# Patient Record
Sex: Female | Born: 1985 | Race: Black or African American | Hispanic: No | Marital: Single | Smoking: Never smoker
Health system: Southern US, Community
[De-identification: ages and names within clinical notes are randomized; demographics above are authoritative.]

## PROBLEM LIST (undated history)

## (undated) DIAGNOSIS — J45909 Unspecified asthma, uncomplicated: Secondary | ICD-10-CM

## (undated) DIAGNOSIS — D649 Anemia, unspecified: Secondary | ICD-10-CM

## (undated) DIAGNOSIS — G43909 Migraine, unspecified, not intractable, without status migrainosus: Secondary | ICD-10-CM

## (undated) DIAGNOSIS — D573 Sickle-cell trait: Secondary | ICD-10-CM

## (undated) HISTORY — PX: ABDOMINAL HYSTERECTOMY: SHX81

## (undated) HISTORY — PX: FOOT TENDON SURGERY: SHX958

---
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
## 2007-03-18 ENCOUNTER — Emergency Department: Payer: Self-pay | Admitting: Internal Medicine

## 2008-03-30 ENCOUNTER — Observation Stay: Payer: Self-pay | Admitting: Obstetrics and Gynecology

## 2008-07-07 ENCOUNTER — Inpatient Hospital Stay: Payer: Self-pay | Admitting: Obstetrics and Gynecology

## 2009-05-05 ENCOUNTER — Emergency Department: Payer: Self-pay | Admitting: Emergency Medicine

## 2009-05-18 ENCOUNTER — Emergency Department: Payer: Self-pay | Admitting: Emergency Medicine

## 2010-07-28 HISTORY — PX: DIAGNOSTIC LAPAROSCOPY: SUR761

## 2010-10-16 ENCOUNTER — Ambulatory Visit: Payer: Self-pay

## 2010-10-22 ENCOUNTER — Ambulatory Visit: Payer: Self-pay

## 2011-12-17 ENCOUNTER — Emergency Department: Payer: Self-pay | Admitting: *Deleted

## 2013-03-08 ENCOUNTER — Emergency Department: Payer: Self-pay | Admitting: Emergency Medicine

## 2013-03-08 LAB — CBC
HGB: 13.8 g/dL (ref 12.0–16.0)
MCHC: 35.8 g/dL (ref 32.0–36.0)
MCV: 90 fL (ref 80–100)
RBC: 4.3 10*6/uL (ref 3.80–5.20)
RDW: 12.2 % (ref 11.5–14.5)

## 2013-03-08 LAB — URINALYSIS, COMPLETE
RBC,UR: 17533 /HPF (ref 0–5)
Squamous Epithelial: 3
WBC UR: 8 /HPF (ref 0–5)

## 2013-03-08 LAB — GC/CHLAMYDIA PROBE AMP

## 2013-05-11 ENCOUNTER — Emergency Department: Payer: Self-pay | Admitting: Emergency Medicine

## 2013-05-11 LAB — URINALYSIS, COMPLETE
Bilirubin,UR: NEGATIVE
Ketone: NEGATIVE
Leukocyte Esterase: NEGATIVE
Squamous Epithelial: <1

## 2014-02-28 ENCOUNTER — Emergency Department: Payer: Self-pay | Admitting: Emergency Medicine

## 2014-04-18 DIAGNOSIS — E669 Obesity, unspecified: Secondary | ICD-10-CM | POA: Insufficient documentation

## 2014-04-18 DIAGNOSIS — R51 Headache: Secondary | ICD-10-CM

## 2014-04-18 DIAGNOSIS — R519 Headache, unspecified: Secondary | ICD-10-CM | POA: Insufficient documentation

## 2014-04-18 HISTORY — DX: Obesity, unspecified: E66.9

## 2014-05-10 DIAGNOSIS — G43909 Migraine, unspecified, not intractable, without status migrainosus: Secondary | ICD-10-CM | POA: Insufficient documentation

## 2014-11-14 ENCOUNTER — Emergency Department
Admit: 2014-11-14 | Disposition: A | Discharge status: Home / Self Care | Payer: Self-pay | Admitting: Emergency Medicine

## 2014-11-14 LAB — CBC WITH DIFFERENTIAL/PLATELET
BASOS ABS: 0 10*3/uL (ref 0.0–0.1)
Basophil %: 0.6 %
Eosinophil #: 0.1 10*3/uL (ref 0.0–0.7)
Eosinophil %: 1.3 %
HCT: 36.3 % (ref 35.0–47.0)
HGB: 12.3 g/dL (ref 12.0–16.0)
LYMPHS PCT: 17.8 %
Lymphocyte #: 1.4 10*3/uL (ref 1.0–3.6)
MCH: 31 pg (ref 26.0–34.0)
MCHC: 33.8 g/dL (ref 32.0–36.0)
MCV: 92 fL (ref 80–100)
MONOS PCT: 8.6 %
Monocyte #: 0.7 x10 3/mm (ref 0.2–0.9)
Neutrophil #: 5.5 10*3/uL (ref 1.4–6.5)
Neutrophil %: 71.7 %
PLATELETS: 194 10*3/uL (ref 150–440)
RBC: 3.96 10*6/uL (ref 3.80–5.20)
RDW: 12.6 % (ref 11.5–14.5)
WBC: 7.6 10*3/uL (ref 3.6–11.0)

## 2014-11-14 LAB — ED INFLUENZA
Influenza A By PCR: NEGATIVE
Influenza B By PCR: NEGATIVE

## 2014-11-17 LAB — BETA STREP CULTURE(ARMC)

## 2015-06-07 ENCOUNTER — Emergency Department: Payer: Medicaid Other

## 2015-06-07 ENCOUNTER — Encounter: Payer: Self-pay | Admitting: Emergency Medicine

## 2015-06-07 ENCOUNTER — Emergency Department
Admission: EM | Admit: 2015-06-07 | Discharge: 2015-06-07 | Disposition: A | Discharge status: Home / Self Care | Payer: Medicaid Other | Attending: Emergency Medicine | Admitting: Emergency Medicine

## 2015-06-07 DIAGNOSIS — R109 Unspecified abdominal pain: Secondary | ICD-10-CM | POA: Insufficient documentation

## 2015-06-07 DIAGNOSIS — G43709 Chronic migraine without aura, not intractable, without status migrainosus: Secondary | ICD-10-CM | POA: Diagnosis not present

## 2015-06-07 DIAGNOSIS — R079 Chest pain, unspecified: Secondary | ICD-10-CM | POA: Diagnosis not present

## 2015-06-07 DIAGNOSIS — M79602 Pain in left arm: Secondary | ICD-10-CM | POA: Diagnosis not present

## 2015-06-07 DIAGNOSIS — G43909 Migraine, unspecified, not intractable, without status migrainosus: Secondary | ICD-10-CM | POA: Diagnosis present

## 2015-06-07 DIAGNOSIS — IMO0002 Reserved for concepts with insufficient information to code with codable children: Secondary | ICD-10-CM

## 2015-06-07 HISTORY — DX: Sickle-cell trait: D57.3

## 2015-06-07 HISTORY — DX: Migraine, unspecified, not intractable, without status migrainosus: G43.909

## 2015-06-07 HISTORY — DX: Unspecified asthma, uncomplicated: J45.909

## 2015-06-07 LAB — CBC
HCT: 37.5 % (ref 35.0–47.0)
Hemoglobin: 12.6 g/dL (ref 12.0–16.0)
MCH: 30.7 pg (ref 26.0–34.0)
MCHC: 33.6 g/dL (ref 32.0–36.0)
MCV: 91.4 fL (ref 80.0–100.0)
PLATELETS: 260 10*3/uL (ref 150–440)
RBC: 4.11 MIL/uL (ref 3.80–5.20)
RDW: 12.5 % (ref 11.5–14.5)
WBC: 4.5 10*3/uL (ref 3.6–11.0)

## 2015-06-07 LAB — COMPREHENSIVE METABOLIC PANEL
ALT: 10 U/L — ABNORMAL LOW (ref 14–54)
AST: 13 U/L — ABNORMAL LOW (ref 15–41)
Albumin: 4 g/dL (ref 3.5–5.0)
Alkaline Phosphatase: 56 U/L (ref 38–126)
Anion gap: 3 — ABNORMAL LOW (ref 5–15)
BILIRUBIN TOTAL: 0.6 mg/dL (ref 0.3–1.2)
BUN: 9 mg/dL (ref 6–20)
CHLORIDE: 109 mmol/L (ref 101–111)
CO2: 25 mmol/L (ref 22–32)
Calcium: 9.1 mg/dL (ref 8.9–10.3)
Creatinine, Ser: 0.89 mg/dL (ref 0.44–1.00)
Glucose, Bld: 81 mg/dL (ref 65–99)
POTASSIUM: 3.8 mmol/L (ref 3.5–5.1)
Sodium: 137 mmol/L (ref 135–145)
TOTAL PROTEIN: 7.1 g/dL (ref 6.5–8.1)

## 2015-06-07 LAB — TROPONIN I

## 2015-06-07 MED ORDER — KETOROLAC TROMETHAMINE 60 MG/2ML IM SOLN
60.0000 mg | Freq: Once | INTRAMUSCULAR | Status: AC
  Administered 2015-06-07: 60 mg via INTRAMUSCULAR
  Filled 2015-06-07: qty 2

## 2015-06-07 MED ORDER — KETOROLAC TROMETHAMINE 30 MG/ML IJ SOLN
30.0000 mg | Freq: Once | INTRAMUSCULAR | Status: DC
  Filled 2015-06-07: qty 1

## 2015-06-07 NOTE — ED Notes (Signed)
pt c/o migrane to left side, left sided chest pain, left sided abdominal pain and left arm pain X 2 days. Pt hx of migraines, sees a neurologist but "feels like a [place] with medications". Pt states that no medications she is taking are helping her migraines. Pt alert and oriented X4, active, cooperative, pt in NAD. RR even and unlabored, color WNL.  Pt blinking rapidly during conversation.

## 2015-06-07 NOTE — Discharge Instructions (Signed)
As we discussed your CT scan and lab work is all reassuring.  We recommend that you follow up with Dr. Malvin JohnsPotter at the next available opportunity and continue taking your regular medications.  Return to the emergency department with new or worsening symptoms that concern you.   Recurrent Migraine Headache A migraine headache is an intense, throbbing pain on one or both sides of your head. Recurrent migraines keep coming back. A migraine can last for 30 minutes to several hours. CAUSES  The exact cause of a migraine headache is not always known. However, a migraine may be caused when nerves in the brain become irritated and release chemicals that cause inflammation. This causes pain. Certain things may also trigger migraines, such as:   Alcohol.  Smoking.  Stress.  Menstruation.  Aged cheeses.  Foods or drinks that contain nitrates, glutamate, aspartame, or tyramine.  Lack of sleep.  Chocolate.  Caffeine.  Hunger.  Physical exertion.  Fatigue.  Medicines used to treat chest pain (nitroglycerine), birth control pills, estrogen, and some blood pressure medicines. SYMPTOMS   Pain on one or both sides of your head.  Pulsating or throbbing pain.  Severe pain that prevents daily activities.  Pain that is aggravated by any physical activity.  Nausea, vomiting, or both.  Dizziness.  Pain with exposure to bright lights, loud noises, or activity.  General sensitivity to bright lights, loud noises, or smells. Before you get a migraine, you may get warning signs that a migraine is coming (aura). An aura may include:  Seeing flashing lights.  Seeing bright spots, halos, or zigzag lines.  Having tunnel vision or blurred vision.  Having feelings of numbness or tingling.  Having trouble talking.  Having muscle weakness. DIAGNOSIS  A recurrent migraine headache is often diagnosed based on:  Symptoms.  Physical examination.  A CT scan or MRI of your head. These  imaging tests cannot diagnose migraines but can help rule out other causes of headaches.  TREATMENT  Medicines may be given for pain and nausea. Medicines can also be given to help prevent recurrent migraines. HOME CARE INSTRUCTIONS  Only take over-the-counter or prescription medicines for pain or discomfort as directed by your health care provider. The use of long-term narcotics is not recommended.  Lie down in a dark, quiet room when you have a migraine.  Keep a journal to find out what may trigger your migraine headaches. For example, write down:  What you eat and drink.  How much sleep you get.  Any change to your diet or medicines.  Limit alcohol consumption.  Quit smoking if you smoke.  Get 7-9 hours of sleep, or as recommended by your health care provider.  Limit stress.  Keep lights dim if bright lights bother you and make your migraines worse. SEEK MEDICAL CARE IF:   You do not get relief from the medicines given to you.  You have a recurrence of pain.  You have a fever. SEEK IMMEDIATE MEDICAL CARE IF:  Your migraine becomes severe.  You have a stiff neck.  You have loss of vision.  You have muscular weakness or loss of muscle control.  You start losing your balance or have trouble walking.  You feel faint or pass out.  You have severe symptoms that are different from your first symptoms. MAKE SURE YOU:   Understand these instructions.  Will watch your condition.  Will get help right away if you are not doing well or get worse.   This information  is not intended to replace advice given to you by your health care provider. Make sure you discuss any questions you have with your health care provider.   Document Released: 04/08/2001 Document Revised: 08/04/2014 Document Reviewed: 03/21/2013 Elsevier Interactive Patient Education [employer].

## 2015-06-07 NOTE — ED Notes (Signed)
Patient transported to CT 

## 2015-06-07 NOTE — ED Provider Notes (Signed)
[facility] Emergency Department Provider Note  ____________________________________________  Time seen: Approximately 7:02 PM  I have reviewed the triage vital signs and the nursing notes.   HISTORY  Chief Complaint Migraine; Chest Pain; and Abdominal Pain    HPI Danielle Pollard is a 29 y.o. female who presents with chronic migraines.  She states that she has been suffering from migraines since she was 29 years old.  She sees Dr. Malvin Johns who has been trying multiple different medications to control her chronic headaches but she says that nothing is working.  She states that she is here today not because anything is new or worse than usual, but because she is frustrated and just "wanted to know what is going on".  She says that she has a headache every day and occasional episodes of nausea and vomiting.  She has been taking the medications that Dr. Malvin Johns as prescribed.  Nothing makes them better and nothing makes them worse set that she says that being a single parent causes extra stress that seems to exacerbate the headaches.  She describes them as a global, mild to severe in intensity, and persistent for "a long time".  She also states almost incidentally that she is occasionally feeling some pain on the left side of her body, including her chest, arm, and abdomen.   Past Medical History  Diagnosis Date  . Asthma   . Migraines   . Sickle cell trait (HCC)     There are no active problems to display for this patient.   Past Surgical History  Procedure Laterality Date  . Cesarean section      No current outpatient prescriptions on file.  Allergies Review of patient's allergies indicates no known allergies.  No family history on file.  Social History Social History  Substance Use Topics  . Smoking status: Never Smoker   . Smokeless tobacco: None  . Alcohol Use: No    Review of Systems Constitutional: No fever/chills Eyes: No visual  changes. ENT: No sore throat. Cardiovascular: Denies chest pain. Respiratory: Denies shortness of breath. Gastrointestinal: No abdominal pain.  Occasional vomiting associated with the headaches.  No diarrhea.  No constipation. Genitourinary: Negative for dysuria. Musculoskeletal: Negative for back pain. Skin: Negative for rash. Neurological: Chronic/constant migraines, global, severe.  10-point ROS otherwise negative.  ____________________________________________   PHYSICAL EXAM:  VITAL SIGNS: ED Triage Vitals  Enc Vitals Group     BP 06/07/15 1635 132/85 mmHg     Pulse Rate 06/07/15 1635 79     Resp 06/07/15 1635 18     Temp 06/07/15 1635 98.4 F (36.9 C)     Temp Source 06/07/15 1635 Oral     SpO2 06/07/15 1635 100 %     Weight 06/07/15 1635 176 lb (79.833 kg)     Height 06/07/15 1635  (1.626 m)     Head Cir --      Peak Flow --      Pain Score 06/07/15 1636 10     Pain Loc --      Pain Edu? --      Excl. in GC? --     Constitutional: Alert and oriented. Well appearing and in no acute distress. Eyes: Conjunctivae are normal. PERRL. EOMI. Head: Atraumatic. Nose: No congestion/rhinnorhea. Mouth/Throat: Mucous membranes are moist.  Oropharynx non-erythematous. Neck: No stridor.   Cardiovascular: Normal rate, regular rhythm. Grossly normal heart sounds.  Good peripheral circulation. Respiratory: Normal respiratory effort.  No retractions. Lungs  CTAB. Gastrointestinal: Soft and nontender. No distention. No abdominal bruits. No CVA tenderness. Musculoskeletal: No lower extremity tenderness nor edema.  No joint effusions. Neurologic:  Normal speech and language. No gross focal neurologic deficits are appreciated.  Skin:  Skin is warm, dry and intact. No rash noted. Psychiatric: Mood and affect are normal. Speech and behavior are normal.  ____________________________________________   LABS (all labs ordered are listed, but only abnormal results are  displayed)  Labs Reviewed  COMPREHENSIVE METABOLIC PANEL - Abnormal; Notable for the following:    AST 13 (*)    ALT 10 (*)    Anion gap 3 (*)    All other components within normal limits  CBC  TROPONIN I   ____________________________________________  EKG  ED ECG REPORT I, Fraida Veldman, the attending physician, personally viewed and interpreted this ECG.  Date: 06/07/2015 EKG Time: 16:35 Rate: 75 Rhythm: normal sinus rhythm QRS Axis: normal Intervals: normal ST/T Wave abnormalities: Inverted T-wave in lead 3, otherwise unremarkable Conduction Disutrbances: none Narrative Interpretation: unremarkable  ____________________________________________  RADIOLOGY   Ct Head Wo Contrast  06/07/2015  CLINICAL DATA:  Migraine headache for 2 days. EXAM: CT HEAD WITHOUT CONTRAST TECHNIQUE: Contiguous axial images were obtained from the base of the skull through the vertex without intravenous contrast. COMPARISON:  12/17/2011 FINDINGS: Gray-white differentiation is maintained. No CT evidence of acute large territory infarct. No intraparenchymal or extra-axial mass or hemorrhage. Unchanged size and configuration of the ventricles and basilar cisterns. No midline shift. Limited visualization of the paranasal sinuses and mastoid air cells is normal. No air-fluid levels. Regional soft tissues appear normal. No displaced calvarial fracture. IMPRESSION: Negative noncontrast head CT. Electronically Signed   By: Simonne ComeJohn  Watts M.D.   On: 06/07/2015 19:33    ____________________________________________   PROCEDURES  Procedure(s) performed: None  Critical Care performed: No ____________________________________________   INITIAL IMPRESSION / ASSESSMENT AND PLAN / ED COURSE  Pertinent labs & imaging results that were available during my care of the patient were reviewed by me and considered in my medical decision making (see chart for details).  The patient states that she is worried that  something is going on inside her head "like a tumor" because she has not had any imaging throughout all the different treatments.  I think it is reasonable to obtain a CT scan of her head and we will do so here tonight.  I will treat her with Toradol for her headache but she drove herself here and has her son with her and I am not comfortable giving her the full migraine cocktail which frequently results and somnolence; I think it might be dangerous for her to drive her and her son home afterwards.  I explained this to the patient and she understands and agrees.  If her CT scan is unremarkable she can follow-up with Dr. Malvin JohnsPotter.  I explained the chronic nature of the symptoms and it is unlikely we would identify a cause in the emergency department today.  She has no focal neurological deficits, no visual disturbances, or other symptoms/signs that would lead me to believe she has an acute or emergent medical condition.  ____________________________________________  FINAL CLINICAL IMPRESSION(S) / ED DIAGNOSES  Final diagnoses:  Chronic migraine      NEW MEDICATIONS STARTED DURING THIS VISIT:  New Prescriptions   No medications on file     Loleta Roseory Aviana Shevlin, MD 06/07/15 2020

## 2015-06-07 NOTE — ED Notes (Signed)
Pt states no chance of being pregnant

## 2015-06-07 NOTE — ED Notes (Signed)
Pt to ED with c/o migraine headache x 2 day with n,v, hx of migraines, taking migraine medication with no relief, also states yesterday she started having left sided chest pain and lightheadiness

## 2015-09-09 ENCOUNTER — Emergency Department
Admission: EM | Admit: 2015-09-09 | Discharge: 2015-09-09 | Disposition: A | Discharge status: Home / Self Care | Payer: Medicaid Other | Attending: Emergency Medicine | Admitting: Emergency Medicine

## 2015-09-09 ENCOUNTER — Encounter: Payer: Self-pay | Admitting: *Deleted

## 2015-09-09 DIAGNOSIS — N390 Urinary tract infection, site not specified: Secondary | ICD-10-CM | POA: Diagnosis not present

## 2015-09-09 DIAGNOSIS — R1030 Lower abdominal pain, unspecified: Secondary | ICD-10-CM | POA: Diagnosis present

## 2015-09-09 DIAGNOSIS — B349 Viral infection, unspecified: Secondary | ICD-10-CM | POA: Diagnosis not present

## 2015-09-09 LAB — CBC
HCT: 37.8 % (ref 35.0–47.0)
HEMOGLOBIN: 13 g/dL (ref 12.0–16.0)
MCH: 31 pg (ref 26.0–34.0)
MCHC: 34.4 g/dL (ref 32.0–36.0)
MCV: 90.1 fL (ref 80.0–100.0)
PLATELETS: 217 10*3/uL (ref 150–440)
RBC: 4.2 MIL/uL (ref 3.80–5.20)
RDW: 12 % (ref 11.5–14.5)
WBC: 7.2 10*3/uL (ref 3.6–11.0)

## 2015-09-09 LAB — URINALYSIS COMPLETE WITH MICROSCOPIC (ARMC ONLY)
BILIRUBIN URINE: NEGATIVE
Glucose, UA: NEGATIVE mg/dL
Hgb urine dipstick: NEGATIVE
KETONES UR: NEGATIVE mg/dL
NITRITE: NEGATIVE
PH: 6 (ref 5.0–8.0)
Protein, ur: NEGATIVE mg/dL
Specific Gravity, Urine: 1.014 (ref 1.005–1.030)

## 2015-09-09 LAB — COMPREHENSIVE METABOLIC PANEL
ALBUMIN: 3.8 g/dL (ref 3.5–5.0)
ALK PHOS: 56 U/L (ref 38–126)
ALT: 8 U/L — AB (ref 14–54)
ANION GAP: 7 (ref 5–15)
AST: 13 U/L — ABNORMAL LOW (ref 15–41)
BILIRUBIN TOTAL: 0.5 mg/dL (ref 0.3–1.2)
BUN: 6 mg/dL (ref 6–20)
CALCIUM: 9 mg/dL (ref 8.9–10.3)
CO2: 23 mmol/L (ref 22–32)
CREATININE: 0.91 mg/dL (ref 0.44–1.00)
Chloride: 108 mmol/L (ref 101–111)
GFR calc Af Amer: >60 mL/min (ref >60)
GFR calc non Af Amer: >60 mL/min (ref >60)
GLUCOSE: 91 mg/dL (ref 65–99)
Potassium: 3.7 mmol/L (ref 3.5–5.1)
Sodium: 138 mmol/L (ref 135–145)
TOTAL PROTEIN: 7.5 g/dL (ref 6.5–8.1)

## 2015-09-09 LAB — LIPASE, BLOOD: Lipase: 17 U/L (ref 11–51)

## 2015-09-09 MED ORDER — ONDANSETRON 4 MG PO TBDP
4.0000 mg | ORAL_TABLET | Freq: Once | ORAL | Status: AC | PRN
  Administered 2015-09-09: 4 mg via ORAL

## 2015-09-09 MED ORDER — ONDANSETRON 4 MG PO TBDP
ORAL_TABLET | ORAL | Status: AC
  Filled 2015-09-09: qty 1

## 2015-09-09 MED ORDER — ONDANSETRON 4 MG PO TBDP: 4.0000 mg | ORAL_TABLET | Freq: Three times a day (TID) | ORAL | Status: DC | PRN

## 2015-09-09 MED ORDER — CEPHALEXIN 500 MG PO CAPS: 500.0000 mg | ORAL_CAPSULE | Freq: Three times a day (TID) | ORAL | Status: DC

## 2015-09-09 NOTE — ED Notes (Addendum)
States body aches, chills, headache, congestion, and fever and vomiting since Friday, states abd pain since yesterday

## 2015-09-09 NOTE — ED Provider Notes (Addendum)
[facility] Emergency Department Provider Note  Time seen: 4:33 PM  I have reviewed the triage vital signs and the nursing notes.   HISTORY  Chief Complaint Generalized Body Aches; Chills; Emesis; and Abdominal Pain    HPI Danielle Pollard is a 30 y.o. female with a past medical history of asthma, migraines, presents the emergency department with 3 days of subjective fevers, chills, nausea, nasal congestion, and some lower abdominal pain. According to the patient for the past 3 days she has been having subjective fevers/chills, nausea and vomiting with nasal congestion and a sore throat. Patient also states since yesterday she has been having some lower abdominal discomfort. Denies diarrhea. Denies vomiting. Denies dysuria. Describes her abdominal pain is mild, cramping sensation to the lower abdomen. Scratch her sore throat is mild, worse when she swallows.     Past Medical History  Diagnosis Date  . Asthma   . Migraines   . Sickle cell trait (HCC)     There are no active problems to display for this patient.   Past Surgical History  Procedure Laterality Date  . Cesarean section      No current outpatient prescriptions on file.  Allergies Review of patient's allergies indicates no known allergies.  History reviewed. No pertinent family history.  Social History Social History  Substance Use Topics  . Smoking status: Never Smoker   . Smokeless tobacco: None  . Alcohol Use: No    Review of Systems Constitutional: Negative for fever. ENT: Positive for nasal congestion. Cardiovascular: Negative for chest pain. Respiratory: Negative for shortness of breath. Negative for cough. Gastrointestinal: Mild lower abdominal pain. Positive for nausea. Negative for vomiting or diarrhea. Genitourinary: Negative for dysuria. Neurological: Mild headache. 10-point ROS otherwise negative.  ____________________________________________   PHYSICAL  EXAM:  VITAL SIGNS: ED Triage Vitals  Enc Vitals Group     BP 09/09/15 1450 129/73 mmHg     Pulse Rate 09/09/15 1450 81     Resp 09/09/15 1450 18     Temp 09/09/15 1450 98.5 F (36.9 C)     Temp Source 09/09/15 1450 Oral     SpO2 09/09/15 1450 98 %     Weight 09/09/15 1450 180 lb (81.647 kg)     Height 09/09/15 1450  (1.651 m)     Head Cir --      Peak Flow --      Pain Score 09/09/15 1451 10     Pain Loc --      Pain Edu? --      Excl. in GC? --     Constitutional: Alert and oriented. Well appearing and in no distress. Eyes: Normal exam ENT   Head: Normocephalic and atraumatic.   Nose: Mild congestion.   Mouth/Throat: Mucous membranes are moist. No pharyngeal erythema or hypertrophy. Cardiovascular: Normal rate, regular rhythm. No murmur Respiratory: Normal respiratory effort without tachypnea nor retractions. Breath sounds are clear Gastrointestinal: Soft, slight suprapubic tenderness palpation. No rebound or guarding. No distention. No CVA tenderness. Musculoskeletal: Nontender with normal range of motion in all extremities. Neurologic:  Normal speech and language. No gross focal neurologic deficits Skin:  Skin is warm, dry and intact.  Psychiatric: Mood and affect are normal. Speech and behavior are normal.   ____________________________________________    INITIAL IMPRESSION / ASSESSMENT AND PLAN / ED COURSE  Pertinent labs & imaging results that were available during my care of the patient were reviewed by me and considered in my medical  decision making (see chart for details).  Patient presents with 3 days of subjective fevers/chills, nausea, some lower abdominal discomfort and nasal congestion with sore throat. Patient's symptoms are most suggestive of a viral process likely upper respiratory infection. Patient's labs show many bacteria, possible contamination urinalysis however given her mild lower abdominal pain we will discharge with antibiotic  cover for urinary tract infection. I sent a urine culture to verify. We will treat the patient's nausea was Zofran as needed, suspect viral process discussed with the patient drinking plenty of fluids, Tylenol or Motrin as needed for discomfort and obtaining plain press. Patient is agreeable.  ____________________________________________   FINAL CLINICAL IMPRESSION(S) / ED DIAGNOSES  Viral syndrome UTI   Minna Antis, MD 09/09/15 1636  Minna Antis, MD 09/09/15 [phone]

## 2015-09-09 NOTE — Discharge Instructions (Signed)
Viral Infections A viral infection can be caused by different types of viruses.Most viral infections are not serious and resolve on their own. However, some infections may cause severe symptoms and may lead to further complications. SYMPTOMS Viruses can frequently cause:  Minor sore throat.  Aches and pains.  Headaches.  Runny nose.  Different types of rashes.  Watery eyes.  Tiredness.  Cough.  Loss of appetite.  Gastrointestinal infections, resulting in nausea, vomiting, and diarrhea. These symptoms do not respond to antibiotics because the infection is not caused by bacteria. However, you might catch a bacterial infection following the viral infection. This is sometimes called a "superinfection." Symptoms of such a bacterial infection may include:  Worsening sore throat with pus and difficulty swallowing.  Swollen neck glands.  Chills and a high or persistent fever.  Severe headache.  Tenderness over the sinuses.  Persistent overall ill feeling (malaise), muscle aches, and tiredness (fatigue).  Persistent cough.  Yellow, green, or brown mucus production with coughing. HOME CARE INSTRUCTIONS   Only take over-the-counter or prescription medicines for pain, discomfort, diarrhea, or fever as directed by your caregiver.  Drink enough water and fluids to keep your urine clear or pale yellow. Sports drinks can provide valuable electrolytes, sugars, and hydration.  Get plenty of rest and maintain proper nutrition. Soups and broths with crackers or rice are fine. SEEK IMMEDIATE MEDICAL CARE IF:   You have severe headaches, shortness of breath, chest pain, neck pain, or an unusual rash.  You have uncontrolled vomiting, diarrhea, or you are unable to keep down fluids.  You or your child has an oral temperature above 102 F (38.9 C), not controlled by medicine.  Your baby is older than 3 months with a rectal temperature of 102 F (38.9 C) or higher.  Your baby is 50  months old or younger with a rectal temperature of 100.4 F (38 C) or higher. MAKE SURE YOU:   Understand these instructions.  Will watch your condition.  Will get help right away if you are not doing well or get worse.   This information is not intended to replace advice given to you by your health care provider. Make sure you discuss any questions you have with your health care provider.   Document Released: 04/23/2005 Document Revised: 10/06/2011 Document Reviewed: 12/20/2014 Elsevier Interactive Patient Education 2016 [employer]  Urinary Tract Infection Urinary tract infections (UTIs) can develop anywhere along your urinary tract. Your urinary tract is your body's drainage system for removing wastes and extra water. Your urinary tract includes two kidneys, two ureters, a bladder, and a urethra. Your kidneys are a pair of bean-shaped organs. Each kidney is about the size of your fist. They are located below your ribs, one on each side of your spine. CAUSES Infections are caused by microbes, which are microscopic organisms, including fungi, viruses, and bacteria. These organisms are so small that they can only be seen through a microscope. Bacteria are the microbes that most commonly cause UTIs. SYMPTOMS  Symptoms of UTIs may vary by age and gender of the patient and by the location of the infection. Symptoms in young women typically include a frequent and intense urge to urinate and a painful, burning feeling in the bladder or urethra during urination. Older women and men are more likely to be tired, shaky, and weak and have muscle aches and abdominal pain. A fever may mean the infection is in your kidneys. Other symptoms of a kidney infection include pain in  your back or sides below the ribs, nausea, and vomiting. DIAGNOSIS To diagnose a UTI, your caregiver will ask you about your symptoms. Your caregiver will also ask you to provide a urine sample. The urine sample will be tested for  bacteria and white blood cells. White blood cells are made by your body to help fight infection. TREATMENT  Typically, UTIs can be treated with medication. Because most UTIs are caused by a bacterial infection, they usually can be treated with the use of antibiotics. The choice of antibiotic and length of treatment depend on your symptoms and the type of bacteria causing your infection. HOME CARE INSTRUCTIONS  If you were prescribed antibiotics, take them exactly as your caregiver instructs you. Finish the medication even if you feel better after you have only taken some of the medication.  Drink enough water and fluids to keep your urine clear or pale yellow.  Avoid caffeine, tea, and carbonated beverages. They tend to irritate your bladder.  Empty your bladder often. Avoid holding urine for long periods of time.  Empty your bladder before and after sexual intercourse.  After a bowel movement, women should cleanse from front to back. Use each tissue only once. SEEK MEDICAL CARE IF:   You have back pain.  You develop a fever.  Your symptoms do not begin to resolve within 3 days. SEEK IMMEDIATE MEDICAL CARE IF:   You have severe back pain or lower abdominal pain.  You develop chills.  You have nausea or vomiting.  You have continued burning or discomfort with urination. MAKE SURE YOU:   Understand these instructions.  Will watch your condition.  Will get help right away if you are not doing well or get worse.   This information is not intended to replace advice given to you by your health care provider. Make sure you discuss any questions you have with your health care provider.   Document Released: 04/23/2005 Document Revised: 04/04/2015 Document Reviewed: 08/22/2011 Elsevier Interactive Patient Education [employer]

## 2015-12-12 ENCOUNTER — Emergency Department
Admission: EM | Admit: 2015-12-12 | Discharge: 2015-12-12 | Disposition: A | Discharge status: Home / Self Care | Payer: Medicaid Other | Attending: Student | Admitting: Student

## 2015-12-12 ENCOUNTER — Encounter: Payer: Self-pay | Admitting: Emergency Medicine

## 2015-12-12 DIAGNOSIS — J45909 Unspecified asthma, uncomplicated: Secondary | ICD-10-CM | POA: Insufficient documentation

## 2015-12-12 DIAGNOSIS — L209 Atopic dermatitis, unspecified: Secondary | ICD-10-CM | POA: Diagnosis not present

## 2015-12-12 DIAGNOSIS — R21 Rash and other nonspecific skin eruption: Secondary | ICD-10-CM | POA: Diagnosis present

## 2015-12-12 MED ORDER — HYDROCORTISONE 0.5 % EX CREA: 1.0000 "application " | TOPICAL_CREAM | Freq: Two times a day (BID) | CUTANEOUS | Status: DC

## 2015-12-12 MED ORDER — HYDROXYZINE PAMOATE 25 MG PO CAPS: 25.0000 mg | ORAL_CAPSULE | Freq: Three times a day (TID) | ORAL | Status: DC | PRN

## 2015-12-12 MED ORDER — METHYLPREDNISOLONE SODIUM SUCC 125 MG IJ SOLR
125.0000 mg | Freq: Once | INTRAMUSCULAR | Status: AC
  Administered 2015-12-12: 125 mg via INTRAMUSCULAR
  Filled 2015-12-12: qty 2

## 2015-12-12 MED ORDER — PREDNISONE 10 MG PO TABS: 50.0000 mg | ORAL_TABLET | Freq: Every day | ORAL | Status: DC

## 2015-12-12 NOTE — ED Provider Notes (Signed)
[facility] Emergency Department Provider Note  ____________________________________________  Time seen: Approximately 6:47 PM  I have reviewed the triage vital signs and the nursing notes.   HISTORY  Chief Complaint Rash    HPI Danielle Pollard is a 30 y.o. female who presents for evaluation of a rash about a month with itching. Denies any new foods. Thinks her mom may change detergents on her. Has tried Benadryl and right ear ointment over-the-counter with no relief. States the itching is constant keeping her awake at nighttime.   Past Medical History  Diagnosis Date  . Asthma   . Migraines   . Sickle cell trait (HCC)     There are no active problems to display for this patient.   Past Surgical History  Procedure Laterality Date  . Cesarean section      Current Outpatient Rx  Name  Route  Sig  Dispense  Refill  . hydrocortisone cream 0.5 %   Topical   Apply 1 application topically 2 (two) times daily.   30 g   0   . hydrOXYzine (VISTARIL) 25 MG capsule   Oral   Take 1 capsule (25 mg total) by mouth 3 (three) times daily as needed.   30 capsule   0   . predniSONE (DELTASONE) 10 MG tablet   Oral   Take 5 tablets (50 mg total) by mouth daily with breakfast.   25 tablet   0     Allergies Review of patient's allergies indicates no known allergies.  No family history on file.  Social History Social History  Substance Use Topics  . Smoking status: Never Smoker   . Smokeless tobacco: None  . Alcohol Use: No    Review of Systems Constitutional: No fever/chills Cardiovascular: Denies chest pain. Respiratory: Denies shortness of breath. Skin: Positive for rash. Neurological: Negative for headaches, focal weakness or numbness.  10-point ROS otherwise negative.  ____________________________________________   PHYSICAL EXAM:  VITAL SIGNS: ED Triage Vitals  Enc Vitals Group     BP 12/12/15 1839 148/93 mmHg     Pulse  Rate 12/12/15 1839 77     Resp 12/12/15 1839 18     Temp 12/12/15 1839 97.9 F (36.6 C)     Temp Source 12/12/15 1839 Oral     SpO2 12/12/15 1839 99 %     Weight 12/12/15 1839 180 lb (81.647 kg)     Height 12/12/15 1839  (1.651 m)     Head Cir --      Peak Flow --      Pain Score --      Pain Loc --      Pain Edu? --      Excl. in GC? --     Constitutional: Alert and oriented. Well appearing and in no acute distress. Cardiovascular: Normal rate, regular rhythm. Grossly normal heart sounds.  Good peripheral circulation. Respiratory: Normal respiratory effort.  No retractions. Lungs CTAB. Gastrointestinal: Soft and nontender. No distention. No abdominal bruits. No CVA tenderness. Musculoskeletal: No lower extremity tenderness nor edema.  No joint effusions. Neurologic:  Normal speech and language. No gross focal neurologic deficits are appreciated. No gait instability. Skin:  Skin is warm, dry and intact.Very fine pruritic papular nonvesicular rash noted on the arms legs and trunk. Psychiatric: Mood and affect are normal. Speech and behavior are normal.  ____________________________________________   LABS (all labs ordered are listed, but only abnormal results are displayed)  Labs Reviewed - No  data to display ____________________________________________     PROCEDURES  Procedure(s) performed: None  Critical Care performed: No  ____________________________________________   INITIAL IMPRESSION / ASSESSMENT AND PLAN / ED COURSE  Pertinent labs & imaging results that were available during my care of the patient were reviewed by me and considered in my medical decision making (see chart for details).  Atopic dermatitis. Rx given for prednisone daily 5 days. Vistaril and hydrocortisone cream as needed for itching. Patient follow-up with PCP or return to the ER with any worsening symptomology. ____________________________________________   FINAL CLINICAL  IMPRESSION(S) / ED DIAGNOSES  Final diagnoses:  Atopic dermatitis     This chart was dictated using voice recognition software/Dragon. Despite best efforts to proofread, errors can occur which can change the meaning. Any change was purely unintentional.   Evangeline Dakinharles M Star Cheese, PA-C 12/12/15 1912  Gayla DossEryka A Gayle, MD 12/12/15 [phone]

## 2015-12-12 NOTE — ED Notes (Signed)
Fine rash for about 1 month positive itching   No resp distress

## 2015-12-12 NOTE — ED Notes (Signed)
AAOx3.  Skin warm and dry.  NAD 

## 2015-12-12 NOTE — ED Notes (Signed)
Called pt from lobby to go to a flex room, and walked around no answer from pt

## 2015-12-12 NOTE — ED Notes (Signed)
Pt presents with fine red rash to arms, legs, and back. Pt reports rash is itching. Pt denies use of any new products or medications. Pt in no apparent distress in triage.

## 2015-12-12 NOTE — Discharge Instructions (Signed)

## 2016-02-08 ENCOUNTER — Emergency Department
Admission: EM | Admit: 2016-02-08 | Discharge: 2016-02-08 | Disposition: A | Discharge status: Home / Self Care | Payer: Medicaid Other | Attending: Emergency Medicine | Admitting: Emergency Medicine

## 2016-02-08 ENCOUNTER — Encounter: Payer: Self-pay | Admitting: Emergency Medicine

## 2016-02-08 DIAGNOSIS — R05 Cough: Secondary | ICD-10-CM | POA: Diagnosis present

## 2016-02-08 DIAGNOSIS — J069 Acute upper respiratory infection, unspecified: Secondary | ICD-10-CM | POA: Diagnosis not present

## 2016-02-08 DIAGNOSIS — J45909 Unspecified asthma, uncomplicated: Secondary | ICD-10-CM | POA: Insufficient documentation

## 2016-02-08 MED ORDER — BENZONATATE 100 MG PO CAPS: ORAL_CAPSULE | ORAL | Status: DC

## 2016-02-08 NOTE — ED Notes (Signed)
Pt c/o shortness of breath, sore throat, pain with inspiration, cough and chills since Monday.  Pt states last weekend she was throwing up and thought she was pregnant but pt states she started her period and took a pregnancy test that was negative.

## 2016-02-08 NOTE — Discharge Instructions (Signed)
Upper Respiratory Infection, Adult Most upper respiratory infections (URIs) are caused by a virus. A URI affects the nose, throat, and upper air passages. The most common type of URI is often called "the common cold." HOME CARE   Take medicines only as told by your doctor.  Gargle warm saltwater or take cough drops to comfort your throat as told by your doctor.  Use a warm mist humidifier or inhale steam from a shower to increase air moisture. This may make it easier to breathe.  Drink enough fluid to keep your pee (urine) clear or pale yellow.  Eat soups and other clear broths.  Have a healthy diet.  Rest as needed.  Go back to work when your fever is gone or your doctor says it is okay.  You may need to stay home longer to avoid giving your URI to others.  You can also wear a face mask and wash your hands often to prevent spread of the virus.  Use your inhaler more if you have asthma.  Do not use any tobacco products, including cigarettes, chewing tobacco, or electronic cigarettes. If you need help quitting, ask your doctor. GET HELP IF:  You are getting worse, not better.  Your symptoms are not helped by medicine.  You have chills.  You are getting more short of breath.  You have brown or red mucus.  You have yellow or brown discharge from your nose.  You have pain in your face, especially when you bend forward.  You have a fever.  You have puffy (swollen) neck glands.  You have pain while swallowing.  You have white areas in the back of your throat. GET HELP RIGHT AWAY IF:   You have very bad or constant:  Headache.  Ear pain.  Pain in your forehead, behind your eyes, and over your cheekbones (sinus pain).  Chest pain.  You have long-lasting (chronic) lung disease and any of the following:  Wheezing.  Long-lasting cough.  Coughing up blood.  A change in your usual mucus.  You have a stiff neck.  You have changes in  your:  Vision.  Hearing.  Thinking.  Mood. MAKE SURE YOU:   Understand these instructions.  Will watch your condition.  Will get help right away if you are not doing well or get worse.   This information is not intended to replace advice given to you by your health care provider. Make sure you discuss any questions you have with your health care provider.   Document Released: 12/31/2007 Document Revised: 11/28/2014 Document Reviewed: 10/19/2013 Elsevier Interactive Patient Education 2016 [employer]    Follow-up with your doctor at [facility] if any continued problems. Increase fluids. Tylenol or ibuprofen as needed for discomfort. Obtain antihistamine over-the-counter such as Zyrtec or Claritin and take daily. Begin taking Tessalon 1 or 2 every 8 hours as needed for cough.

## 2016-02-08 NOTE — ED Provider Notes (Signed)
[facility] Emergency Department Provider Note  ____________________________________________  Time seen: Approximately 8:53 AM  I have reviewed the triage vital signs and the nursing notes.   HISTORY  Chief Complaint URI   HPI Danielle Pollard is a 30 y.o. female is here complaining of sore throat and cough and congestion. Patient states that she began having symptoms for days ago with nonproductive cough and chills. She states that she is now coughed so much that her voice is hoarse. She also began throwing up over the weekend and thought she may be pregnant however she is now started her menses and her pregnancy test at home was negative. She has not taken any over-the-counter medication for her cough or nasal congestion. The aware of any actual fever. She denies any history of asthma and denies wheezing. Patient has never been a smoker. Currently she rates her pain is 7/10.   Past Medical History  Diagnosis Date  . Asthma   . Migraines   . Sickle cell trait (HCC)     There are no active problems to display for this patient.   Past Surgical History  Procedure Laterality Date  . Cesarean section      Current Outpatient Rx  Name  Route  Sig  Dispense  Refill  . benzonatate (TESSALON) 100 MG capsule      Take one or 2 tablets every 8 hours as needed for cough.   30 capsule   0   . hydrocortisone cream 0.5 %   Topical   Apply 1 application topically 2 (two) times daily.   30 g   0   . hydrOXYzine (VISTARIL) 25 MG capsule   Oral   Take 1 capsule (25 mg total) by mouth 3 (three) times daily as needed.   30 capsule   0   . predniSONE (DELTASONE) 10 MG tablet   Oral   Take 5 tablets (50 mg total) by mouth daily with breakfast.   25 tablet   0     Allergies Review of patient's allergies indicates no known allergies.  No family history on file.  Social History Social History  Substance Use Topics  . Smoking status: Never  Smoker   . Smokeless tobacco: None  . Alcohol Use: No    Review of Systems Constitutional: Subjective fever/chills ENT: Positive sore throat. Cardiovascular: Denies chest pain. Respiratory: Positive shortness of breath with coughing. Nonproductive cough. Gastrointestinal: No abdominal pain.  No nausea, no vomiting.  Musculoskeletal: Negative for back pain. Skin: Negative for rash. Neurological: Negative for headaches, focal weakness or numbness.  10-point ROS otherwise negative.  ____________________________________________   PHYSICAL EXAM:  VITAL SIGNS: ED Triage Vitals  Enc Vitals Group     BP 02/08/16 0839 134/84 mmHg     Pulse Rate 02/08/16 0839 78     Resp 02/08/16 0839 20     Temp 02/08/16 0839 98.6 F (37 C)     Temp Source 02/08/16 0839 Oral     SpO2 02/08/16 0839 99 %     Weight 02/08/16 0839 180 lb (81.647 kg)     Height 02/08/16 0839  (1.651 m)     Head Cir --      Peak Flow --      Pain Score 02/08/16 0840 7     Pain Loc --      Pain Edu? --      Excl. in GC? --     Constitutional: Alert and oriented.  Well appearing and in no acute distress. Eyes: Conjunctivae are normal. PERRL. EOMI. Head: Atraumatic. Nose: Mild congestion/rhinnorhea.  EACs are clear bilaterally. TMs are dull bilaterally but without injection or erythema. Mouth/Throat: Mucous membranes are moist.  Oropharynx non-erythematous. Neck: No stridor.   Hematological/Lymphatic/Immunilogical: No cervical lymphadenopathy. Cardiovascular: Normal rate, regular rhythm. Grossly normal heart sounds.  Good peripheral circulation. Respiratory: Normal respiratory effort.  No retractions. Lungs CTAB. Gastrointestinal: Soft and nontender. No distention. Musculoskeletal: His upper and lower extremities without any difficulty. Normal gait was noted. Neurologic:  Normal speech and language. No gross focal neurologic deficits are appreciated. No gait instability. Skin:  Skin is warm, dry and intact. No  rash noted. Psychiatric: Mood and affect are normal. Speech and behavior are normal.  ____________________________________________   LABS (all labs ordered are listed, but only abnormal results are displayed)  Labs Reviewed - No data to display  PROCEDURES  Procedure(s) performed: None  Procedures  Critical Care performed: No  ____________________________________________   INITIAL IMPRESSION / ASSESSMENT AND PLAN / ED COURSE  Pertinent labs & imaging results that were available during my care of the patient were reviewed by me and considered in my medical decision making (see chart for details).  Patient is obtained over-the-counter decongestant such as Sudafed PE. She is also given a prescription for Tessalon Perles one or 2 every 8 hours as needed for cough. Patient is follow-up with her primary care doctor or [facility] if any continued problems. ____________________________________________   FINAL CLINICAL IMPRESSION(S) / ED DIAGNOSES  Final diagnoses:  Acute upper respiratory infection      NEW MEDICATIONS STARTED DURING THIS VISIT:  Discharge Medication List as of 02/08/2016  9:03 AM    START taking these medications   Details  benzonatate (TESSALON) 100 MG capsule Take one or 2 tablets every 8 hours as needed for cough., Print         Note:  This document was prepared using Dragon voice recognition software and may include unintentional dictation errors.    Tommi RumpsRhonda L Fabricio Endsley, PA-C 02/08/16 1324  Minna AntisKevin Paduchowski, MD 02/08/16 1535

## 2016-02-08 NOTE — ED Notes (Signed)
Pt presents with cough and fever , sore throat.

## 2016-05-22 ENCOUNTER — Emergency Department
Admission: EM | Admit: 2016-05-22 | Discharge: 2016-05-22 | Disposition: A | Discharge status: Home / Self Care | Payer: Medicaid Other | Attending: Emergency Medicine | Admitting: Emergency Medicine

## 2016-05-22 ENCOUNTER — Encounter: Payer: Self-pay | Admitting: Emergency Medicine

## 2016-05-22 DIAGNOSIS — Z23 Encounter for immunization: Secondary | ICD-10-CM | POA: Insufficient documentation

## 2016-05-22 DIAGNOSIS — Y999 Unspecified external cause status: Secondary | ICD-10-CM | POA: Insufficient documentation

## 2016-05-22 DIAGNOSIS — Z79899 Other long term (current) drug therapy: Secondary | ICD-10-CM | POA: Insufficient documentation

## 2016-05-22 DIAGNOSIS — T22211A Burn of second degree of right forearm, initial encounter: Secondary | ICD-10-CM

## 2016-05-22 DIAGNOSIS — Y9389 Activity, other specified: Secondary | ICD-10-CM | POA: Insufficient documentation

## 2016-05-22 DIAGNOSIS — X16XXXA Contact with hot heating appliances, radiators and pipes, initial encounter: Secondary | ICD-10-CM | POA: Insufficient documentation

## 2016-05-22 DIAGNOSIS — Y929 Unspecified place or not applicable: Secondary | ICD-10-CM | POA: Insufficient documentation

## 2016-05-22 DIAGNOSIS — J45909 Unspecified asthma, uncomplicated: Secondary | ICD-10-CM | POA: Insufficient documentation

## 2016-05-22 MED ORDER — TETANUS-DIPHTH-ACELL PERTUSSIS 5-2.5-18.5 LF-MCG/0.5 IM SUSP
INTRAMUSCULAR | Status: AC
  Administered 2016-05-22: 0.5 mL via INTRAMUSCULAR
  Filled 2016-05-22: qty 0.5

## 2016-05-22 MED ORDER — TETANUS-DIPHTH-ACELL PERTUSSIS 5-2.5-18.5 LF-MCG/0.5 IM SUSP
0.5000 mL | Freq: Once | INTRAMUSCULAR | Status: AC
  Administered 2016-05-22: 0.5 mL via INTRAMUSCULAR

## 2016-05-22 NOTE — Discharge Instructions (Signed)
You are doing a good job training or burn.  Continue using antibiotic ointment twice daily and keep the wound clean and covered with dry gauze or Band-Aid.  We gave you the name and number for [facility] if you need to follow-up, but it should heal well on its own.   Use over-the-counter ibuprofen and Tylenol as needed for pain control.  We also updated your tetanus vaccination today.  Return to the emergency department if you develop new or worsening symptoms that concern you.

## 2016-05-22 NOTE — ED Provider Notes (Signed)
[facility] Emergency Department Provider Note  ____________________________________________   First MD Initiated Contact with Patient 05/22/16 1625     (approximate)  I have reviewed the triage vital signs and the nursing notes.   HISTORY  Chief Complaint Burn    HPI Danielle SavinBrandy M Maloney is a 30 y.o. female who presents for evaluation of a burn on her right forearm.  She sustained the burn 5 days ago when she brushed up against the hot part of a lawn mower.  Skin has peeled away.  Pain is constant, waxing and waning, mild to moderate with surrounding itching.  Denies fever/chills, N/V/D, chest pain, SOB, abd pain.  No redness or swelling around or spreading away from the wound.  She has been treating it with Neosporin and "burn cream" that her grandmother gave her.  Has been keeping it covered with Band-Aids.   Past Medical History:  Diagnosis Date  . Asthma   . Migraines   . Sickle cell trait (HCC)     There are no active problems to display for this patient.   Past Surgical History:  Procedure Laterality Date  . CESAREAN SECTION      Prior to Admission medications   Medication Sig Start Date End Date Taking? Authorizing Provider  benzonatate (TESSALON) 100 MG capsule Take one or 2 tablets every 8 hours as needed for cough. 02/08/16   Tommi Rumpshonda L Summers, PA-C  hydrocortisone cream 0.5 % Apply 1 application topically 2 (two) times daily. 12/12/15   Evangeline Dakinharles M Beers, PA-C  hydrOXYzine (VISTARIL) 25 MG capsule Take 1 capsule (25 mg total) by mouth 3 (three) times daily as needed. 12/12/15   Evangeline Dakinharles M Beers, PA-C  predniSONE (DELTASONE) 10 MG tablet Take 5 tablets (50 mg total) by mouth daily with breakfast. 12/12/15   Evangeline Dakinharles M Beers, PA-C    Allergies Review of patient's allergies indicates no known allergies.  No family history on file.  Social History Social History  Substance Use Topics  . Smoking status: Never Smoker  . Smokeless tobacco:  Never Used  . Alcohol use No    Review of Systems Constitutional: No fever/chills Cardiovascular: Denies chest pain. Respiratory: Denies shortness of breath. Gastrointestinal: No abdominal pain.  No nausea, no vomiting.  No diarrhea.  No constipation. Skin: Circular burn on palmar side of right forearm Neurological: Negative for headaches, focal weakness or numbness.  ____________________________________________   PHYSICAL EXAM:  VITAL SIGNS: ED Triage Vitals  Enc Vitals Group     BP 05/22/16 1453 125/68     Pulse Rate 05/22/16 1453 85     Resp 05/22/16 1453 16     Temp 05/22/16 1453 98.6 F (37 C)     Temp Source 05/22/16 1453 Oral     SpO2 05/22/16 1453 99 %     Weight 05/22/16 1454 175 lb (79.4 kg)     Height 05/22/16 1454 5\' 5"  (1.651 m)     Head Circumference --      Peak Flow --      Pain Score 05/22/16 1454 8     Pain Loc --      Pain Edu? --      Excl. in GC? --     Constitutional: Alert and oriented. Well appearing and in no acute distress. Neck: No stridor.  No meningeal signs.   Cardiovascular: Normal rate, regular rhythm. Good peripheral circulation. Grossly normal heart sounds. Respiratory: Normal respiratory effort.  No retractions. Lungs CTAB. Musculoskeletal: No lower  extremity tenderness nor edema. No gross deformities of extremities. Neurologic:  Normal speech and language. No gross focal neurologic deficits are appreciated.  Skin:  Roughly circular 2nd degree partial thickness burn about 3 cm x 2 cm on the palmar side of mid right forearm.  Appears to be starting to heal, no surrounding Erythema or induration, no evidence of infection. Psychiatric: Mood and affect are normal. Speech and behavior are normal.  ____________________________________________   LABS (all labs ordered are listed, but only abnormal results are displayed)  Labs Reviewed - No data to  display ____________________________________________  EKG   ____________________________________________  RADIOLOGY   No results found.  ____________________________________________   PROCEDURES  Procedure(s) performed:   Procedures   Critical Care performed: No ____________________________________________   INITIAL IMPRESSION / ASSESSMENT AND PLAN / ED COURSE  Pertinent labs & imaging results that were available during my care of the patient were reviewed by me and considered in my medical decision making (see chart for details).  Patient is appropriately treating her wound with antibiotic ointment.  I encouraged her to continue using the antibiotic ointment twice daily and keep it covered with dry gauze.  I will give her the name and number of the [facility] burn clinic but I do not believe that she needs to follow up with them at this point given that it it seems to be healing, there is no sign of infection, and does not cross any joints.  I gave my usual and customary return precautions.  Tetanus is out of date, so giving Tdap.     ____________________________________________  FINAL CLINICAL IMPRESSION(S) / ED DIAGNOSES  Final diagnoses:  Burn of right forearm, second degree, initial encounter     MEDICATIONS GIVEN DURING THIS VISIT:  Medications  Tdap (BOOSTRIX) injection 0.5 mL (not administered)  Tdap (BOOSTRIX) 5-2.5-18.5 LF-MCG/0.5 injection (not administered)     NEW OUTPATIENT MEDICATIONS STARTED DURING THIS VISIT:  New Prescriptions   No medications on file    Modified Medications   No medications on file    Discontinued Medications   No medications on file     Note:  This document was prepared using Dragon voice recognition software and may include unintentional dictation errors.    Loleta Rose, MD 05/22/16 [phone]

## 2016-05-22 NOTE — ED Notes (Signed)
Pt. States burned arm on lawnmower Saturday. Been using neosporin and burn cream topically but has been draining fluid and concerned of infection. Area not draining at this time, not hot to touch. Pt. C/o pain 8/10 to area.

## 2016-05-22 NOTE — ED Triage Notes (Signed)
Pt to ED c/o burn to right forearm on Saturday after touching hot part of her lawn mower.  Pt with swelling around area, no drainage.  States using burn cream and antiseptic cream at home.  Surrounding skin WNL.

## 2016-05-22 NOTE — ED Notes (Signed)
AAOx3.  Skin warm and dry.  NAD 

## 2016-05-28 ENCOUNTER — Emergency Department
Admission: EM | Admit: 2016-05-28 | Discharge: 2016-05-28 | Disposition: A | Discharge status: Home / Self Care | Payer: No Typology Code available for payment source | Attending: Emergency Medicine | Admitting: Emergency Medicine

## 2016-05-28 ENCOUNTER — Emergency Department: Payer: No Typology Code available for payment source

## 2016-05-28 DIAGNOSIS — Y999 Unspecified external cause status: Secondary | ICD-10-CM | POA: Diagnosis not present

## 2016-05-28 DIAGNOSIS — J45909 Unspecified asthma, uncomplicated: Secondary | ICD-10-CM | POA: Insufficient documentation

## 2016-05-28 DIAGNOSIS — M545 Low back pain, unspecified: Secondary | ICD-10-CM

## 2016-05-28 DIAGNOSIS — Y9389 Activity, other specified: Secondary | ICD-10-CM | POA: Diagnosis not present

## 2016-05-28 DIAGNOSIS — S3992XA Unspecified injury of lower back, initial encounter: Secondary | ICD-10-CM | POA: Diagnosis present

## 2016-05-28 DIAGNOSIS — Z79899 Other long term (current) drug therapy: Secondary | ICD-10-CM | POA: Diagnosis not present

## 2016-05-28 DIAGNOSIS — Y92410 Unspecified street and highway as the place of occurrence of the external cause: Secondary | ICD-10-CM | POA: Insufficient documentation

## 2016-05-28 LAB — POCT PREGNANCY, URINE: PREG TEST UR: NEGATIVE

## 2016-05-28 MED ORDER — IBUPROFEN 600 MG PO TABS
600.0000 mg | ORAL_TABLET | Freq: Three times a day (TID) | ORAL | 0 refills | Status: DC | PRN
Start: 2016-05-28 — End: 2017-12-22
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
## 2016-05-28 NOTE — Discharge Instructions (Signed)
Follow[facility] if any continued problems. Begin taking ibuprofen 600 mg 3 times a day with food. Moist heat or ice to her back as needed for comfort.

## 2017-10-06 ENCOUNTER — Ambulatory Visit: Payer: Medicaid Other | Admitting: Physical Therapy

## 2017-10-15 ENCOUNTER — Encounter: Payer: Self-pay | Admitting: Physical Therapy

## 2017-10-15 ENCOUNTER — Ambulatory Visit: Payer: Medicaid Other | Admitting: Physical Therapy

## 2017-10-20 ENCOUNTER — Encounter: Payer: Self-pay | Admitting: Physical Therapy

## 2017-10-27 ENCOUNTER — Ambulatory Visit: Payer: Medicaid Other | Attending: Nurse Practitioner | Admitting: Physical Therapy

## 2017-10-27 ENCOUNTER — Encounter: Payer: Self-pay | Admitting: Physical Therapy

## 2017-11-04 ENCOUNTER — Ambulatory Visit: Payer: Medicaid Other | Admitting: Physical Therapy

## 2017-11-10 ENCOUNTER — Encounter: Payer: Self-pay | Admitting: Physical Therapy

## 2017-11-17 ENCOUNTER — Encounter: Payer: Self-pay | Admitting: Physical Therapy

## 2017-11-24 ENCOUNTER — Encounter: Payer: Self-pay | Admitting: Physical Therapy

## 2017-11-30 ENCOUNTER — Encounter: Payer: Self-pay | Admitting: Obstetrics and Gynecology

## 2017-11-30 ENCOUNTER — Ambulatory Visit (INDEPENDENT_AMBULATORY_CARE_PROVIDER_SITE_OTHER): Payer: Medicaid Other | Admitting: Obstetrics and Gynecology

## 2017-11-30 VITALS — BP 120/78 | HR 84 | Ht 65.0 in | Wt 202.0 lb

## 2017-11-30 DIAGNOSIS — R102 Pelvic and perineal pain: Secondary | ICD-10-CM

## 2017-11-30 NOTE — Progress Notes (Signed)
Center, [facility]   Chief Complaint  Patient presents with  . Pelvic Pain    sense start of period, getting worse x3 months, sharp pain, pelvic area and stomach    HPI:      [name] is a 32 y.o. No obstetric history on file. who LMP was No LMP recorded. Patient has had an injection., presents today for NP eval of chronic pelvic pain. Pt with hx of pain with menses since menarche, about age 54. Was put on depo for dysmen as a teenager with sx relief. S/p dx lap 2012 with Dr. Luella Cook with LOA, no endometriosis. Placed on depo again with sx relief for a few yrs. Stopped it in 2014. Hx of migraines so was put on depo again 6/18 with sx improvement of headaches. Pt has had sharp, stabbing or punching pain, intermittently every day for the past 3 months. Sx last about 3 hrs. Takes tylenol wihtout relief. Pain affects sleep. Last depo 11/19/17. Pt saw PCP 3/19 and had STD testing but was still treated with abx for PID without any sx change. No recent u/s or imaging. No vag bleeidng with depo. No GI sx except diarrhea last wk that has resolved. No urin sx, no vag sx. She has a long hx of dyspareunia, only 1 time with postcoital bleeding.    Past Medical History:  Diagnosis Date  . Asthma   . Migraines   . Sickle cell trait [facility])     Past Surgical History:  Procedure Laterality Date  . CESAREAN SECTION    . DIAGNOSTIC LAPAROSCOPY  2012   Dr. Luella Cook; lysis of adhesions    History reviewed. No pertinent family history.  Social History   Socioeconomic History  . Marital status: Single    Spouse name: Not on file  . Number of children: Not on file  . Years of education: Not on file  . Highest education level: Not on file  Occupational History  . Not on file  Social Needs  . Financial resource strain: Not on file  . Food insecurity:    Worry: Not on file    Inability: Not on file  . Transportation needs:    Medical: Not on file    Non-medical: Not on  file  Tobacco Use  . Smoking status: Never Smoker  . Smokeless tobacco: Never Used  Substance and Sexual Activity  . Alcohol use: No  . Drug use: No  . Sexual activity: Yes    Birth control/protection: Injection  Lifestyle  . Physical activity:    Days per week: Not on file    Minutes per session: Not on file  . Stress: Not on file  Relationships  . Social connections:    Talks on phone: Not on file    Gets together: Not on file    Attends religious service: Not on file    Active member of club or organization: Not on file    Attends meetings of clubs or organizations: Not on file    Relationship status: Not on file  . Intimate partner violence:    Fear of current or ex partner: Not on file    Emotionally abused: Not on file    Physically abused: Not on file    Forced sexual activity: Not on file  Other Topics Concern  . Not on file  Social History Narrative  . Not on file    Outpatient Medications Prior to Visit  Medication Sig  Dispense Refill  . ibuprofen (ADVIL,MOTRIN) 600 MG tablet Take 1 tablet (600 mg total) by mouth every 8 (eight) hours as needed. 30 tablet 0  . medroxyPROGESTERone (DEPO-PROVERA) 150 MG/ML injection Inject 150 mg into the muscle every 3 (three) months.    . benzonatate (TESSALON) 100 MG capsule Take one or 2 tablets every 8 hours as needed for cough. (Patient not taking: Reported on 11/30/2017) 30 capsule 0  . hydrocortisone cream 0.5 % Apply 1 application topically 2 (two) times daily. (Patient not taking: Reported on 11/30/2017) 30 g 0  . hydrOXYzine (VISTARIL) 25 MG capsule Take 1 capsule (25 mg total) by mouth 3 (three) times daily as needed. (Patient not taking: Reported on 11/30/2017) 30 capsule 0  . predniSONE (DELTASONE) 10 MG tablet Take 5 tablets (50 mg total) by mouth daily with breakfast. (Patient not taking: Reported on 11/30/2017) 25 tablet 0   No facility-administered medications prior to visit.      ROS:  Review of Systems    Constitutional: Positive for fatigue. Negative for fever and unexpected weight change.  Respiratory: Negative for cough, shortness of breath and wheezing.   Cardiovascular: Negative for chest pain, palpitations and leg swelling.  Gastrointestinal: Positive for diarrhea. Negative for blood in stool, constipation, nausea and vomiting.  Endocrine: Negative for cold intolerance, heat intolerance and polyuria.  Genitourinary: Positive for dyspareunia and pelvic pain. Negative for dysuria, flank pain, frequency, genital sores, hematuria, menstrual problem, urgency, vaginal bleeding, vaginal discharge and vaginal pain.  Musculoskeletal: Negative for back pain, joint swelling and myalgias.  Skin: Negative for rash.  Neurological: Positive for headaches. Negative for dizziness, syncope, light-headedness and numbness.  Hematological: Negative for adenopathy.  Psychiatric/Behavioral: Negative for agitation, confusion, sleep disturbance and suicidal ideas. The patient is not nervous/anxious.    BREAST: No symptoms   OBJECTIVE:   Vitals:  BP 120/78   Pulse 84   Ht  (1.651 m)   Wt 202 lb (91.6 kg)   BMI 33.61 kg/m   Physical Exam  Constitutional: She is oriented to person, place, and time. Vital signs are normal. She appears well-developed.  Pulmonary/Chest: Effort normal.  Abdominal: Soft. There is no hepatosplenomegaly. There is no tenderness. There is no rigidity and no guarding. No hernia.  Genitourinary: Vagina normal. There is no rash, tenderness or lesion on the right labia. There is no rash, tenderness or lesion on the left labia. Uterus is tender. Uterus is not enlarged. Cervix exhibits motion tenderness. Right adnexum displays no mass and no tenderness. Left adnexum displays no mass and no tenderness. No erythema or tenderness in the vagina. No vaginal discharge found.  Musculoskeletal: Normal range of motion.  Neurological: She is alert and oriented to person, place, and time.   Psychiatric: She has a normal mood and affect. Her behavior is normal. Thought content normal.  Vitals reviewed.   Assessment/Plan: Pelvic pain - For 3 months. Hx of CPP since menarche, usually improved with depo. Treated for PID without relief. Check GYN u/s. Will call with results. If abn, refer to MD - Plan: US PELVIS TRANSVANGINAL NON-OB (TV ONLY)    Return in about 1 day (around 12/01/2017) for GYN u/s for pelvic pain--[facility] to call pt.  Rashel Okeefe B. Zoe Creasman, PA-C 11/30/2017 2:59 PM

## 2017-11-30 NOTE — Patient Instructions (Signed)
I value your feedback and entrusting us with your care. If you get a [place] patient survey, I would appreciate you taking the time to let us know about your experience today. Thank you! 

## 2017-12-01 ENCOUNTER — Ambulatory Visit (INDEPENDENT_AMBULATORY_CARE_PROVIDER_SITE_OTHER): Payer: Medicaid Other

## 2017-12-01 DIAGNOSIS — R102 Pelvic and perineal pain: Secondary | ICD-10-CM

## 2017-12-02 ENCOUNTER — Telehealth: Payer: Self-pay | Admitting: Obstetrics and Gynecology

## 2017-12-02 ENCOUNTER — Encounter: Payer: Self-pay | Admitting: Physical Therapy

## 2017-12-02 NOTE — Telephone Encounter (Signed)
Results:   ULTRASOUND REPORT  Patient Name: Danielle Pollard DOB: 05-23-1986 MRN: [id number]  Location: [facility]  Date of Service: 12/01/2017    Indications:Pelvic Pain Findings:  The uterus is anteverted and measures 8.99 x 6.47 x 5.58cm. Echo texture is homogenous without evidence of focal masses.  The Endometrium measures 6.37 mm.  Right Ovary measures 3.67 x 2.91 x 2.46 cm. It is not normal in appearance. Left Ovary measures 3.1 x 2.73 x 2.36 x  cm. It is not normal in appearance. Bilateral ovaries have multiple, tiny, peripheral follicles suspicious for PCOS.  Survey of the adnexa demonstrates no adnexal masses. There is no free fluid in the cul de sac.  Impression: 1. Suspicious for PCOS  Recommendations: 1.Clinical correlation with the patient's History and Physical Exam.  Willette Alma, RDMS, RVT

## 2017-12-04 NOTE — Telephone Encounter (Signed)
Patient is returning call from [place] about her results. Please advise

## 2017-12-07 NOTE — Telephone Encounter (Signed)
Discussed neg u/s results with pt. S/P LOA in 2012. Question if new adhesions developed. F/u with [facility] for surg consult/further eval.

## 2017-12-07 NOTE — Telephone Encounter (Signed)
Patient is calling for results. Please advise  °

## 2017-12-11 ENCOUNTER — Ambulatory Visit (INDEPENDENT_AMBULATORY_CARE_PROVIDER_SITE_OTHER): Payer: Medicaid Other | Admitting: Obstetrics & Gynecology

## 2017-12-11 ENCOUNTER — Telehealth: Payer: Self-pay

## 2017-12-11 ENCOUNTER — Telehealth: Payer: Self-pay | Admitting: Obstetrics & Gynecology

## 2017-12-11 ENCOUNTER — Encounter: Payer: Self-pay | Admitting: Obstetrics & Gynecology

## 2017-12-11 VITALS — BP 120/80 | Ht 65.0 in | Wt 200.0 lb

## 2017-12-11 DIAGNOSIS — G8929 Other chronic pain: Secondary | ICD-10-CM | POA: Insufficient documentation

## 2017-12-11 DIAGNOSIS — R102 Pelvic and perineal pain: Secondary | ICD-10-CM | POA: Diagnosis not present

## 2017-12-11 MED ORDER — MELOXICAM 15 MG PO TABS: 15.0000 mg | ORAL_TABLET | Freq: Two times a day (BID) | ORAL | 3 refills | Status: DC | PRN

## 2017-12-11 NOTE — Telephone Encounter (Signed)
-----   Message from Nadara Mustard, MD sent at 12/11/2017  8:26 AM EDT ----- Regarding: surgery Surgery Booking Request Patient Full Name:  Danielle Pollard  MRN: [id number]  DOB: [birth year]  Surgeon: Letitia Libra, MD  Requested Surgery Date and Time: June 11 Primary Diagnosis AND Code: Chronic pelvic pain Secondary Diagnosis and Code:  Surgical Procedure: Laparoscopy, possible TLH BS L&D Notification: No Admission Status: same day surgery Length of Surgery: 1.5 hr Special Case Needs: no H&P: yes (date) Phone Interview???: yes Interpreter: Language:  Medical Clearance: no Special Scheduling Instructions: no

## 2017-12-11 NOTE — Progress Notes (Signed)
Gynecology Pelvic Pain Evaluation   Chief Complaint  Patient presents with  . Pelvic Pain   History of Present Illness:   Patient is a 32 y.o. G1P1001 who LMP was No LMP recorded. Patient has had an injection., presents today for a problem visit.  She complains of pain.   Her pain is localized to the periumbilical and deep pelvis area, described as constant, began several months ago and its severity is described as severe. The pain radiates to the  bilateral lower legs. She has these associated symptoms which include headache. Patient has these modifiers which include NSAIDS but min relief that make it better and unable to associate with any factor that make it worse.  Context includes: spontaneous.  Depo suppresses periods band helps with migraine headaches but not w pelvic pain, has been on (again) for the last 12 mos.  Previous evaluation: CT, [place], empiric ABX use for PID. No dx or help.  Prior Diagnosis: prior pelvic surgery and polycystic ovary syndrome. Previous Treatment: NSAID and Antibiotics  completed.  PMHx: She  has a past medical history of Asthma, Migraines, and Sickle cell trait (HCC). Also,  has a past surgical history that includes Cesarean section and Diagnostic laparoscopy (2012)., family history includes Hypertension in her maternal grandfather and maternal grandmother.,  reports that she has never smoked. She has never used smokeless tobacco. She reports that she does not drink alcohol or use drugs.  She has a current medication list which includes the following prescription(s): medroxyprogesterone, benzonatate, hydrocortisone cream, hydroxyzine, ibuprofen, and prednisone. Also, has No Known Allergies.  Review of Systems  Constitutional: Negative for chills, fever and malaise/fatigue.  HENT: Negative for congestion, sinus pain and sore throat.   Eyes: Negative for blurred vision and pain.  Respiratory: Negative for cough and wheezing.   Cardiovascular: Negative for  chest pain and leg swelling.  Gastrointestinal: Negative for abdominal pain, constipation, diarrhea, heartburn, nausea and vomiting.  Genitourinary: Negative for dysuria, frequency, hematuria and urgency.  Musculoskeletal: Negative for back pain, joint pain, myalgias and neck pain.  Skin: Negative for itching and rash.  Neurological: Negative for dizziness, tremors and weakness.  Endo/Heme/Allergies: Does not bruise/bleed easily.  Psychiatric/Behavioral: Negative for depression. The patient is not nervous/anxious and does not have insomnia.    Objective: BP 120/80   Ht  (1.651 m)   Wt 200 lb (90.7 kg)   BMI 33.28 kg/m  Physical Exam  Constitutional: She is oriented to person, place, and time. She appears well-developed and well-nourished. No distress.  Musculoskeletal: Normal range of motion.  Neurological: She is alert and oriented to person, place, and time.  Skin: Skin is warm and dry.  Psychiatric: She has a normal mood and affect.  Vitals reviewed.  Assessment: 31 y.o. G1P1001 with CPP. 1. Chronic pelvic pain in female NSAIDs and Meloxicam discussed for pain    Desire to avoid narcotics Surgery to assess pain etiology, as scans and response to meds not adequate or defining    Dx Lap with necessary procedures if problem found, vs hysterectomy to remove uterus and tubes and potential centralized nidus for pain    Pt understands hysterectomy would remove potential for pregnancy, and is OK with that; has 31 year old and wants no more pregnancies    Pros and cons of surgery and hysterectomy counseled, written info gv to pt    To sch surgery soon  A total of 15 minutes were spent face-to-face with the patient during this encounter  and over half of that time dealt with counseling and coordination of care.  Annamarie Major, MD, [facility], [facility] 12/11/2017  8:29 AM

## 2017-12-11 NOTE — Telephone Encounter (Signed)
Patient is aware of H&P at [facility] on 12/29/17 @ 8:20am w/ Dr. Tiburcio Pea, Pre-admit Testing phone interview to be scheduled, and OR on 01/05/18. Patient is aware she may receive calls from the [facility] and [facility]  MyChart code sent by text and patient confirmed received. Ext given.

## 2017-12-11 NOTE — Telephone Encounter (Signed)
RX fixed with pharmacy

## 2017-12-11 NOTE — Telephone Encounter (Signed)
Pharm states that Meloxicam RX is usually  once a day or 7.5 mg BID with total of 15 mg a day. They just wanted to make sure that he meant  BID (total of  a day) please advise and I will call pharm back to clarify.

## 2017-12-11 NOTE — Telephone Encounter (Signed)
15 mg once daily

## 2017-12-11 NOTE — Telephone Encounter (Signed)
Can you call them and ask what do they recommend ? And just send this to him. He will change if need be

## 2017-12-11 NOTE — Patient Instructions (Signed)
Total Laparoscopic Hysterectomy °A total laparoscopic hysterectomy is a minimally invasive surgery to remove your uterus and cervix. This surgery is performed by making several small cuts (incisions) in your abdomen. It can also be done with a thin, lighted tube (laparoscope) inserted into two small incisions in your lower abdomen. Your fallopian tubes and ovaries can be removed (bilateral salpingo-oophorectomy) during this surgery as well. Benefits of minimally invasive surgery include: °· Less pain. °· Less risk of blood loss. °· Less risk of infection. °· Quicker return to normal activities. ° °Tell a health care provider about: °· Any allergies you have. °· All medicines you are taking, including vitamins, herbs, eye drops, creams, and over-the-counter medicines. °· Any problems you or family members have had with anesthetic medicines. °· Any blood disorders you have. °· Any surgeries you have had. °· Any medical conditions you have. °What are the risks? °Generally, this is a safe procedure. However, as with any procedure, complications can occur. Possible complications include: °· Bleeding. °· Blood clots in the legs or lung. °· Infection. °· Injury to surrounding organs. °· Problems with anesthesia. °· Early menopause symptoms (hot flashes, night sweats, insomnia). °· Risk of conversion to an open abdominal incision. ° °What happens before the procedure? °· Ask your health care provider about changing or stopping your regular medicines. °· Do not take aspirin or blood thinners (anticoagulants) for 1 week before the surgery or as told by your health care provider. °· Do not eat or drink anything for 8 hours before the surgery or as told by your health care provider. °· Quit smoking if you smoke. °· Arrange for a ride home after surgery and for someone to help you at home during recovery. °What happens during the procedure? °· You will be given antibiotic medicine. °· An IV tube will be placed in your arm. You  will be given medicine to make you sleep (general anesthetic). °· A gas (carbon dioxide) will be used to inflate your abdomen. This will allow your surgeon to look inside your abdomen, perform your surgery, and treat any other problems found if necessary. °· Three or four small incisions (often less than 1/2 inch) will be made in your abdomen. One of these incisions will be made in the area of your belly button (navel). The laparoscope will be inserted into the incision. Your surgeon will look through the laparoscope while doing your procedure. °· Other surgical instruments will be inserted through the other incisions. °· Your uterus may be removed through your vagina or cut into small pieces and removed through the small incisions. °· Your incisions will be closed. °What happens after the procedure? °· The gas will be released from inside your abdomen. °· You will be taken to the recovery area where a nurse will watch and check your progress. Once you are awake, stable, and taking fluids well, without other problems, you will return to your room or be allowed to go home. °· There is usually minimal discomfort following the surgery because the incisions are so small. °· You will be given pain medicine while you are in the hospital and for when you go home. °This information is not intended to replace advice given to you by your health care provider. Make sure you discuss any questions you have with your health care provider. °Document Released: 05/11/2007 Document Revised: 12/20/2015 Document Reviewed: 02/01/2013 °Elsevier Interactive Patient Education © 2017 [employer] ° °

## 2017-12-11 NOTE — Telephone Encounter (Signed)
Pt calling triage, states that pharmacy told her that the dosage that [facility] sent in for the medication today was not the correct dose? Please have him advise.

## 2017-12-14 ENCOUNTER — Other Ambulatory Visit: Payer: Medicaid Other

## 2017-12-15 ENCOUNTER — Telehealth: Payer: Self-pay

## 2017-12-15 NOTE — Telephone Encounter (Signed)
Pt calling triage states that the medication RPH RXed on Friday is not helping and not sure if there is something else he can send in?

## 2017-12-16 MED ORDER — TRAMADOL HCL 50 MG PO TABS: 50.0000 mg | ORAL_TABLET | Freq: Four times a day (QID) | ORAL | 0 refills | Status: DC | PRN

## 2017-12-16 NOTE — Telephone Encounter (Signed)
Pt states this will be hard as the pain is bad, I told her that maybe the Tramadol will work better, just try and let us know

## 2017-12-16 NOTE — Telephone Encounter (Signed)
Looks like you sent in Mobic. Please advise

## 2017-12-16 NOTE — Telephone Encounter (Signed)
Have changed to Tramadol for short term use, use only as needed between now and surgery date (only allowed one Rx for this medicine pre op).  Thx.

## 2017-12-29 ENCOUNTER — Other Ambulatory Visit: Payer: Medicaid Other

## 2017-12-29 ENCOUNTER — Ambulatory Visit (INDEPENDENT_AMBULATORY_CARE_PROVIDER_SITE_OTHER): Payer: Medicaid Other | Admitting: Obstetrics & Gynecology

## 2017-12-29 ENCOUNTER — Encounter: Payer: Self-pay | Admitting: Obstetrics & Gynecology

## 2017-12-29 ENCOUNTER — Encounter
Admission: RE | Admit: 2017-12-29 | Discharge: 2017-12-29 | Disposition: A | Discharge status: Home / Self Care | Payer: Medicaid Other | Source: Ambulatory Visit | Attending: Obstetrics & Gynecology | Admitting: Obstetrics & Gynecology

## 2017-12-29 ENCOUNTER — Other Ambulatory Visit: Payer: Self-pay

## 2017-12-29 VITALS — BP 120/80 | Ht 65.0 in | Wt 200.0 lb

## 2017-12-29 DIAGNOSIS — R102 Pelvic and perineal pain: Secondary | ICD-10-CM | POA: Diagnosis not present

## 2017-12-29 DIAGNOSIS — G8929 Other chronic pain: Secondary | ICD-10-CM

## 2017-12-29 HISTORY — DX: Anemia, unspecified: D64.9

## 2017-12-29 NOTE — Patient Instructions (Signed)
Your procedure is scheduled on: 01-05-18 TUESDAY Report to Same Day Surgery 2nd floor medical mall [facility] Entrance-take elevator on left to 2nd floor.  Check in with surgery information desk.) To find out your arrival time please call [phone] between 1PM - 3PM on 01-04-18 MONDAY  Remember: Instructions that are not followed completely may result in serious medical risk, up to and including death, or upon the discretion of your surgeon and anesthesiologist your surgery may need to be rescheduled.    _x___ 1. Do not eat food after midnight the night before your procedure. NO GUM OR CANDY AFTER MIDNIGHT.  You may drink clear liquids up to 2 hours before you are scheduled to arrive at the hospital for your procedure.  Do not drink clear liquids within 2 hours of your scheduled arrival to the hospital.  Clear liquids include  --Water or Apple juice without pulp  --Clear carbohydrate beverage such as ClearFast or Gatorade  --Black Coffee or Clear Tea (No milk, no creamers, do not add anything to the coffee or Tea    __x__ 2. No Alcohol for 24 hours before or after surgery.   __x__3. No Smoking or e-cigarettes for 24 prior to surgery.  Do not use any chewable tobacco products for at least 6 hour prior to surgery   ____  4. Bring all medications with you on the day of surgery if instructed.    __x__ 5. Notify your doctor if there is any change in your medical condition     (cold, fever, infections).    x___6. On the morning of surgery brush your teeth with toothpaste and water.  You may rinse your mouth with mouth wash if you wish.  Do not swallow any toothpaste or mouthwash.   Do not wear jewelry, make-up, hairpins, clips or nail polish.  Do not wear lotions, powders, or perfumes. You may wear deodorant.  Do not shave 48 hours prior to surgery. Men may shave face and neck.  Do not bring valuables to the hospital.    [facility] is not responsible for any belongings or  valuables.               Contacts, dentures or bridgework may not be worn into surgery.  Leave your suitcase in the car. After surgery it may be brought to your room.  For patients admitted to the hospital, discharge time is determined by your treatment team.  _  Patients discharged the day of surgery will not be allowed to drive home.  You will need someone to drive you home and stay with you the night of your procedure.    Please read over the following fact sheets that you were given:   [facility] Preparing for Surgery and or MRSA Information   ____ Take anti-hypertensive listed below, cardiac, seizure, asthma,anti-reflux and psychiatric medicines. These include:  1. NONE  2.  3.  4.  5.  6.  ____Fleets enema or Magnesium Citrate as directed.   _x___ Use CHG Soap or sage wipes as directed on instruction sheet   _X___ Use inhalers on the day of surgery and bring to hospital day of surgery-USE YOUR ALBUTEROL INHALER AM OF SURGERY AND BRING INHALER TO HOSPITAL  ____ Stop Metformin and Janumet 2 days prior to surgery.    ____ Take 1/2 of usual insulin dose the night before surgery and none on the morning surgery.   ____ Follow recommendations from Cardiologist, Pulmonologist or PCP regarding stopping Aspirin, Coumadin,  Plavix ,Eliquis, Effient, or Pradaxa, and Pletal.  X____Stop Anti-inflammatories such as Advil, Aleve, Ibuprofen, Motrin, Naproxen, MELOXICAM, Naprosyn, Goodies powders or aspirin products NOW-OK to take Tylenol OR TRAMADOL IF NEEDED   ____ Stop supplements until after surgery.   ____ Bring C-Pap to the hospital.

## 2017-12-29 NOTE — Patient Instructions (Signed)

## 2017-12-29 NOTE — Progress Notes (Signed)
PRE-OPERATIVE HISTORY AND PHYSICAL EXAM  HPI:  Danielle Pollard is a 32 y.o. G1P1001 No LMP recorded. Patient has had an injection.; she is being admitted for surgery related to pelvic pain. Her pain is localized to the periumbilical and deep pelvis area, described as constant, began several months ago (although also had pelvic pain starting in 2012; including diagnostic laparoscopy and LOA then) and its severity is described as severe. The pain radiates to the  bilateral lower legs. She has these associated symptoms which include headache. Patient has these modifiers which include NSAIDS but min relief that make it better and unable to associate with any factor that make it worse.  Context includes: spontaneous.  Depo suppresses periods and helps with migraine headaches but not w pelvic pain, has been on (again) for the last 12 mos.  No desire for fertility.  Previous evaluation: CT, [place], empiric ABX use for PID. No dx or help.  Prior Diagnosis: prior pelvic surgery and polycystic ovary syndrome.  PMHx: Past Medical History:  Diagnosis Date  . Asthma   . Migraines   . Sickle cell trait [facility])    Past Surgical History:  Procedure Laterality Date  . CESAREAN SECTION    . DIAGNOSTIC LAPAROSCOPY  2012   Dr. Luella Cook; lysis of adhesions   Family History  Problem Relation Age of Onset  . Hypertension Maternal Grandmother   . Hypertension Maternal Grandfather    Social History   Tobacco Use  . Smoking status: Never Smoker  . Smokeless tobacco: Never Used  Substance Use Topics  . Alcohol use: No  . Drug use: No    Current Outpatient Medications:  .  acetaminophen (TYLENOL) 500 MG tablet, Take 1,000 mg by mouth every 6 (six) hours as needed (for pain.)., Disp: , Rfl:  .  medroxyPROGESTERone (DEPO-PROVERA) 150 MG/ML injection, Inject 150 mg into the muscle every 3 (three) months., Disp: , Rfl:  .  meloxicam (MOBIC) 15 MG tablet, Take 1 tablet (15 mg total) by mouth 2 (two) times  daily as needed for pain., Disp: 60 tablet, Rfl: 3 .  traMADol (ULTRAM) 50 MG tablet, Take 1 tablet (50 mg total) by mouth every 6 (six) hours as needed. (Patient taking differently: Take 50 mg by mouth every 6 (six) hours as needed (for pain.). ), Disp: 20 tablet, Rfl: 0 Allergies: Patient has no known allergies.  Review of Systems  Constitutional: Negative for chills, fever and malaise/fatigue.  HENT: Negative for congestion, sinus pain and sore throat.   Eyes: Negative for blurred vision and pain.  Respiratory: Negative for cough and wheezing.   Cardiovascular: Negative for chest pain and leg swelling.  Gastrointestinal: Negative for abdominal pain, constipation, diarrhea, heartburn, nausea and vomiting.  Genitourinary: Negative for dysuria, frequency, hematuria and urgency.  Musculoskeletal: Negative for back pain, joint pain, myalgias and neck pain.  Skin: Negative for itching and rash.  Neurological: Negative for dizziness, tremors and weakness.  Endo/Heme/Allergies: Does not bruise/bleed easily.  Psychiatric/Behavioral: Negative for depression. The patient is not nervous/anxious and does not have insomnia.     Objective: BP 120/80   Ht 5\' 5"  (1.651 m)   Wt 200 lb (90.7 kg)   BMI 33.28 kg/m   Filed Weights   12/29/17 0818  Weight: 200 lb (90.7 kg)   Physical Exam  Constitutional: She is oriented to person, place, and time. She appears well-developed and well-nourished. No distress.  HENT:  Head: Normocephalic and atraumatic. Head is without laceration.  Right Ear: Hearing normal.  Left Ear: Hearing normal.  Nose: No epistaxis.  No foreign bodies.  Mouth/Throat: Uvula is midline, oropharynx is clear and moist and mucous membranes are normal.  Eyes: Pupils are equal, round, and reactive to light.  Neck: Normal range of motion. Neck supple. No thyromegaly present.  Cardiovascular: Normal rate and regular rhythm. Exam reveals no gallop and no friction rub.  No murmur  heard. Pulmonary/Chest: Effort normal and breath sounds normal. No respiratory distress. She has no wheezes. Right breast exhibits no mass, no skin change and no tenderness. Left breast exhibits no mass, no skin change and no tenderness.  Abdominal: Soft. Bowel sounds are normal. She exhibits no distension. There is no tenderness. There is no rebound.  Musculoskeletal: Normal range of motion.  Neurological: She is alert and oriented to person, place, and time. No cranial nerve deficit.  Skin: Skin is warm and dry.  Psychiatric: She has a normal mood and affect. Judgment normal.  Vitals reviewed.  Assessment: 1. Chronic pelvic pain in female   Options for diagnostic laparoscopy and cystoscopy, vs hysterectomy, discussed, and pt prefers hysterectomy as well as any necessary procedure to treat and prevent future pelvic pain.  Pt does not want any future pregnancies and has been dealing with pain for years off and on and more so recently.  I have had a careful discussion with this patient about all the options available and the risk/benefits of each. I have fully informed this patient that surgery may subject her to a variety of discomforts and risks: She understands that most patients have surgery with little difficulty, but problems can happen ranging from minor to fatal. These include nausea, vomiting, pain, bleeding, infection, poor healing, hernia, or formation of adhesions. Unexpected reactions may occur from any drug or anesthetic given. Unintended injury may occur to other pelvic or abdominal structures such as Fallopian tubes, ovaries, bladder, ureter (tube from kidney to bladder), or bowel. Nerves going from the pelvis to the legs may be injured. Any such injury may require immediate or later additional surgery to correct the problem. Excessive blood loss requiring transfusion is very unlikely but possible. Dangerous blood clots may form in the legs or lungs. Physical and sexual activity will be  restricted in varying degrees for an indeterminate period of time but most often 2-6 weeks.  Finally, she understands that it is impossible to list every possible undesirable effect and that the condition for which surgery is done is not always cured or significantly improved, and in rare cases may be even worse.Ample time was given to answer all questions.  Annamarie MajorPaul Sujata Maines, MD, Merlinda FrederickFACOG [facility], [facility] 12/29/2017  8:31 AM

## 2017-12-29 NOTE — H&P (View-Only) (Signed)
PRE-OPERATIVE HISTORY AND PHYSICAL EXAM  HPI:  Danielle Pollard is a 32 y.o. G1P1001 No LMP recorded. Patient has had an injection.; she is being admitted for surgery related to pelvic pain. Her pain is localized to the periumbilical and deep pelvis area, described as constant, began several months ago (although also had pelvic pain starting in 2012; including diagnostic laparoscopy and LOA then) and its severity is described as severe. The pain radiates to the  bilateral lower legs. She has these associated symptoms which include headache. Patient has these modifiers which include NSAIDS but min relief that make it better and unable to associate with any factor that make it worse.  Context includes: spontaneous.  Depo suppresses periods and helps with migraine headaches but not w pelvic pain, has been on (again) for the last 12 mos.  No desire for fertility.  Previous evaluation: CT, [place], empiric ABX use for PID. No dx or help.  Prior Diagnosis: prior pelvic surgery and polycystic ovary syndrome.  PMHx: Past Medical History:  Diagnosis Date  . Asthma   . Migraines   . Sickle cell trait [facility])    Past Surgical History:  Procedure Laterality Date  . CESAREAN SECTION    . DIAGNOSTIC LAPAROSCOPY  2012   Dr. Luella Cook; lysis of adhesions   Family History  Problem Relation Age of Onset  . Hypertension Maternal Grandmother   . Hypertension Maternal Grandfather    Social History   Tobacco Use  . Smoking status: Never Smoker  . Smokeless tobacco: Never Used  Substance Use Topics  . Alcohol use: No  . Drug use: No    Current Outpatient Medications:  .  acetaminophen (TYLENOL) 500 MG tablet, Take 1,000 mg by mouth every 6 (six) hours as needed (for pain.)., Disp: , Rfl:  .  medroxyPROGESTERone (DEPO-PROVERA) 150 MG/ML injection, Inject 150 mg into the muscle every 3 (three) months., Disp: , Rfl:  .  meloxicam (MOBIC) 15 MG tablet, Take 1 tablet (15 mg total) by mouth 2 (two) times  daily as needed for pain., Disp: 60 tablet, Rfl: 3 .  traMADol (ULTRAM) 50 MG tablet, Take 1 tablet (50 mg total) by mouth every 6 (six) hours as needed. (Patient taking differently: Take 50 mg by mouth every 6 (six) hours as needed (for pain.). ), Disp: 20 tablet, Rfl: 0 Allergies: Patient has no known allergies.  Review of Systems  Constitutional: Negative for chills, fever and malaise/fatigue.  HENT: Negative for congestion, sinus pain and sore throat.   Eyes: Negative for blurred vision and pain.  Respiratory: Negative for cough and wheezing.   Cardiovascular: Negative for chest pain and leg swelling.  Gastrointestinal: Negative for abdominal pain, constipation, diarrhea, heartburn, nausea and vomiting.  Genitourinary: Negative for dysuria, frequency, hematuria and urgency.  Musculoskeletal: Negative for back pain, joint pain, myalgias and neck pain.  Skin: Negative for itching and rash.  Neurological: Negative for dizziness, tremors and weakness.  Endo/Heme/Allergies: Does not bruise/bleed easily.  Psychiatric/Behavioral: Negative for depression. The patient is not nervous/anxious and does not have insomnia.     Objective: BP 120/80   Ht 5\' 5"  (1.651 m)   Wt 200 lb (90.7 kg)   BMI 33.28 kg/m   Filed Weights   12/29/17 0818  Weight: 200 lb (90.7 kg)   Physical Exam  Constitutional: She is oriented to person, place, and time. She appears well-developed and well-nourished. No distress.  HENT:  Head: Normocephalic and atraumatic. Head is without laceration.  Right Ear: Hearing normal.  Left Ear: Hearing normal.  Nose: No epistaxis.  No foreign bodies.  Mouth/Throat: Uvula is midline, oropharynx is clear and moist and mucous membranes are normal.  Eyes: Pupils are equal, round, and reactive to light.  Neck: Normal range of motion. Neck supple. No thyromegaly present.  Cardiovascular: Normal rate and regular rhythm. Exam reveals no gallop and no friction rub.  No murmur  heard. Pulmonary/Chest: Effort normal and breath sounds normal. No respiratory distress. She has no wheezes. Right breast exhibits no mass, no skin change and no tenderness. Left breast exhibits no mass, no skin change and no tenderness.  Abdominal: Soft. Bowel sounds are normal. She exhibits no distension. There is no tenderness. There is no rebound.  Musculoskeletal: Normal range of motion.  Neurological: She is alert and oriented to person, place, and time. No cranial nerve deficit.  Skin: Skin is warm and dry.  Psychiatric: She has a normal mood and affect. Judgment normal.  Vitals reviewed.  Assessment: 1. Chronic pelvic pain in female   Options for diagnostic laparoscopy and cystoscopy, vs hysterectomy, discussed, and pt prefers hysterectomy as well as any necessary procedure to treat and prevent future pelvic pain.  Pt does not want any future pregnancies and has been dealing with pain for years off and on and more so recently.  I have had a careful discussion with this patient about all the options available and the risk/benefits of each. I have fully informed this patient that surgery may subject her to a variety of discomforts and risks: She understands that most patients have surgery with little difficulty, but problems can happen ranging from minor to fatal. These include nausea, vomiting, pain, bleeding, infection, poor healing, hernia, or formation of adhesions. Unexpected reactions may occur from any drug or anesthetic given. Unintended injury may occur to other pelvic or abdominal structures such as Fallopian tubes, ovaries, bladder, ureter (tube from kidney to bladder), or bowel. Nerves going from the pelvis to the legs may be injured. Any such injury may require immediate or later additional surgery to correct the problem. Excessive blood loss requiring transfusion is very unlikely but possible. Dangerous blood clots may form in the legs or lungs. Physical and sexual activity will be  restricted in varying degrees for an indeterminate period of time but most often 2-6 weeks.  Finally, she understands that it is impossible to list every possible undesirable effect and that the condition for which surgery is done is not always cured or significantly improved, and in rare cases may be even worse.Ample time was given to answer all questions.  Annamarie MajorPaul Momina Hunton, MD, Merlinda FrederickFACOG [facility], [facility] 12/29/2017  8:31 AM

## 2017-12-30 ENCOUNTER — Encounter
Admission: RE | Admit: 2017-12-30 | Discharge: 2017-12-30 | Disposition: A | Discharge status: Home / Self Care | Payer: Medicaid Other | Source: Ambulatory Visit | Attending: Obstetrics & Gynecology | Admitting: Obstetrics & Gynecology

## 2017-12-30 DIAGNOSIS — R102 Pelvic and perineal pain: Secondary | ICD-10-CM | POA: Diagnosis not present

## 2017-12-30 DIAGNOSIS — Z0183 Encounter for blood typing: Secondary | ICD-10-CM | POA: Insufficient documentation

## 2017-12-30 DIAGNOSIS — Z01812 Encounter for preprocedural laboratory examination: Secondary | ICD-10-CM | POA: Diagnosis present

## 2017-12-30 DIAGNOSIS — G8929 Other chronic pain: Secondary | ICD-10-CM | POA: Insufficient documentation

## 2017-12-30 DIAGNOSIS — D573 Sickle-cell trait: Secondary | ICD-10-CM | POA: Insufficient documentation

## 2017-12-30 LAB — CBC
HEMATOCRIT: 37.8 % (ref 35.0–47.0)
HEMOGLOBIN: 13.2 g/dL (ref 12.0–16.0)
MCH: 32.2 pg (ref 26.0–34.0)
MCHC: 35 g/dL (ref 32.0–36.0)
MCV: 92.1 fL (ref 80.0–100.0)
PLATELETS: 247 10*3/uL (ref 150–440)
RBC: 4.11 MIL/uL (ref 3.80–5.20)
RDW: 12.6 % (ref 11.5–14.5)
WBC: 4.3 10*3/uL (ref 3.6–11.0)

## 2017-12-30 LAB — TYPE AND SCREEN
ABO/RH(D): O POS
Antibody Screen: NEGATIVE

## 2018-01-04 MED ORDER — CEFOXITIN SODIUM-DEXTROSE 2-2.2 GM-%(50ML) IV SOLR
2.0000 g | INTRAVENOUS | Status: AC
  Administered 2018-01-05: 2 g via INTRAVENOUS

## 2018-01-05 ENCOUNTER — Ambulatory Visit: Payer: Medicaid Other | Admitting: Certified Registered"

## 2018-01-05 ENCOUNTER — Ambulatory Visit
Admission: RE | Admit: 2018-01-05 | Discharge: 2018-01-05 | Disposition: A | Discharge status: Home / Self Care | Payer: Medicaid Other | Source: Ambulatory Visit | Attending: Obstetrics & Gynecology | Admitting: Obstetrics & Gynecology

## 2018-01-05 ENCOUNTER — Encounter: Payer: Self-pay | Admitting: Certified Registered"

## 2018-01-05 ENCOUNTER — Encounter
Admission: RE | Disposition: A | Discharge status: Home / Self Care | Payer: Self-pay | Source: Ambulatory Visit | Attending: Obstetrics & Gynecology

## 2018-01-05 ENCOUNTER — Other Ambulatory Visit: Payer: Self-pay

## 2018-01-05 DIAGNOSIS — R102 Pelvic and perineal pain: Secondary | ICD-10-CM | POA: Insufficient documentation

## 2018-01-05 DIAGNOSIS — J45909 Unspecified asthma, uncomplicated: Secondary | ICD-10-CM | POA: Diagnosis not present

## 2018-01-05 DIAGNOSIS — G8929 Other chronic pain: Secondary | ICD-10-CM

## 2018-01-05 DIAGNOSIS — D573 Sickle-cell trait: Secondary | ICD-10-CM | POA: Insufficient documentation

## 2018-01-05 DIAGNOSIS — Z79899 Other long term (current) drug therapy: Secondary | ICD-10-CM | POA: Diagnosis not present

## 2018-01-05 HISTORY — PX: LAPAROSCOPIC HYSTERECTOMY: SHX1926

## 2018-01-05 HISTORY — PX: LAPAROSCOPY: SHX197

## 2018-01-05 LAB — ABO/RH: ABO/RH(D): O POS

## 2018-01-05 LAB — POCT PREGNANCY, URINE: Preg Test, Ur: NEGATIVE

## 2018-01-05 SURGERY — LAPAROSCOPY, DIAGNOSTIC
Anesthesia: General | Wound class: Clean Contaminated

## 2018-01-05 MED ORDER — DEXAMETHASONE SODIUM PHOSPHATE 10 MG/ML IJ SOLN
INTRAMUSCULAR | Status: DC | PRN
  Administered 2018-01-05: 10 mg via INTRAVENOUS

## 2018-01-05 MED ORDER — DEXAMETHASONE SODIUM PHOSPHATE 10 MG/ML IJ SOLN
INTRAMUSCULAR | Status: AC
  Filled 2018-01-05: qty 1

## 2018-01-05 MED ORDER — OXYCODONE-ACETAMINOPHEN 5-325 MG PO TABS: 1.0000 | ORAL_TABLET | ORAL | 0 refills | Status: DC | PRN

## 2018-01-05 MED ORDER — ACETAMINOPHEN 325 MG PO TABS
650.0000 mg | ORAL_TABLET | ORAL | Status: DC | PRN
  Administered 2018-01-05: 650 mg via ORAL

## 2018-01-05 MED ORDER — MORPHINE SULFATE (PF) 4 MG/ML IV SOLN: 1.0000 mg | INTRAVENOUS | Status: DC | PRN

## 2018-01-05 MED ORDER — FENTANYL CITRATE (PF) 100 MCG/2ML IJ SOLN
INTRAMUSCULAR | Status: AC
  Administered 2018-01-05: 25 ug via INTRAVENOUS
  Filled 2018-01-05: qty 2

## 2018-01-05 MED ORDER — PHENYLEPHRINE HCL 10 MG/ML IJ SOLN
INTRAMUSCULAR | Status: AC
  Filled 2018-01-05: qty 1

## 2018-01-05 MED ORDER — PROPOFOL 10 MG/ML IV BOLUS
INTRAVENOUS | Status: AC
  Filled 2018-01-05: qty 20

## 2018-01-05 MED ORDER — ONDANSETRON HCL 4 MG/2ML IJ SOLN: 4.0000 mg | Freq: Once | INTRAMUSCULAR | Status: DC | PRN

## 2018-01-05 MED ORDER — ROCURONIUM BROMIDE 100 MG/10ML IV SOLN
INTRAVENOUS | Status: DC | PRN
  Administered 2018-01-05: 40 mg via INTRAVENOUS

## 2018-01-05 MED ORDER — PHENYLEPHRINE HCL 10 MG/ML IJ SOLN
INTRAMUSCULAR | Status: DC | PRN
  Administered 2018-01-05: 200 ug via INTRAVENOUS

## 2018-01-05 MED ORDER — OXYCODONE-ACETAMINOPHEN 5-325 MG PO TABS: 1.0000 | ORAL_TABLET | ORAL | Status: DC | PRN

## 2018-01-05 MED ORDER — FENTANYL CITRATE (PF) 100 MCG/2ML IJ SOLN
INTRAMUSCULAR | Status: AC
  Filled 2018-01-05: qty 2

## 2018-01-05 MED ORDER — MIDAZOLAM HCL 2 MG/2ML IJ SOLN
INTRAMUSCULAR | Status: AC
  Filled 2018-01-05: qty 2

## 2018-01-05 MED ORDER — FAMOTIDINE 20 MG PO TABS
20.0000 mg | ORAL_TABLET | Freq: Once | ORAL | Status: AC
  Administered 2018-01-05: 20 mg via ORAL

## 2018-01-05 MED ORDER — ROCURONIUM BROMIDE 50 MG/5ML IV SOLN
INTRAVENOUS | Status: AC
  Filled 2018-01-05: qty 1

## 2018-01-05 MED ORDER — KETOROLAC TROMETHAMINE 30 MG/ML IJ SOLN
30.0000 mg | Freq: Four times a day (QID) | INTRAMUSCULAR | Status: DC
  Administered 2018-01-05: 30 mg via INTRAVENOUS

## 2018-01-05 MED ORDER — LACTATED RINGERS IV SOLN
INTRAVENOUS | Status: DC
  Administered 2018-01-05: 08:00:00 via INTRAVENOUS

## 2018-01-05 MED ORDER — SUCCINYLCHOLINE CHLORIDE 20 MG/ML IJ SOLN
INTRAMUSCULAR | Status: AC
  Filled 2018-01-05: qty 1

## 2018-01-05 MED ORDER — OXYCODONE-ACETAMINOPHEN 5-325 MG PO TABS
1.0000 | ORAL_TABLET | Freq: Once | ORAL | Status: AC
  Administered 2018-01-05: 1 via ORAL

## 2018-01-05 MED ORDER — LACTATED RINGERS IV SOLN
INTRAVENOUS | Status: DC | PRN
  Administered 2018-01-05: 09:00:00 via INTRAVENOUS

## 2018-01-05 MED ORDER — BUPIVACAINE HCL 0.5 % IJ SOLN
INTRAMUSCULAR | Status: DC | PRN
Start: 2018-01-05 — End: 2018-01-05
  Administered 2018-01-05: 13 mL

## 2018-01-05 MED ORDER — FENTANYL CITRATE (PF) 100 MCG/2ML IJ SOLN
INTRAMUSCULAR | Status: DC | PRN
  Administered 2018-01-05: 100 ug via INTRAVENOUS
  Administered 2018-01-05 (×2): 50 ug via INTRAVENOUS

## 2018-01-05 MED ORDER — LACTATED RINGERS IV SOLN: INTRAVENOUS | Status: DC

## 2018-01-05 MED ORDER — ACETAMINOPHEN 650 MG RE SUPP
650.0000 mg | RECTAL | Status: DC | PRN
  Filled 2018-01-05: qty 1

## 2018-01-05 MED ORDER — KETOROLAC TROMETHAMINE 30 MG/ML IJ SOLN
INTRAMUSCULAR | Status: AC
  Filled 2018-01-05: qty 1

## 2018-01-05 MED ORDER — DEXMEDETOMIDINE HCL IN NACL 80 MCG/20ML IV SOLN
INTRAVENOUS | Status: AC
  Filled 2018-01-05: qty 20

## 2018-01-05 MED ORDER — DEXMEDETOMIDINE HCL 200 MCG/2ML IV SOLN
INTRAVENOUS | Status: DC | PRN
  Administered 2018-01-05: 8 ug via INTRAVENOUS

## 2018-01-05 MED ORDER — ACETAMINOPHEN 325 MG PO TABS
ORAL_TABLET | ORAL | Status: AC
  Filled 2018-01-05: qty 2

## 2018-01-05 MED ORDER — PROPOFOL 10 MG/ML IV BOLUS
INTRAVENOUS | Status: DC | PRN
  Administered 2018-01-05: 150 mg via INTRAVENOUS

## 2018-01-05 MED ORDER — OXYCODONE-ACETAMINOPHEN 5-325 MG PO TABS
ORAL_TABLET | ORAL | Status: AC
  Filled 2018-01-05: qty 1

## 2018-01-05 MED ORDER — FENTANYL CITRATE (PF) 100 MCG/2ML IJ SOLN
25.0000 ug | INTRAMUSCULAR | Status: AC | PRN
  Administered 2018-01-05 (×6): 25 ug via INTRAVENOUS

## 2018-01-05 MED ORDER — MIDAZOLAM HCL 2 MG/2ML IJ SOLN
INTRAMUSCULAR | Status: DC | PRN
  Administered 2018-01-05: 2 mg via INTRAVENOUS

## 2018-01-05 MED ORDER — CEFOXITIN SODIUM-DEXTROSE 2-2.2 GM-%(50ML) IV SOLR
INTRAVENOUS | Status: AC
  Filled 2018-01-05: qty 50

## 2018-01-05 MED ORDER — FAMOTIDINE 20 MG PO TABS
ORAL_TABLET | ORAL | Status: AC
  Filled 2018-01-05: qty 1

## 2018-01-05 SURGICAL SUPPLY — 53 items
BAG COUNTER SPONGE EZ (MISCELLANEOUS) ×3 IMPLANT
BAG URINE DRAINAGE (UROLOGICAL SUPPLIES) ×4 IMPLANT
BLADE SURG SZ11 CARB STEEL (BLADE) ×4 IMPLANT
CANISTER SUCT 1200ML W/VALVE (MISCELLANEOUS) ×4 IMPLANT
CATH FOLEY 2WAY  5CC 16FR (CATHETERS) ×2
CATH ROBINSON RED A/P 16FR (CATHETERS) ×4 IMPLANT
CATH URTH 16FR FL 2W BLN LF (CATHETERS) ×2 IMPLANT
CHLORAPREP W/TINT 26ML (MISCELLANEOUS) ×4 IMPLANT
COUNTER SPONGE BAG EZ (MISCELLANEOUS) ×1
DEFOGGER SCOPE WARMER CLEARIFY (MISCELLANEOUS) ×4 IMPLANT
DERMABOND ADVANCED (GAUZE/BANDAGES/DRESSINGS) ×2
DERMABOND ADVANCED .7 DNX12 (GAUZE/BANDAGES/DRESSINGS) ×2 IMPLANT
DEVICE SUTURE ENDOST 10MM (ENDOMECHANICALS) ×4 IMPLANT
DRAPE CAMERA CLOSED 9X96 (DRAPES) ×4 IMPLANT
DRSG TEGADERM 2-3/8X2-3/4 SM (GAUZE/BANDAGES/DRESSINGS) ×12 IMPLANT
GLOVE BIO SURGEON STRL SZ8 (GLOVE) ×20 IMPLANT
GLOVE INDICATOR 8.0 STRL GRN (GLOVE) ×4 IMPLANT
GOWN STRL REUS W/ TWL LRG LVL3 (GOWN DISPOSABLE) ×2 IMPLANT
GOWN STRL REUS W/ TWL XL LVL3 (GOWN DISPOSABLE) ×4 IMPLANT
GOWN STRL REUS W/TWL LRG LVL3 (GOWN DISPOSABLE) ×2
GOWN STRL REUS W/TWL XL LVL3 (GOWN DISPOSABLE) ×4
GRASPER SUT TROCAR 14GX15 (MISCELLANEOUS) ×4 IMPLANT
IRRIGATION STRYKERFLOW (MISCELLANEOUS) ×2 IMPLANT
IRRIGATOR STRYKERFLOW (MISCELLANEOUS) ×4
IV LACTATED RINGERS 1000ML (IV SOLUTION) ×8 IMPLANT
KIT PINK PAD W/HEAD ARE REST (MISCELLANEOUS) ×4
KIT PINK PAD W/HEAD ARM REST (MISCELLANEOUS) ×2 IMPLANT
KIT TURNOVER CYSTO (KITS) ×4 IMPLANT
LABEL OR SOLS (LABEL) ×4 IMPLANT
MANIPULATOR VCARE SML CRV RETR (MISCELLANEOUS) ×4 IMPLANT
NEEDLE VERESS 14GA 120MM (NEEDLE) ×4 IMPLANT
NS IRRIG 500ML POUR BTL (IV SOLUTION) ×4 IMPLANT
OCCLUDER COLPOPNEUMO (BALLOONS) ×4 IMPLANT
PACK GYN LAPAROSCOPIC (MISCELLANEOUS) ×4 IMPLANT
PAD OB MATERNITY 4.3X12.25 (PERSONAL CARE ITEMS) ×4 IMPLANT
PAD PREP 24X41 OB/GYN DISP (PERSONAL CARE ITEMS) ×4 IMPLANT
SCISSORS METZENBAUM CVD 33 (INSTRUMENTS) ×4 IMPLANT
SET CYSTO W/LG BORE CLAMP LF (SET/KITS/TRAYS/PACK) ×4 IMPLANT
SHEARS HARMONIC ACE PLUS 36CM (ENDOMECHANICALS) ×4 IMPLANT
SLEEVE ENDOPATH XCEL 5M (ENDOMECHANICALS) ×8 IMPLANT
SPONGE GAUZE 2X2 8PLY STER LF (GAUZE/BANDAGES/DRESSINGS) ×2
SPONGE GAUZE 2X2 8PLY STRL LF (GAUZE/BANDAGES/DRESSINGS) ×6 IMPLANT
SUT ENDO VLOC 180-0-8IN (SUTURE) ×8 IMPLANT
SUT VIC AB 0 CT1 36 (SUTURE) ×4 IMPLANT
SUT VIC AB 2-0 UR6 27 (SUTURE) ×4 IMPLANT
SUT VIC AB 4-0 FS2 27 (SUTURE) ×4 IMPLANT
SUT VIC AB 4-0 PS2 18 (SUTURE) ×8 IMPLANT
SYR 10ML LL (SYRINGE) ×8 IMPLANT
SYR 50ML LL SCALE MARK (SYRINGE) ×4 IMPLANT
TROCAR ENDO BLADELESS 11MM (ENDOMECHANICALS) ×4 IMPLANT
TROCAR XCEL NON-BLD 5MMX100MML (ENDOMECHANICALS) ×4 IMPLANT
TUBING INSUF HEATED (TUBING) ×4 IMPLANT
TUBING INSUFFLATION (TUBING) ×4 IMPLANT

## 2018-01-05 NOTE — Anesthesia Preprocedure Evaluation (Signed)
Anesthesia Evaluation  Patient identified by MRN, date of birth, ID band Patient awake    Reviewed: Allergy & Precautions, NPO status , Patient's Chart, lab work & pertinent test results  History of Anesthesia Complications Negative for: history of anesthetic complications  Airway Mallampati: III       Dental   Pulmonary asthma , neg sleep apnea, neg COPD,           Cardiovascular (-) hypertension(-) Past MI and (-) CHF (-) dysrhythmias (-) Valvular Problems/Murmurs     Neuro/Psych neg Seizures    GI/Hepatic Neg liver ROS, neg GERD  ,  Endo/Other  neg diabetes  Renal/GU negative Renal ROS     Musculoskeletal   Abdominal   Peds  Hematology  (+) anemia ,   Anesthesia Other Findings   Reproductive/Obstetrics                             Anesthesia Physical Anesthesia Plan  ASA: III  Anesthesia Plan: General   Post-op Pain Management:    Induction:   PONV Risk Score and Plan: 2  Airway Management Planned: Oral ETT  Additional Equipment:   Intra-op Plan:   Post-operative Plan:   Informed Consent: I have reviewed the patients History and Physical, chart, labs and discussed the procedure including the risks, benefits and alternatives for the proposed anesthesia with the patient or authorized representative who has indicated his/her understanding and acceptance.     Plan Discussed with:   Anesthesia Plan Comments:         Anesthesia Quick Evaluation

## 2018-01-05 NOTE — Anesthesia Postprocedure Evaluation (Signed)
Anesthesia Post Note  Patient: Danielle SavinBrandy M Pollard  Procedure(s) Performed: LAPAROSCOPY DIAGNOSTIC (N/A ) HYSTERECTOMY TOTAL LAPAROSCOPIC, BILATERAL SALPINGECTOMY (Bilateral )  Patient location during evaluation: PACU Anesthesia Type: General Level of consciousness: awake and alert Pain management: pain level controlled Vital Signs Assessment: post-procedure vital signs reviewed and stable Respiratory status: spontaneous breathing and respiratory function stable Cardiovascular status: stable Anesthetic complications: no     Last Vitals:  Vitals:   01/05/18 1148 01/05/18 1308  BP: 114/62 133/62  Pulse: 85 81  Resp: 18 16  Temp: 36.5 C 36.5 C  SpO2: 98% 99%    Last Pain:  Vitals:   01/05/18 1308  TempSrc: Oral  PainSc: 7                  KEPHART,WILLIAM K

## 2018-01-05 NOTE — Transfer of Care (Signed)
Immediate Anesthesia Transfer of Care Note  Patient: Danielle SavinBrandy M Pollard  Procedure(s) Performed: LAPAROSCOPY DIAGNOSTIC (N/A ) HYSTERECTOMY TOTAL LAPAROSCOPIC, BILATERAL SALPINGECTOMY (Bilateral )  Patient Location: PACU  Anesthesia Type:General  Level of Consciousness: awake and sedated  Airway & Oxygen Therapy: Patient Spontanous Breathing and Patient connected to face mask oxygen  Post-op Assessment: Report given to RN and Post -op Vital signs reviewed and stable  Post vital signs: Reviewed and stable  Last Vitals:  Vitals Value Taken Time  BP    Temp    Pulse    Resp    SpO2      Last Pain:  Vitals:   01/05/18 0831  TempSrc: Temporal  PainSc: 0-No pain         Complications: No apparent anesthesia complications

## 2018-01-05 NOTE — Anesthesia Post-op Follow-up Note (Signed)
Anesthesia QCDR form completed.        

## 2018-01-05 NOTE — Interval H&P Note (Signed)
History and Physical Interval Note:  01/05/2018 8:37 AM  Danielle Pollard  has presented today for surgery, with the diagnosis of CHRONIC PELVIC PAIN.  The various methods of treatment have been discussed with the patient and family. After consideration of risks, benefits and other options for treatment, the patient has consented to  Procedure(s): HYSTERECTOMY TOTAL LAPAROSCOPIC, BILATERAL SALPINGECTOMY (Bilateral) as a surgical intervention .  The patient's history has been reviewed, patient examined, no change in status, stable for surgery.  I have reviewed the patient's chart and labs.  Questions were answered to the patient's satisfaction.  Pt desires hysterectomy (preserve ovaries) as definitive surgical procedure to assist with her treatment of pelvic pain.  Letitia Libraobert Paul Emy Angevine

## 2018-01-05 NOTE — Op Note (Signed)
Operative Report:  PRE-OP DIAGNOSIS: CHRONIC PELVIC PAIN   POST-OP DIAGNOSIS: CHRONIC PELVIC PAIN   PROCEDURE: Procedure(s): HYSTERECTOMY TOTAL LAPAROSCOPIC, BILATERAL SALPINGECTOMY CYSTOSCOPY  SURGEON: Annamarie Major, MD, FACOG  ASSISTANT: Dr Edison Pace, No other capable assistant available, in surgery requiring high level assistant.  ANESTHESIA: General endotracheal anesthesia  ESTIMATED BLOOD LOSS: less than 50   SPECIMENS: Uterus, Tubes.  COMPLICATIONS: None  DISPOSITION: stable to PACU  FINDINGS: Intraabdominal adhesions were not noted. Ovaries normal.  Appendix normal.  PROCEDURE:  The patient was taken to the OR where anesthesia was administed. She was prepped and draped in the normal sterile fashion in the dorsal lithotomy position in the [place] stirrups. A time out was performed. A Graves speculum was inserted, the cervix was grasped with a single tooth tenaculum and the endometrial cavity was sounded. The cervix was progressively dilated to a size 18 [place] with [employer] dilators. A V-Care uterine manipulator was inserted in the usual fashion without incident. Gloves were changed and attention was turned to the abdomen.   An infraumbilical transverse 5mm skin incision was made with the scalpel after local anesthesia applied to the skin. A Veress-step needle was inserted in the usual fashion and confirmed using the hanging drop technique. A pneumoperitoneum was obtained by insufflation of CO2 (opening pressure of ) to . A diagnostic laparoscopy was performed yielding the previously described findings. Attention was turned to the left lower quadrant where after visualization of the inferior epigastric vessels a 5mm skin incision was made with the scalpel. A 5 mm laparoscopic port was inserted. The same procedure was repeated in the right lower quadrant with a 11mm trocar. Attention was turned to the left aspect of the uterus, where after visualization of the ureter, the round  ligament was coagulated and transected using the 5mm Harmonic Scapel. The anterior and posterior leafs of the broad ligament were dissected off as the anterior one was coagulated and transected in a caudal direction towards the cuff of the uterine manipulator.  Attention was then turned to the left fallopian tube which was recognized by visualization of the fimbria. The tube is excised to its attachment to the uterus. The uterine-ovarian ligament and its blood vessels were carefully coagulated and transected using the Harmonic scapel.  Attention was turned to the right aspect of the uterus where the same procedure was performed.  The vesicouterine reflection of the peritoneum was dissected with the harmonic scapel and the bladder flap was created bluntly.  The uterine vessels were coagulated and transected bilaterally using first bipolar cautery and then the harmonic scapel. A 360 degree, circumferential colpotomy was done to completely amputate the uterus with cervix and tubes. Once the specimen was amputated it was delivered through the vagina.   The colpotomy was repaired in a simple running fashion using a delayed absorbable suture with an endo-stitch device.  Vaginal exam confirms complete closure.  The cavity was copiously irrigated. A survey of the pelvic cavity revealed adequate hemostasis and no injury to bowel, bladder, or ureter.   A diagnostic cystoscopy was performed using saline distension of bladder with no lesions or injuries noted.  Bilateral urine flow from each ureteral orifice is visualized.  At this point the procedure was finalized. Right lower quadrant fascia incision is closed with a vicryl suture using the fascia closure device. All the instruments were removed from the patient's body. Gas was expelled and patient is leveled.  Incisions are closed with skin adhesive.    Patient goes to recovery  room in stable condition.  All sponge, instrument, and needle counts are correct x2.      Annamarie MajorPaul Lasonja Lakins, MD, Merlinda FrederickFACOG [facility], [facility] 01/05/2018  10:36 AM

## 2018-01-05 NOTE — Discharge Instructions (Signed)
Total Laparoscopic Hysterectomy, Care After °Refer to this sheet in the next few weeks. These instructions provide you with information on caring for yourself after your procedure. Your health care provider may also give you more specific instructions. Your treatment has been planned according to current medical practices, but problems sometimes occur. Call your health care provider if you have any problems or questions after your procedure. °What can I expect after the procedure? °· Pain and bruising at the incision sites. You will be given pain medicine to control it. °· Menopausal symptoms such as hot flashes, night sweats, and insomnia if your ovaries were removed. °· Sore throat from the breathing tube that was inserted during surgery. °Follow these instructions at home: °· Only take over-the-counter or prescription medicines for pain, discomfort, or fever as directed by your health care provider. °· Do not take aspirin. It can cause bleeding. °· Do not drive when taking pain medicine. °· Follow your health care provider's advice regarding diet, exercise, lifting, driving, and general activities. °· Resume your usual diet as directed and allowed. °· Get plenty of rest and sleep. °· Do not douche, use tampons, or have sexual intercourse for at least 6 weeks, or until your health care provider gives you permission. °· Change your bandages (dressings) as directed by your health care provider. °· Monitor your temperature and notify your health care provider of a fever. °· Take showers instead of baths for 2-3 weeks. °· Do not drink alcohol until your health care provider gives you permission. °· If you develop constipation, you may take a mild laxative with your health care provider's permission. Bran foods may help with constipation problems. Drinking enough fluids to keep your urine clear or pale yellow may help as well. °· Try to have someone home with you for 1-2 weeks to help around the house. °· Keep all of  your follow-up appointments as directed by your health care provider. °Contact a health care provider if: °· You have swelling, redness, or increasing pain around your incision sites. °· You have pus coming from your incision. °· You notice a bad smell coming from your incision. °· Your incision breaks open. °· You feel dizzy or lightheaded. °· You have pain or bleeding when you urinate. °· You have persistent diarrhea. °· You have persistent nausea and vomiting. °· You have abnormal vaginal discharge. °· You have a rash. °· You have any type of abnormal reaction or develop an allergy to your medicine. °· You have poor pain control with your prescribed medicine. °Get help right away if: °· You have chest pain or shortness of breath. °· You have severe abdominal pain that is not relieved with pain medicine. °· You have pain or swelling in your legs. °This information is not intended to replace advice given to you by your health care provider. Make sure you discuss any questions you have with your health care provider. °Document Released: 05/04/2013 Document Revised: 12/20/2015 Document Reviewed: 02/01/2013 °Elsevier Interactive Patient Education © 2017 [employer] ° °AMBULATORY SURGERY  °DISCHARGE INSTRUCTIONS ° ° °1) The drugs that you were given will stay in your system until tomorrow so for the next 24 hours you should not: ° °A) Drive an automobile °B) Make any legal decisions °C) Drink any alcoholic beverage ° ° °2) You may resume regular meals tomorrow.  Today it is better to start with liquids and gradually work up to solid foods. ° °You may eat anything you prefer, but it is better   to start with liquids, then soup and crackers, and gradually work up to solid foods. ° ° °3) Please notify your doctor immediately if you have any unusual bleeding, trouble breathing, redness and pain at the surgery site, drainage, fever, or pain not relieved by medication. ° ° ° °4) Additional Instructions: ° ° ° ° ° ° ° °Please  contact your physician with any problems or Same Day Surgery at [phone], Monday through Friday 6 am to 4 pm, or [place] [facility] [place] Main number at [phone]. ° °

## 2018-01-05 NOTE — Anesthesia Procedure Notes (Signed)
Procedure Name: Intubation Date/Time: 01/05/2018 9:19 AM Performed by: Jonna Clark, CRNA Pre-anesthesia Checklist: Patient identified, Patient being monitored, Timeout performed, Emergency Drugs available and Suction available Patient Re-evaluated:Patient Re-evaluated prior to induction Oxygen Delivery Method: Circle system utilized Preoxygenation: Pre-oxygenation with 100% oxygen Induction Type: IV induction Ventilation: Mask ventilation without difficulty Laryngoscope Size: Mac and 3 Grade View: Grade I Tube type: Oral Tube size: 7.0 mm Number of attempts: 1 Airway Equipment and Method: Stylet Placement Confirmation: ETT inserted through vocal cords under direct vision,  positive ETCO2 and breath sounds checked- equal and bilateral Secured at: 22 cm Tube secured with: Tape Dental Injury: Teeth and Oropharynx as per pre-operative assessment

## 2018-01-06 LAB — SURGICAL PATHOLOGY

## 2018-01-07 ENCOUNTER — Encounter: Payer: Self-pay | Admitting: Obstetrics & Gynecology

## 2018-01-07 ENCOUNTER — Telehealth: Payer: Self-pay

## 2018-01-07 ENCOUNTER — Ambulatory Visit (INDEPENDENT_AMBULATORY_CARE_PROVIDER_SITE_OTHER): Payer: Medicaid Other | Admitting: Obstetrics & Gynecology

## 2018-01-07 VITALS — BP 120/80 | HR 82 | Ht 65.0 in | Wt 202.0 lb

## 2018-01-07 DIAGNOSIS — G8918 Other acute postprocedural pain: Secondary | ICD-10-CM

## 2018-01-07 MED ORDER — TRAMADOL HCL 50 MG PO TABS: 50.0000 mg | ORAL_TABLET | Freq: Four times a day (QID) | ORAL | 0 refills | Status: DC | PRN

## 2018-01-07 MED ORDER — IBUPROFEN 600 MG PO TABS: 600.0000 mg | ORAL_TABLET | Freq: Four times a day (QID) | ORAL | 3 refills | Status: DC | PRN

## 2018-01-07 NOTE — Patient Instructions (Signed)
Mobic or Ibuprofen  Percocet or Tramadol  Tylenol OK anytime

## 2018-01-07 NOTE — Telephone Encounter (Signed)
Spoke w/pt. She states the pain meds are not working. She is alternating pain meds & tylenol (oxycontin q3h Tylenol 500mg  q6h). She spoke w/SDS nurse yesterday who advised her to keep her feet elevated as well as supporting surgical sight when she moves. She has been doing this but nothing is helping.

## 2018-01-07 NOTE — Telephone Encounter (Signed)
Appt after 220

## 2018-01-07 NOTE — Progress Notes (Signed)
  Postoperative Follow-up [facility].  Subjective: Patient reports having RLQ pain that is near surface.  No radiation.  No flank pain or RUQ pain.  No fever, dysuria, oliguria, problems w BMs, VB.   Objective: BP 120/80   Pulse 82   Ht 5\' 5"  (1.651 m)   Wt 202 lb (91.6 kg)   BMI 33.61 kg/m  Physical Exam  Constitutional: She is oriented to person, place, and time. She appears well-developed and well-nourished. No distress.  Cardiovascular: Normal rate.  Pulmonary/Chest: Effort normal.  Abdominal: Soft. She exhibits no distension. There is tenderness in the right lower quadrant.  Incisions Healing Well    No erythema, mass, bruising Tender under RLQ incision No deep T No flank T or CVAT  Musculoskeletal: Normal range of motion.  Neurological: She is alert and oriented to person, place, and time. No cranial nerve deficit.  Skin: Skin is warm and dry.  Psychiatric: She has a normal mood and affect.   Assessment: s/p :  total laparoscopic hysterectomy with bilateral salpingectomy with RLQ pain; appears to be incisiop and surgery related; no sign of complication such as to ureter or bowel.  Plan: Change in pain meds discussed and done Heat, Rest F/U tomorrow if no improvement Consider CT for further internal investigation  Activity plan: No heavy lifting.  Letitia Libraobert Paul Harris 01/07/2018, 2:40 PM

## 2018-01-07 NOTE — Telephone Encounter (Signed)
Pt had surgery w/[facility] Tuesday 01/05/18. She is having "crucial pain" on the right side. [id number]#[phone]

## 2018-01-07 NOTE — Telephone Encounter (Signed)
Apt scheduled today at 2:30 per [place] DecSara P. Pt made aware.

## 2018-01-08 ENCOUNTER — Ambulatory Visit: Payer: Medicaid Other | Admitting: Obstetrics & Gynecology

## 2018-01-08 MED ORDER — FLUCONAZOLE 150 MG PO TABS: 150.0000 mg | ORAL_TABLET | Freq: Once | ORAL | 3 refills | Status: AC

## 2018-01-08 NOTE — Telephone Encounter (Signed)
Pt aware.

## 2018-01-08 NOTE — Telephone Encounter (Signed)
Pt canceled appt for today.

## 2018-01-08 NOTE — Telephone Encounter (Signed)
Patient is requesting some medication for yeast infection.

## 2018-01-08 NOTE — Telephone Encounter (Signed)
Let her know medicine for yeast infection call in

## 2018-01-15 ENCOUNTER — Other Ambulatory Visit: Payer: Self-pay

## 2018-01-15 DIAGNOSIS — R509 Fever, unspecified: Secondary | ICD-10-CM | POA: Diagnosis not present

## 2018-01-15 DIAGNOSIS — R1030 Lower abdominal pain, unspecified: Secondary | ICD-10-CM | POA: Diagnosis not present

## 2018-01-15 DIAGNOSIS — Z79899 Other long term (current) drug therapy: Secondary | ICD-10-CM | POA: Insufficient documentation

## 2018-01-15 DIAGNOSIS — J45998 Other asthma: Secondary | ICD-10-CM | POA: Insufficient documentation

## 2018-01-15 DIAGNOSIS — R109 Unspecified abdominal pain: Secondary | ICD-10-CM | POA: Diagnosis present

## 2018-01-15 LAB — COMPREHENSIVE METABOLIC PANEL
ALBUMIN: 3.6 g/dL (ref 3.5–5.0)
ALK PHOS: 66 U/L (ref 38–126)
ALT: 9 U/L — AB (ref 14–54)
AST: 15 U/L (ref 15–41)
Anion gap: 7 (ref 5–15)
BILIRUBIN TOTAL: 0.8 mg/dL (ref 0.3–1.2)
BUN: 6 mg/dL (ref 6–20)
CALCIUM: 8.8 mg/dL — AB (ref 8.9–10.3)
CO2: 21 mmol/L — AB (ref 22–32)
CREATININE: 0.91 mg/dL (ref 0.44–1.00)
Chloride: 108 mmol/L (ref 101–111)
GFR calc Af Amer: >60 mL/min (ref >60)
GFR calc non Af Amer: >60 mL/min (ref >60)
GLUCOSE: 101 mg/dL — AB (ref 65–99)
Potassium: 3.6 mmol/L (ref 3.5–5.1)
SODIUM: 136 mmol/L (ref 135–145)
TOTAL PROTEIN: 7.2 g/dL (ref 6.5–8.1)

## 2018-01-15 LAB — CBC
HCT: 34.9 % — ABNORMAL LOW (ref 35.0–47.0)
Hemoglobin: 12.6 g/dL (ref 12.0–16.0)
MCH: 32.6 pg (ref 26.0–34.0)
MCHC: 36 g/dL (ref 32.0–36.0)
MCV: 90.6 fL (ref 80.0–100.0)
Platelets: 281 10*3/uL (ref 150–440)
RBC: 3.86 MIL/uL (ref 3.80–5.20)
RDW: 12.4 % (ref 11.5–14.5)
WBC: 8.2 10*3/uL (ref 3.6–11.0)

## 2018-01-15 LAB — LIPASE, BLOOD: Lipase: 23 U/L (ref 11–51)

## 2018-01-15 NOTE — ED Triage Notes (Addendum)
Patient reports noticed fever (100.7 at home) at 3 pm today and abdominal cramping.  Concerned might be related to surgery 1 week ago.  Patient reports pain mid lower abdomen.

## 2018-01-16 ENCOUNTER — Emergency Department
Admission: EM | Admit: 2018-01-16 | Discharge: 2018-01-16 | Disposition: A | Discharge status: Home / Self Care | Payer: Medicaid Other | Attending: Emergency Medicine | Admitting: Emergency Medicine

## 2018-01-16 ENCOUNTER — Emergency Department: Payer: Medicaid Other

## 2018-01-16 DIAGNOSIS — R1030 Lower abdominal pain, unspecified: Secondary | ICD-10-CM

## 2018-01-16 MED ORDER — MORPHINE SULFATE (PF) 4 MG/ML IV SOLN
INTRAVENOUS | Status: AC
  Administered 2018-01-16: 4 mg via INTRAVENOUS
  Filled 2018-01-16: qty 1

## 2018-01-16 MED ORDER — MORPHINE SULFATE (PF) 4 MG/ML IV SOLN
4.0000 mg | Freq: Once | INTRAVENOUS | Status: AC
  Administered 2018-01-16: 4 mg via INTRAVENOUS

## 2018-01-16 MED ORDER — HYDROCODONE-ACETAMINOPHEN 5-325 MG PO TABS: 1.0000 | ORAL_TABLET | ORAL | 0 refills | Status: DC | PRN

## 2018-01-16 MED ORDER — IOPAMIDOL (ISOVUE-300) INJECTION 61%
15.0000 mL | INTRAVENOUS | Status: AC
  Administered 2018-01-16 (×2): 15 mL via ORAL

## 2018-01-16 MED ORDER — OXYCODONE-ACETAMINOPHEN 5-325 MG PO TABS
2.0000 | ORAL_TABLET | Freq: Once | ORAL | Status: AC
  Administered 2018-01-16: 2 via ORAL
  Filled 2018-01-16: qty 2

## 2018-01-16 MED ORDER — ONDANSETRON HCL 4 MG/2ML IJ SOLN
4.0000 mg | INTRAMUSCULAR | Status: AC
  Administered 2018-01-16: 4 mg via INTRAVENOUS
  Filled 2018-01-16: qty 2

## 2018-01-16 MED ORDER — IOPAMIDOL (ISOVUE-300) INJECTION 61%
100.0000 mL | Freq: Once | INTRAVENOUS | Status: AC | PRN
  Administered 2018-01-16: 100 mL via INTRAVENOUS

## 2018-01-16 MED ORDER — SODIUM CHLORIDE 0.9 % IV BOLUS
500.0000 mL | INTRAVENOUS | Status: AC
Start: 2018-01-16 — End: 2018-01-16
  Administered 2018-01-16: 500 mL via INTRAVENOUS

## 2018-01-16 NOTE — ED Provider Notes (Signed)
[facility] Emergency Department Provider Note  ____________________________________________   First MD Initiated Contact with Patient 01/16/18 [phone]     (approximate)  I have reviewed the triage vital signs and the nursing notes.   HISTORY  Chief Complaint Abdominal Pain and Fever    HPI Danielle Pollard is a 32 y.o. female whose medical history includes chronic pelvic pain for which she had a laparoscopic hysterectomy 4 to 5 days ago by Dr. Tiburcio PeaHarris.  She presents tonight for evaluation of persistent and gradually worsening pain in her lower abdomen, primarily in the right lower quadrant.  The pain has become severe and moving around makes it worse and nothing makes it better.  She is also had some nausea and tonight she vomited after getting 2 Percocet in the emergency department.  She had a fever of 100.7 degrees at home and has felt chills.  She either saw Dr. Romeo AppleHarrison follow-up or spoke with him on the phone and was told that if she continues to have worsening pain she may need some imaging of her abdomen.  The tramadol is not helping the pain.  She denies chest pain, shortness of breath.  She is having some dysuria.  No significant amount of vaginal bleeding.  Past Medical History:  Diagnosis Date  . Anemia   . Asthma    WELL CONTROLLED-LAST USED INHALER IN 2012  . Migraines    MIGRAINES  . Sickle cell trait [facility](HCC)     Patient Active Problem List   Diagnosis Date Noted  . Chronic pelvic pain in female 12/11/2017    Past Surgical History:  Procedure Laterality Date  . CESAREAN SECTION    . DIAGNOSTIC LAPAROSCOPY  2012   Dr. Luella Cookosenow; lysis of adhesions  . LAPAROSCOPIC HYSTERECTOMY Bilateral 01/05/2018   Procedure: HYSTERECTOMY TOTAL LAPAROSCOPIC, BILATERAL SALPINGECTOMY;  Surgeon: Nadara MustardHarris, Robert P, MD;  Location: [facility] ORS;  Service: Gynecology;  Laterality: Bilateral;  . LAPAROSCOPY N/A 01/05/2018   Procedure: LAPAROSCOPY DIAGNOSTIC;  Surgeon:  Nadara MustardHarris, Robert P, MD;  Location: [facility] ORS;  Service: Gynecology;  Laterality: N/A;    Prior to Admission medications   Medication Sig Start Date End Date Taking? Authorizing Provider  acetaminophen (TYLENOL) 500 MG tablet Take 1,000 mg by mouth every 6 (six) hours as needed (for pain.).    [provider]  albuterol (PROVENTIL HFA;VENTOLIN HFA) 108 (90 Base) MCG/ACT inhaler Inhale 2 puffs into the lungs every 6 (six) hours as needed for wheezing or shortness of breath.    [provider]  HYDROcodone-acetaminophen (NORCO) 5-325 MG tablet Take 1 tablet by mouth every 4 (four) hours as needed for moderate pain. 01/16/18   Willy Eddyobinson, Patrick, MD  ibuprofen (ADVIL,MOTRIN) 600 MG tablet Take 1 tablet (600 mg total) by mouth every 6 (six) hours as needed for moderate pain. 01/07/18   Nadara MustardHarris, Robert P, MD  meloxicam (MOBIC) 15 MG tablet Take 1 tablet (15 mg total) by mouth 2 (two) times daily as needed for pain. 12/11/17   Nadara MustardHarris, Robert P, MD  oxyCODONE-acetaminophen (PERCOCET/ROXICET) 5-325 MG tablet Take 1 tablet by mouth every 3 (three) hours as needed for moderate pain. 01/05/18   Nadara MustardHarris, Robert P, MD  traMADol (ULTRAM) 50 MG tablet Take 1 tablet (50 mg total) by mouth every 6 (six) hours as needed. 01/07/18   Nadara MustardHarris, Robert P, MD    Allergies Patient has no known allergies.  Family History  Problem Relation Age of Onset  . Hypertension Maternal Grandmother   .  Hypertension Maternal Grandfather     Social History Social History   Tobacco Use  . Smoking status: Never Smoker  . Smokeless tobacco: Never Used  Substance Use Topics  . Alcohol use: No  . Drug use: No    Review of Systems Constitutional: +fever/chills Eyes: No visual changes. ENT: No sore throat. Cardiovascular: Denies chest pain. Respiratory: Denies shortness of breath. Gastrointestinal: Gradually worsening abdominal pain and nausea/vomiting as described above Genitourinary: Negative for  dysuria. Musculoskeletal: Negative for neck pain.  Negative for back pain. Integumentary: Negative for rash. Neurological: Negative for headaches, focal weakness or numbness.   ____________________________________________   PHYSICAL EXAM:  VITAL SIGNS: ED Triage Vitals  Enc Vitals Group     BP 01/15/18 2114 113/72     Pulse Rate 01/15/18 2114 (!) 104     Resp 01/15/18 2114 20     Temp 01/15/18 2114 98.7 F (37.1 C)     Temp Source 01/15/18 2114 Oral     SpO2 01/15/18 2114 96 %     Weight --      Height --      Head Circumference --      Peak Flow --      Pain Score 01/15/18 2115 8     Pain Loc --      Pain Edu? --      Excl. in GC? --     Constitutional: Alert and oriented. Well appearing and in no acute distress. Eyes: Conjunctivae are normal.  Head: Atraumatic. Nose: No congestion/rhinnorhea. Mouth/Throat: Mucous membranes are moist. Neck: No stridor.  No meningeal signs.   Cardiovascular: Normal rate, regular rhythm. Good peripheral circulation. Grossly normal heart sounds. Respiratory: Normal respiratory effort.  No retractions. Lungs CTAB. Gastrointestinal: Soft and nondistended.  Severe tenderness to palpation of the lower abdomen, worse on the right side, with no rebound or guarding.  Well-appearing surgical wounds. Musculoskeletal: No lower extremity tenderness nor edema. No gross deformities of extremities. Neurologic:  Normal speech and language. No gross focal neurologic deficits are appreciated.  Skin:  Skin is warm, dry and intact. No rash noted. Psychiatric: Mood and affect are normal. Speech and behavior are normal.  ____________________________________________   LABS (all labs ordered are listed, but only abnormal results are displayed)  Labs Reviewed  COMPREHENSIVE METABOLIC PANEL - Abnormal; Notable for the following components:      Result Value   CO2 21 (*)    Glucose, Bld 101 (*)    Calcium 8.8 (*)    ALT 9 (*)    All other components  within normal limits  CBC - Abnormal; Notable for the following components:   HCT 34.9 (*)    All other components within normal limits  LIPASE, BLOOD   ____________________________________________  EKG  None - EKG not ordered by ED physician ____________________________________________  RADIOLOGY   ED MD interpretation: No acute abnormalities in the abdomen and pelvis suggestive of operative complication or acute infection.  Official radiology report(s): Ct Abdomen Pelvis W Contrast  Result Date: 01/16/2018 CLINICAL DATA:  Acute generalized abdominal pain, nausea, vomiting. Recent hysterectomy. EXAM: CT ABDOMEN AND PELVIS WITH CONTRAST TECHNIQUE: Multidetector CT imaging of the abdomen and pelvis was performed using the standard protocol following bolus administration of intravenous contrast. CONTRAST:  ISOVUE-300 IOPAMIDOL (ISOVUE-300) INJECTION 61% COMPARISON:  None available. FINDINGS: Lower chest: Mild scattered subsegmental atelectatic changes present within the lung bases. Visualized lungs are otherwise clear. Hepatobiliary: Subcentimeter hypodensity within the right hepatic lobe noted, too small  the characterize. Liver otherwise unremarkable. Gallbladder within normal limits. No biliary dilatation. Pancreas: Pancreas within normal limits. Spleen: Spleen within normal limits. Adrenals/Urinary Tract: Adrenal glands are normal. Kidneys equal size with symmetric enhancement. No hydroureter. Partially distended bladder within normal limits. Stomach/Bowel: Stomach moderately distended with oral contrast material within the gastric lumen. Stomach otherwise unremarkable. No evidence for bowel obstruction. Appendix visualized within the right lower quadrant and is within normal limits for caliber without evidence for acute appendicitis. Hazy fat stranding throughout the pelvis favored to be related to postsurgical changes. No acute inflammatory changes seen about the bowels themselves.  Vascular/Lymphatic: Normal intravascular enhancement seen throughout the intra-abdominal aorta and its branch vessels. No adenopathy. Reproductive: Scattered ill-defined soft tissue stranding throughout the pelvis, most likely related to recent hysterectomy. Changes primarily localized around the vaginal cuff, with extension towards the bilateral adnexa. Inflammatory changes without discrete or loculated collection. Ovaries mildly prominent but symmetric in size and appearance and within normal limits for age. Gonadal veins are prominent bilaterally. Soft tissue stranding within the subcutaneous fat of the right ventral abdomen consistent with prior port placement. No complications identified. Other: No free intraperitoneal air. Small volume free fluid postoperative stranding within the pelvis. Musculoskeletal: No acute osseous abnormality. No worrisome lytic or blastic osseous lesions. IMPRESSION: 1. Scattered soft tissue stranding with small volume free fluid within the pelvis, consistent with history of recent hysterectomy. Changes felt to most likely be within normal limits for normal expected postoperative changes, however, a developing superimposed infection would be difficult to exclude, and could be considered in the correct clinical setting. No discrete or drainable fluid collections identified. No other complication. 2. No other acute intra-abdominal or pelvic process. Electronically Signed   By: Rise Mu M.D.   On: 01/16/2018 05:48    ____________________________________________   PROCEDURES  Critical Care performed: No   Procedure(s) performed:   Procedures   ____________________________________________   INITIAL IMPRESSION / ASSESSMENT AND PLAN / ED COURSE  As part of my medical decision making, I reviewed the following data within the electronic MEDICAL RECORD NUMBER Nursing notes reviewed and incorporated, Labs reviewed , Old chart reviewed and Evaluated by EM attending and  discussed with OB/GYN by phone (Dr. Jean Rosenthal).    Differential diagnosis includes, but is not limited to, normal postoperative pain, musculoskeletal strain, postoperative infection/abscess, hematoma or other fluid collection, bowel injury, bladder or ureteral injury, etc.  She is far enough out from her surgery that I would not expect this to be a "normal" postoperative fever.  She is quite tender to palpation and I will obtain a CT scan with oral and IV contrast to try to best evaluate for any possibility of infection or operative complication.  The patient may not be able to tolerate the oral contrast because she did vomit up to Percocets, but I am giving morphine 4 mg IV and Zofran 4 mg IV as well as 500 mL normal saline bolus.  The patient is in no distress as long as I am not palpating her abdomen.  Clinical Course as of Jan 16 1010  Sat Jan 16, 2018  0613 I discussed the case by phone with Dr. Jean Rosenthal.  The patient CT scan was generally reassuring with no clear evidence of any acute infectious process or other postsurgical abnormality.  Based on the current presentation he did not recommend empiric antibiotics and recommended close follow-up in clinic on Monday.  He was okay with the plan for additional pain control.   [CF]  1010 Delayed documentation: Due to emergency traffic requiring extensive critical care, I was unable to update the patient personally but I signed out the patient to my colleague who updated her and gave her follow-up information as I had previously described and discussed.  As I was caring for the of the patient and I did witness this patient ambulating out of the emergency department in no apparent distress.   [CF]    Clinical Course User Index [CF] Loleta Rose, MD    ____________________________________________  FINAL CLINICAL IMPRESSION(S) / ED DIAGNOSES  Final diagnoses:  Lower abdominal pain     MEDICATIONS GIVEN DURING THIS VISIT:  Medications    oxyCODONE-acetaminophen (PERCOCET/ROXICET) 5-325 MG per tablet 2 tablet (2 tablets Oral Given 01/16/18 0227)  sodium chloride 0.9 % bolus 500 mL (0 mLs Intravenous Stopped 01/16/18 0350)  ondansetron (ZOFRAN) injection 4 mg (4 mg Intravenous Given 01/16/18 0248)  morphine 4 MG/ML injection 4 mg (4 mg Intravenous Given 01/16/18 0248)  iopamidol (ISOVUE-300) 61 % injection 15 mL (15 mLs Oral Contrast Given 01/16/18 0400)  iopamidol (ISOVUE-300) 61 % injection 100 mL (100 mLs Intravenous Contrast Given 01/16/18 0517)     ED Discharge Orders        Ordered    HYDROcodone-acetaminophen (NORCO) 5-325 MG tablet  Every 4 hours PRN     01/16/18 0716       Note:  This document was prepared using Dragon voice recognition software and may include unintentional dictation errors.    Loleta Rose, MD 01/16/18 1011

## 2018-01-16 NOTE — ED Notes (Signed)
Patient transported to Ultrasound 

## 2018-01-16 NOTE — ED Provider Notes (Signed)
Patient received in sign-out from Dr. York CeriseForbach.  Workup and evaluation pending follow up labs and reassessment.  Patient hemodynamically stable.  At this point patient will be discharged home with prescription for Percocet.  Have discussed with the patient and available family all diagnostics and treatments performed thus far and all questions were answered to the best of my ability. The patient demonstrates understanding and agreement with plan.      Willy Eddyobinson, Jebadiah Imperato, MD 01/16/18 [phone]

## 2018-01-16 NOTE — Discharge Instructions (Signed)

## 2018-01-18 ENCOUNTER — Ambulatory Visit: Payer: Medicaid Other | Admitting: Obstetrics and Gynecology

## 2018-01-19 ENCOUNTER — Encounter: Payer: Self-pay | Admitting: Obstetrics and Gynecology

## 2018-01-19 ENCOUNTER — Ambulatory Visit (INDEPENDENT_AMBULATORY_CARE_PROVIDER_SITE_OTHER): Payer: Medicaid Other | Admitting: Obstetrics and Gynecology

## 2018-01-19 VITALS — BP 120/66 | HR 96 | Temp 99.0°F | Ht 65.0 in | Wt 196.0 lb

## 2018-01-19 DIAGNOSIS — R300 Dysuria: Secondary | ICD-10-CM | POA: Diagnosis not present

## 2018-01-19 DIAGNOSIS — R5082 Postprocedural fever: Secondary | ICD-10-CM

## 2018-01-19 DIAGNOSIS — Z4889 Encounter for other specified surgical aftercare: Secondary | ICD-10-CM

## 2018-01-19 DIAGNOSIS — Z9889 Other specified postprocedural states: Secondary | ICD-10-CM

## 2018-01-19 LAB — POCT URINALYSIS DIPSTICK
Bilirubin, UA: NEGATIVE
Glucose, UA: NEGATIVE
Ketones, UA: NEGATIVE
Nitrite, UA: NEGATIVE
PH UA: 7 (ref 5.0–8.0)
Protein, UA: POSITIVE — AB
Spec Grav, UA: 1.015 (ref 1.010–1.025)
UROBILINOGEN UA: NEGATIVE U/dL — AB

## 2018-01-19 MED ORDER — PROCHLORPERAZINE MALEATE 10 MG PO TABS: 10.0000 mg | ORAL_TABLET | Freq: Four times a day (QID) | ORAL | 0 refills | Status: DC | PRN

## 2018-01-19 MED ORDER — OXYCODONE-ACETAMINOPHEN 5-325 MG PO TABS: 1.0000 | ORAL_TABLET | ORAL | 0 refills | Status: DC | PRN

## 2018-01-19 MED ORDER — LIDOCAINE-PRILOCAINE 2.5-2.5 % EX CREA: 1.0000 "application " | TOPICAL_CREAM | CUTANEOUS | 0 refills | Status: DC | PRN

## 2018-01-19 NOTE — Progress Notes (Signed)
Postoperative Follow-up Patient presents post op from [place], BS, cystoscopy 2weeks ago for pelvic pain.  Subjective: Patient reports no improvement in her preop symptoms. Eating a regular diet with difficulty, still having nausea. Pain is not well controlled.  Medications being used: ibuprofen (OTC) and hydrocodone.  Has noted fevers and chills and was evaluated in ER with negative findings.  Denies vaginal bleeding or discharge  Objective: Blood pressure 120/66, pulse 96, temperature 99 F (37.2 C), height 5' 5"  (1.651 m), weight 196 lb (88.9 kg), last menstrual period 05/18/2016.  Gen: NAD HEENT: normocephalic, anicteric Pulmonary: no increased work of breathing Abdomen: soft, tender around right lower quadrant trocar site, no rebound, no guarding, trocar sites D/C/I Ext: no edema  Admission on 01/16/2018, Discharged on 01/16/2018  Component Date Value Ref Range Status  . Lipase 01/15/2018 23  11 - 51 U/L Final   Performed at [facility], [address] [place], [place] [address]  . Sodium 01/15/2018 136  135 - 145 mmol/L Final  . Potassium 01/15/2018 3.6  3.5 - 5.1 mmol/L Final  . Chloride 01/15/2018 108  101 - 111 mmol/L Final  . CO2 01/15/2018 21* 22 - 32 mmol/L Final  . Glucose, Bld 01/15/2018 101* 65 - 99 mg/dL Final  . BUN 01/15/2018 6  6 - 20 mg/dL Final  . Creatinine, Ser 01/15/2018 0.91  0.44 - 1.00 mg/dL Final  . Calcium 01/15/2018 8.8* 8.9 - 10.3 mg/dL Final  . Total Protein 01/15/2018 7.2  6.5 - 8.1 g/dL Final  . Albumin 01/15/2018 3.6  3.5 - 5.0 g/dL Final  . AST 01/15/2018 15  15 - 41 U/L Final  . ALT 01/15/2018 9* 14 - 54 U/L Final  . Alkaline Phosphatase 01/15/2018 66  38 - 126 U/L Final  . Total Bilirubin 01/15/2018 0.8  0.3 - 1.2 mg/dL Final  . GFR calc non Af Amer 01/15/2018 >60  >60 mL/min Final  . GFR calc Af Amer 01/15/2018 >60  >60 mL/min Final   Comment: (NOTE) The eGFR has been calculated using the CKD EPI equation. This calculation  has not been validated in all clinical situations. eGFR's persistently <60 mL/min signify possible Chronic Kidney Disease.   Georgiann Hahn gap 01/15/2018 7  5 - 15 Final   Performed at [facility], [address] [place], [place] [address]  . WBC 01/15/2018 8.2  3.6 - 11.0 K/uL Final  . RBC 01/15/2018 3.86  3.80 - 5.20 MIL/uL Final  . Hemoglobin 01/15/2018 12.6  12.0 - 16.0 g/dL Final  . HCT 01/15/2018 34.9* 35.0 - 47.0 % Final  . MCV 01/15/2018 90.6  80.0 - 100.0 fL Final  . MCH 01/15/2018 32.6  26.0 - 34.0 pg Final  . MCHC 01/15/2018 36.0  32.0 - 36.0 g/dL Final  . RDW 01/15/2018 12.4  11.5 - 14.5 % Final  . Platelets 01/15/2018 281  150 - 440 K/uL Final   Performed at [facility], [address] [place],  [address]    Assessment: 32 y.o. s/p TLH, BS cystoscopy chronic pelvic pain  Plan: Patient has done well after surgery with no apparent complications.  I have discussed the post-operative course to date, and the expected progress moving forward.  The patient understands what complications to be concerned about.  I will see the patient in routine follow up, or sooner if needed.    CT imaging 01/16/2018 reviewed and normal.  Normal WBC on CBC at that time.  No UA obtained.   UCx sent low suspicion.   Emla cream for fascial pain at site of 60m trocar   Activity plan: No restriction.   AMalachy Mood MD, FTarpon SpringsOB/GYN, [facility] 01/19/2018, 9:21 AM

## 2018-01-21 LAB — URINE CULTURE

## 2018-01-25 ENCOUNTER — Encounter: Payer: Self-pay | Admitting: Obstetrics & Gynecology

## 2018-01-25 ENCOUNTER — Ambulatory Visit (INDEPENDENT_AMBULATORY_CARE_PROVIDER_SITE_OTHER): Payer: Medicaid Other | Admitting: Obstetrics & Gynecology

## 2018-01-25 VITALS — BP 140/80 | HR 85 | Ht 65.0 in | Wt 196.0 lb

## 2018-01-25 DIAGNOSIS — Z9889 Other specified postprocedural states: Secondary | ICD-10-CM

## 2018-01-25 DIAGNOSIS — G8929 Other chronic pain: Secondary | ICD-10-CM

## 2018-01-25 DIAGNOSIS — R102 Pelvic and perineal pain: Secondary | ICD-10-CM

## 2018-01-25 NOTE — Progress Notes (Signed)
  Postoperative Follow[facility]up [facility]. Images:    Pathology: DIAGNOSIS:  A. UTERUS WITH CERVIX; HYSTERECTOMY:  - CERVIX WITHOUT PATHOLOGIC CHANGES.  - ENDOMETRIUM WITH PROGESTIN EFFECT.  - MYOMETRIUM WITHOUT PATHOLOGIC CHANGES.   BILATERAL FALLOPIAN TUBES; SALPINGECTOMY:  - NO PATHOLOGIC CHANGES.   Subjective: Patient reports continued pain since the surgery especially in the pelvis and RLQ/RUQ.  Eating a regular diet with difficulty. Pain is not well controlled.  Medications being used: narcotic analgesics including IBF, Tramasol, Percocet..  Activity: sedentary. Patient reports vaginal sx's of None  Objective: BP 140/80   Pulse 85   Ht 5\' 5"  (1.651 m)   Wt 196 lb (88.9 kg)   LMP 05/18/2016 (Approximate)   BMI 32.62 kg/m  Physical Exam  Constitutional: She is oriented to person, place, and time. She appears well-developed and well-nourished. No distress.  Genitourinary: Rectum normal and vagina normal. Pelvic exam was performed with patient supine. There is no rash, tenderness or lesion on the right labia. There is no rash, tenderness or lesion on the left labia. No erythema or bleeding in the vagina. Right adnexum does not display mass and does not display tenderness. Left adnexum does not display mass and does not display tenderness.  Genitourinary Comments: Cervix and uterus absent. Vaginal cuff healing well.  Cardiovascular: Normal rate.  Pulmonary/Chest: Effort normal.  Abdominal: Soft. She exhibits no distension. There is tenderness in the right lower quadrant and suprapubic area.  Incision healing well. Tender all over abdomen, mild distension  Musculoskeletal: Normal range of motion.  Neurological: She is alert and oriented to person, place, and time. No cranial nerve deficit.  Skin: Skin is warm and dry.  Psychiatric: She has a normal mood and affect.   Assessment: s/p :  total laparoscopic hysterectomy with  bilateral salpingectomy not tolerating diet and still has pain  Plan: Pain out of proportion to recovery timeframe. Has been seen (ER, here) but no other etiology obvious for her continued pain and difficulty eating (may be med induced) MRI of pelvis Activity plan: No heavy lifting. Pelvic rest  Letitia LibraRobert Paul Timarie Labell 01/25/2018, 8:48 AM

## 2018-01-26 ENCOUNTER — Ambulatory Visit
Admission: RE | Admit: 2018-01-26 | Discharge: 2018-01-26 | Disposition: A | Discharge status: Home / Self Care | Payer: Medicaid Other | Source: Ambulatory Visit | Attending: Obstetrics & Gynecology | Admitting: Obstetrics & Gynecology

## 2018-01-26 DIAGNOSIS — R102 Pelvic and perineal pain: Secondary | ICD-10-CM | POA: Diagnosis not present

## 2018-01-26 DIAGNOSIS — Z90710 Acquired absence of both cervix and uterus: Secondary | ICD-10-CM | POA: Diagnosis not present

## 2018-01-26 MED ORDER — GADOBENATE DIMEGLUMINE 529 MG/ML IV SOLN
20.0000 mL | Freq: Once | INTRAVENOUS | Status: AC | PRN
  Administered 2018-01-26: 18 mL via INTRAVENOUS

## 2018-01-27 ENCOUNTER — Other Ambulatory Visit: Payer: Self-pay | Admitting: Obstetrics & Gynecology

## 2018-01-27 MED ORDER — ESOMEPRAZOLE MAGNESIUM 40 MG PO CPDR: 40.0000 mg | DELAYED_RELEASE_CAPSULE | Freq: Every day | ORAL | 1 refills | Status: DC

## 2018-01-27 NOTE — Progress Notes (Signed)
MRI reviewed and d/w pt. Cont to monitor sx's of pain, nausea. Nexium added.  Zofran ok PRN. Minimize percocet, add IBF. See back in office soon. Warning signs dicussed. Annamarie MajorPaul Hedy Garro, MD, Merlinda FrederickFACOG [facility], [facility] 01/27/2018  11:24 AM

## 2018-02-22 ENCOUNTER — Telehealth: Payer: Self-pay

## 2018-02-22 ENCOUNTER — Ambulatory Visit (INDEPENDENT_AMBULATORY_CARE_PROVIDER_SITE_OTHER): Payer: Medicaid Other | Admitting: Obstetrics & Gynecology

## 2018-02-22 ENCOUNTER — Encounter: Payer: Self-pay | Admitting: Obstetrics & Gynecology

## 2018-02-22 VITALS — BP 120/80 | Ht 65.0 in | Wt 197.0 lb

## 2018-02-22 DIAGNOSIS — G8929 Other chronic pain: Secondary | ICD-10-CM

## 2018-02-22 DIAGNOSIS — R102 Pelvic and perineal pain: Secondary | ICD-10-CM

## 2018-02-22 NOTE — Progress Notes (Signed)
  Postoperative Follow-up Patient presents post op from [facility] for pelvic pain, 6 weeks ago.  Subjective: Patient reports pain has returned 2 weeks ago- a similar pain to pre-op.  The post op pain she had has shown improvement. Eating a regular diet without difficulty. Pain is not well controlled.  Medications being used: ibuprofen (OTC).  Activity: normal activities of daily living. Patient reports vaginal sx's of None  Objective: BP 120/80   Ht 5\' 5"  (1.651 m)   Wt 197 lb (89.4 kg)   LMP 05/18/2016 (Approximate)   BMI 32.78 kg/m  Physical Exam  Constitutional: She is oriented to person, place, and time. She appears well-developed and well-nourished. No distress.  Genitourinary: Rectum normal and vagina normal. Pelvic exam was performed with patient supine. There is no rash, tenderness or lesion on the right labia. There is no rash, tenderness or lesion on the left labia. No erythema or bleeding in the vagina. Right adnexum does not display mass and does not display tenderness. Left adnexum does not display mass and does not display tenderness.  Genitourinary Comments: Cervix and uterus absent. Vaginal cuff healing well.  Cardiovascular: Normal rate.  Pulmonary/Chest: Effort normal.  Abdominal: Soft. She exhibits no distension. There is no tenderness.  Incision healing well.  Musculoskeletal: Normal range of motion.  Neurological: She is alert and oriented to person, place, and time. No cranial nerve deficit.  Skin: Skin is warm and dry.  Psychiatric: She has a normal mood and affect.    Assessment: s/p :  total laparoscopic hysterectomy with bilateral salpingectomy stable  Plan: Patient has done well after surgery with no apparent complications.  I have discussed the post-operative course to date, and the expected progress moving forward.  The patient understands what complications to be concerned about.  I will see the patient in routine follow up, or sooner if needed.  Referral if  pain persists- urologic, neurologic, GI, MS, other are discussed as possibilities.  No sign of ovarian etiology; no endometriosis on pathology, or other GYN etiology suspected.   Activity plan: No restriction.  Letitia Libraobert Paul Harris 02/22/2018, 3:28 PM

## 2018-02-22 NOTE — Telephone Encounter (Signed)
Pt states she forgot to ask you if you could refill her pain medication. Please advise

## 2018-02-22 NOTE — Telephone Encounter (Signed)
Not needed at this time after surgery

## 2018-02-23 NOTE — Telephone Encounter (Signed)
Left detailed message we can not refill meds

## 2018-05-24 ENCOUNTER — Emergency Department: Payer: Medicaid Other

## 2018-05-24 ENCOUNTER — Encounter: Payer: Self-pay | Admitting: Intensive Care

## 2018-05-24 ENCOUNTER — Other Ambulatory Visit: Payer: Self-pay

## 2018-05-24 ENCOUNTER — Emergency Department
Admission: EM | Admit: 2018-05-24 | Discharge: 2018-05-24 | Disposition: A | Discharge status: Home / Self Care | Payer: Medicaid Other | Attending: Emergency Medicine | Admitting: Emergency Medicine

## 2018-05-24 DIAGNOSIS — J45909 Unspecified asthma, uncomplicated: Secondary | ICD-10-CM | POA: Insufficient documentation

## 2018-05-24 DIAGNOSIS — R2242 Localized swelling, mass and lump, left lower limb: Secondary | ICD-10-CM | POA: Diagnosis present

## 2018-05-24 DIAGNOSIS — M79672 Pain in left foot: Secondary | ICD-10-CM | POA: Insufficient documentation

## 2018-05-24 DIAGNOSIS — D573 Sickle-cell trait: Secondary | ICD-10-CM | POA: Diagnosis not present

## 2018-05-24 DIAGNOSIS — M79671 Pain in right foot: Secondary | ICD-10-CM | POA: Diagnosis not present

## 2018-05-24 LAB — CBC
HEMATOCRIT: 37.7 % (ref 36.0–46.0)
HEMOGLOBIN: 13.1 g/dL (ref 12.0–15.0)
MCH: 32.3 pg (ref 26.0–34.0)
MCHC: 34.7 g/dL (ref 30.0–36.0)
MCV: 93.1 fL (ref 80.0–100.0)
Platelets: 282 10*3/uL (ref 150–400)
RBC: 4.05 MIL/uL (ref 3.87–5.11)
RDW: 12.1 % (ref 11.5–15.5)
WBC: 5.2 10*3/uL (ref 4.0–10.5)
nRBC: 0 % (ref 0.0–0.2)

## 2018-05-24 LAB — BASIC METABOLIC PANEL
Anion gap: 8 (ref 5–15)
BUN: 9 mg/dL (ref 6–20)
CO2: 23 mmol/L (ref 22–32)
CREATININE: 0.93 mg/dL (ref 0.44–1.00)
Calcium: 9.4 mg/dL (ref 8.9–10.3)
Chloride: 109 mmol/L (ref 98–111)
GFR calc Af Amer: >60 mL/min (ref >60)
GFR calc non Af Amer: >60 mL/min (ref >60)
GLUCOSE: 102 mg/dL — AB (ref 70–99)
Potassium: 4.2 mmol/L (ref 3.5–5.1)
SODIUM: 140 mmol/L (ref 135–145)

## 2018-05-24 LAB — GLUCOSE, CAPILLARY: GLUCOSE-CAPILLARY: 87 mg/dL (ref 70–99)

## 2018-05-24 MED ORDER — PREDNISONE 10 MG PO TABS: ORAL_TABLET | ORAL | 0 refills | Status: DC

## 2018-05-24 MED ORDER — IBUPROFEN 600 MG PO TABS: 600.0000 mg | ORAL_TABLET | Freq: Four times a day (QID) | ORAL | 0 refills | Status: DC | PRN

## 2018-05-24 NOTE — ED Triage Notes (Signed)
Patient c/o burning in bilateral feet and swelling in L foot since yesterday. Denies injury/trauma

## 2018-05-24 NOTE — ED Notes (Signed)
See triage note  Presents with pain and swelling to both feet    States she developed pain last pm  Pain is worse to left than right

## 2018-05-24 NOTE — ED Provider Notes (Signed)
[facility] Emergency Department Provider Note  ____________________________________________  Time seen: Approximately 11:31 AM  I have reviewed the triage vital signs and the nursing notes.   HISTORY  Chief Complaint Foot Swelling (Left foot)    HPI Danielle Pollard is a 32 y.o. female presents emergency department for evaluation of bilateral foot burning and left foot pain and swelling since this morning.  Symptoms have been improving.  Burning is primarily on the insides of her feet.  She has burning in both feet but only pain and swelling to the left foot.  Patient went to the dentist this morning and noticed her left foot was swollen.  This is never happened before.  No trauma.  Patient is on her feet regularly for work.  She wears crocs to work. No history of gout. No fever, back pain, weakness.  Past Medical History:  Diagnosis Date  . Anemia   . Asthma    WELL CONTROLLED-LAST USED INHALER IN 2012  . Migraines    MIGRAINES  . Sickle cell trait [facility])     Patient Active Problem List   Diagnosis Date Noted  . Chronic pelvic pain in female 12/11/2017    Past Surgical History:  Procedure Laterality Date  . CESAREAN SECTION    . DIAGNOSTIC LAPAROSCOPY  2012   Dr. Luella Cook; lysis of adhesions  . LAPAROSCOPIC HYSTERECTOMY Bilateral 01/05/2018   Procedure: HYSTERECTOMY TOTAL LAPAROSCOPIC, BILATERAL SALPINGECTOMY;  Surgeon: Nadara Mustard, MD;  Location: [facility] ORS;  Service: Gynecology;  Laterality: Bilateral;  . LAPAROSCOPY N/A 01/05/2018   Procedure: LAPAROSCOPY DIAGNOSTIC;  Surgeon: Nadara Mustard, MD;  Location: [facility] ORS;  Service: Gynecology;  Laterality: N/A;    Prior to Admission medications   Medication Sig Start Date End Date Taking? Authorizing Provider  acetaminophen (TYLENOL) 500 MG tablet Take 1,000 mg by mouth every 6 (six) hours as needed (for pain.).    [provider]  albuterol (PROVENTIL HFA;VENTOLIN HFA) 108 (90 Base)  MCG/ACT inhaler Inhale 2 puffs into the lungs every 6 (six) hours as needed for wheezing or shortness of breath.    [provider]  esomeprazole (NEXIUM) 40 MG capsule Take 1 capsule (40 mg total) by mouth daily. Patient not taking: Reported on 02/22/2018 01/27/18 01/27/19  Nadara Mustard, MD  ibuprofen (ADVIL,MOTRIN) 600 MG tablet Take 1 tablet (600 mg total) by mouth every 6 (six) hours as needed. 05/24/18   Enid Derry, PA-C  lidocaine-prilocaine (EMLA) cream Apply 1 application topically as needed. Patient not taking: Reported on 02/22/2018 01/19/18   Vena Austria, MD  meloxicam (MOBIC) 15 MG tablet Take 1 tablet (15 mg total) by mouth 2 (two) times daily as needed for pain. Patient not taking: Reported on 02/22/2018 12/11/17   Nadara Mustard, MD  oxyCODONE-acetaminophen (PERCOCET/ROXICET) 5-325 MG tablet Take 1 tablet by mouth every 4 (four) hours as needed for moderate pain. Patient not taking: Reported on 01/25/2018 01/19/18   Vena Austria, MD  predniSONE (DELTASONE) 10 MG tablet Take 6 tablets on day 1, take 5 tablets on day 2, take 4 tablets on day 3, take 3 tablets on day 4, take 2 tablets on day 5, take 1 tablet on day 6 05/24/18   Enid Derry, PA-C  prochlorperazine (COMPAZINE) 10 MG tablet Take 1 tablet (10 mg total) by mouth every 6 (six) hours as needed for nausea or vomiting. Patient not taking: Reported on 02/22/2018 01/19/18   Vena Austria, MD  traMADol (ULTRAM) 50 MG tablet  Take 1 tablet (50 mg total) by mouth every 6 (six) hours as needed. Patient not taking: Reported on 02/22/2018 01/07/18   Nadara Mustard, MD    Allergies Patient has no known allergies.  Family History  Problem Relation Age of Onset  . Hypertension Maternal Grandmother   . Hypertension Maternal Grandfather     Social History Social History   Tobacco Use  . Smoking status: Never Smoker  . Smokeless tobacco: Never Used  Substance Use Topics  . Alcohol use: No  . Drug use: No      Review of Systems  Constitutional: No fever/chills Respiratory: No SOB. Gastrointestinal: No abdominal pain.  No nausea, no vomiting.  Musculoskeletal: Positive for foot pain.  Skin: Negative for rash, abrasions, lacerations, ecchymosis. Neurological: Negative for headaches. Negative for numbness. Positive for burning.    ____________________________________________   PHYSICAL EXAM:  VITAL SIGNS: ED Triage Vitals  Enc Vitals Group     BP 05/24/18 1106 125/76     Pulse Rate 05/24/18 1106 86     Resp 05/24/18 1106 14     Temp 05/24/18 1106 98.2 F (36.8 C)     Temp Source 05/24/18 1106 Oral     SpO2 05/24/18 1106 98 %     Weight 05/24/18 1107 202 lb (91.6 kg)     Height 05/24/18 1107 5\' 5"  (1.651 m)     Head Circumference --      Peak Flow --      Pain Score 05/24/18 1107 8     Pain Loc --      Pain Edu? --      Excl. in GC? --      Constitutional: Alert and oriented. Well appearing and in no acute distress. Eyes: Conjunctivae are normal. PERRL. EOMI. Head: Atraumatic. ENT:      Ears:      Nose: No congestion/rhinnorhea.      Mouth/Throat: Mucous membranes are moist.  Neck: No stridor.   Cardiovascular: Normal rate, regular rhythm.  Good peripheral circulation. Respiratory: Normal respiratory effort without tachypnea or retractions. Lungs CTAB. Good air entry to the bases with no decreased or absent breath sounds. Musculoskeletal: Full range of motion to all extremities. No gross deformities appreciated.  No tenderness to palpation of her lumbar spine or lumbar paraspinal muscles.  Normal gait.  Tenderness to palpation over ball of left foot.  Patient points to medial foot as area of burning.  No visible swelling.  No erythema.  No pain over great toe.  Full range of motion of toes. Neurologic:  Normal speech and language. No gross focal neurologic deficits are appreciated.  Skin:  Skin is warm, dry and intact. No rash noted. Psychiatric: Mood and affect are  normal. Speech and behavior are normal. Patient exhibits appropriate insight and judgement.   ____________________________________________   LABS (all labs ordered are listed, but only abnormal results are displayed)  Labs Reviewed  BASIC METABOLIC PANEL - Abnormal; Notable for the following components:      Result Value   Glucose, Bld 102 (*)    All other components within normal limits  GLUCOSE, CAPILLARY  CBC  CBG MONITORING, ED   ____________________________________________  EKG   ____________________________________________  RADIOLOGY Lexine Baton, personally viewed and evaluated these images (plain radiographs) as part of my medical decision making, as well as reviewing the written report by the radiologist.  Dg Foot Complete Left  Result Date: 05/24/2018 CLINICAL DATA:  Left foot pain for 2 days,  no known injury, initial encounter EXAM: LEFT FOOT - COMPLETE 3+ VIEW COMPARISON:  None. FINDINGS: There is no evidence of fracture or dislocation. There is no evidence of arthropathy or other focal bone abnormality. Soft tissues are unremarkable. IMPRESSION: No acute abnormality noted. Electronically Signed   By: Alcide Clever M.D.   On: 05/24/2018 12:49    ____________________________________________    PROCEDURES  Procedure(s) performed:    Procedures    Medications - No data to display   ____________________________________________   INITIAL IMPRESSION / ASSESSMENT AND PLAN / ED COURSE  Pertinent labs & imaging results that were available during my care of the patient were reviewed by me and considered in my medical decision making (see chart for details).  Review of the [place] CSRS was performed in accordance of the [facility] prior to dispensing any controlled drugs.     Patient presents emergency department for evaluation of bilateral foot burning and left foot pain since morning.  Vital signs and exam are reassuring.  Blood work is unremarkable.  Left foot  x-ray is negative for acute abnormalities.  Symptoms may be radicular.  No back pain.  No signs of gout or infection.  Patient will be discharged home with prescriptions for prednisone, ibuprofen. Patient is to follow up with primary care as directed. Patient is given ED precautions to return to the ED for any worsening or new symptoms.     ____________________________________________  FINAL CLINICAL IMPRESSION(S) / ED DIAGNOSES  Final diagnoses:  Pain in both feet      NEW MEDICATIONS STARTED DURING THIS VISIT:  ED Discharge Orders         Ordered    ibuprofen (ADVIL,MOTRIN) 600 MG tablet  Every 6 hours PRN     05/24/18 1334    predniSONE (DELTASONE) 10 MG tablet     05/24/18 1334              This chart was dictated using voice recognition software/Dragon. Despite best efforts to proofread, errors can occur which can change the meaning. Any change was purely unintentional.    Enid Derry, PA-C 05/24/18 1524    Emily Filbert, MD 05/25/18 1434

## 2018-06-14 ENCOUNTER — Encounter

## 2018-06-15 ENCOUNTER — Ambulatory Visit: Payer: Medicaid Other | Admitting: Podiatry

## 2018-06-15 ENCOUNTER — Encounter: Payer: Self-pay | Admitting: Podiatry

## 2018-06-15 DIAGNOSIS — G5761 Lesion of plantar nerve, right lower limb: Secondary | ICD-10-CM

## 2018-06-15 DIAGNOSIS — G5790 Unspecified mononeuropathy of unspecified lower limb: Secondary | ICD-10-CM

## 2018-06-15 DIAGNOSIS — M2580 Other specified joint disorders, unspecified joint: Secondary | ICD-10-CM

## 2018-06-15 MED ORDER — MELOXICAM 15 MG PO TABS: 15.0000 mg | ORAL_TABLET | Freq: Every day | ORAL | 1 refills | Status: DC

## 2018-06-15 MED ORDER — GABAPENTIN 100 MG PO CAPS: 100.0000 mg | ORAL_CAPSULE | Freq: Every day | ORAL | 0 refills | Status: DC

## 2018-06-17 NOTE — Progress Notes (Signed)
   HPI: 32 year old female presenting today as a new patient with a chief complaint of intermittent burning of the plantar aspects of bilateral heels that began about three weeks ago. She reports associated swelling, redness and numbness. She states the left foot is worse than the right. Standing and sitting increases the pain. She has been taking Ibuprofen and Tylenol with some temporary relief. She was seen in the ED on 05/24/18 for her symptoms and was prescribed a Prednisone and Motrin. Patient is here for further evaluation and treatment.   Past Medical History:  Diagnosis Date  . Anemia   . Asthma    WELL CONTROLLED-LAST USED INHALER IN 2012  . Migraines    MIGRAINES  . Sickle cell trait [facility](HCC)      Physical Exam: General: The patient is alert and oriented x3 in no acute distress.  Dermatology: Skin is warm, dry and supple bilateral lower extremities. Negative for open lesions or macerations.  Vascular: Palpable pedal pulses bilaterally. No edema or erythema noted. Capillary refill within normal limits.  Neurological: Epicritic and protective threshold grossly intact bilaterally.   Musculoskeletal Exam: Pain with palpation to the left sesamoid apparatus. Range of motion within normal limits to all pedal and ankle joints bilateral. Muscle strength 5/5 in all groups bilateral.   Assessment: 1. Neuritis bilateral heels 2. Sesamoiditis left   Plan of Care:  1. Patient evaluated. X-Rays from Epic reviewed.  2. Injection of 0.5 mLs of Celestone Soluspan injected into the sesamoidal apparatus. 3. Prescription for Gabapentin 100 mg QHS PRN for neuritis provided to patient.  4. Prescription for Meloxicam QD provided to patient.  5. Post op shoe dispensed.  6. Return to clinic in 4 weeks.       Felecia ShellingBrent M. Evans, DPM [facility]  Dr. Felecia ShellingBrent M. Evans, DPM    [address]                                        [place], [place] [address]                Office [phone]  [phone]  Fax [phone]) [phone]

## 2018-06-22 ENCOUNTER — Telehealth: Payer: Self-pay | Admitting: Podiatry

## 2018-06-22 NOTE — Telephone Encounter (Signed)
Pt called, Dr. Logan BoresEvans prescribed pain medication for feet but pt is still experiencing pain and wants to know if there if something else that can be prescribed. Please give pt a call.

## 2018-07-13 ENCOUNTER — Encounter: Payer: Self-pay | Admitting: Podiatry

## 2018-07-13 ENCOUNTER — Ambulatory Visit: Payer: Medicaid Other | Admitting: Podiatry

## 2018-07-13 DIAGNOSIS — M216X1 Other acquired deformities of right foot: Secondary | ICD-10-CM | POA: Diagnosis not present

## 2018-07-13 DIAGNOSIS — M2580 Other specified joint disorders, unspecified joint: Secondary | ICD-10-CM

## 2018-07-13 DIAGNOSIS — G5790 Unspecified mononeuropathy of unspecified lower limb: Secondary | ICD-10-CM

## 2018-07-13 NOTE — Progress Notes (Signed)
   HPI: 32 year old female presenting today for follow up evaluation of neuritis of the bilateral heels and left sesamoiditis. She states the pain has not changed much since her last appointment. She states her heels are numbs and she is still experiencing tenderness of the bilateral great toes. She has been taking Gabapentin and Meloxicam as well as wearing the post op shoe as directed. Patient is here for further evaluation and treatment.   Past Medical History:  Diagnosis Date  . Anemia   . Asthma    WELL CONTROLLED-LAST USED INHALER IN 2012  . Migraines    MIGRAINES  . Sickle cell trait [facility](HCC)      Physical Exam: General: The patient is alert and oriented x3 in no acute distress.  Dermatology: Skin is warm, dry and supple bilateral lower extremities. Negative for open lesions or macerations.  Vascular: Palpable pedal pulses bilaterally. No edema or erythema noted. Capillary refill within normal limits.  Neurological: Epicritic and protective threshold grossly intact bilaterally.   Musculoskeletal Exam: Pain with palpation to the left sesamoid apparatus. Range of motion within normal limits to all pedal and ankle joints bilateral. Muscle strength 5/5 in all groups bilateral.   Assessment: 1. Neuritis bilateral heels - resolved 2. Sesamoiditis left - improved   Plan of Care:  1. Patient evaluated.  2. Declined injection today.  3. Discontinue taking Gabapentin and using post op shoe.  4. Recommended good sneakers.  5. Continue taking Meloxicam as needed.  6. Return to clinic as needed.       Felecia ShellingBrent M. Caylyn Tedeschi, DPM [facility]  Dr. Felecia ShellingBrent M. Deshan Hemmelgarn, DPM    [address]                                        [place], [place] [address]                Office [phone]  Fax [phone]

## 2018-08-18 ENCOUNTER — Encounter: Payer: Self-pay | Admitting: Maternal Newborn

## 2018-08-18 ENCOUNTER — Ambulatory Visit (INDEPENDENT_AMBULATORY_CARE_PROVIDER_SITE_OTHER): Payer: Medicaid Other | Admitting: Maternal Newborn

## 2018-08-18 ENCOUNTER — Other Ambulatory Visit: Payer: Self-pay

## 2018-08-18 ENCOUNTER — Other Ambulatory Visit (HOSPITAL_COMMUNITY)
Admission: RE | Admit: 2018-08-18 | Discharge: 2018-08-18 | Disposition: A | Discharge status: Home / Self Care | Payer: Medicaid Other | Source: Ambulatory Visit | Attending: Obstetrics and Gynecology | Admitting: Obstetrics and Gynecology

## 2018-08-18 VITALS — BP 120/74 | HR 84 | Ht 65.0 in | Wt 210.0 lb

## 2018-08-18 DIAGNOSIS — Z113 Encounter for screening for infections with a predominantly sexual mode of transmission: Secondary | ICD-10-CM

## 2018-08-18 DIAGNOSIS — Z130 Encounter for screening for diseases of the blood and blood-forming organs and certain disorders involving the immune mechanism: Secondary | ICD-10-CM

## 2018-08-18 DIAGNOSIS — Z0000 Encounter for general adult medical examination without abnormal findings: Secondary | ICD-10-CM

## 2018-08-18 DIAGNOSIS — Z1272 Encounter for screening for malignant neoplasm of vagina: Secondary | ICD-10-CM | POA: Diagnosis present

## 2018-08-18 DIAGNOSIS — N6320 Unspecified lump in the left breast, unspecified quadrant: Secondary | ICD-10-CM

## 2018-08-18 DIAGNOSIS — Z01419 Encounter for gynecological examination (general) (routine) without abnormal findings: Secondary | ICD-10-CM

## 2018-08-18 DIAGNOSIS — G43909 Migraine, unspecified, not intractable, without status migrainosus: Secondary | ICD-10-CM

## 2018-08-18 NOTE — Progress Notes (Signed)
Gynecology Annual Exam  [facility]  Chief Complaint:  Chief Complaint  Patient presents with  . Gynecologic Exam    Lump in left breast. No pelvic done at PCP.    History of Present Illness: Patient is a 33 y.o. G1P1001 presenting for an annual exam.   LMP: Menses absent due to hysterectomy  The patient is sexually active. She currently uses status post hysterectomy for contraception. She denies dyspareunia.  The patient does perform self breast exams.  There is no notable family history of breast or ovarian cancer in her family.  The patient wears seatbelts: yes.   The patient has regular exercise: yes.    The patient denies current symptoms of depression.    Review of Systems  Constitutional: Negative.   HENT: Negative.   Eyes: Negative.   Respiratory: Positive for shortness of breath. Negative for cough and wheezing.   Cardiovascular:       Pinching sensation in chest  Gastrointestinal: Negative for abdominal pain.  Genitourinary: Negative.   Musculoskeletal: Negative.   Skin: Negative.   Neurological: Positive for seizures and headaches.       Migraines  Endo/Heme/Allergies: Negative.   Psychiatric/Behavioral: Negative for depression. The patient is not nervous/anxious.   Breasts: positive for new or changing breast lumps and tenderness negative for nipple changes or nipple discharge  Review of systems otherwise negative unless noted in HPI.  Past Medical History:  Past Medical History:  Diagnosis Date  . Anemia   . Asthma    WELL CONTROLLED-LAST USED INHALER IN 2012  . Migraines    MIGRAINES  . Sickle cell trait [facility](HCC)     Past Surgical History:  Past Surgical History:  Procedure Laterality Date  . CESAREAN SECTION    . DIAGNOSTIC LAPAROSCOPY  2012   Dr. Luella Cookosenow; lysis of adhesions  . LAPAROSCOPIC HYSTERECTOMY Bilateral 01/05/2018   Procedure: HYSTERECTOMY TOTAL LAPAROSCOPIC, BILATERAL SALPINGECTOMY;  Surgeon: Nadara MustardHarris, Robert  P, MD;  Location: [facility] ORS;  Service: Gynecology;  Laterality: Bilateral;  . LAPAROSCOPY N/A 01/05/2018   Procedure: LAPAROSCOPY DIAGNOSTIC;  Surgeon: Nadara MustardHarris, Robert P, MD;  Location: [facility] ORS;  Service: Gynecology;  Laterality: N/A;    Gynecologic History:  Patient's last menstrual period was 05/18/2016 (approximate). Contraception: status post hysterectomy Last Pap: 09/25/2017.  Results were: NILM   Obstetric History: G1P1001  Family History:  Family History  Problem Relation Age of Onset  . Hypertension Maternal Grandmother   . Hypertension Maternal Grandfather     Social History:  Social History   Socioeconomic History  . Marital status: Single    Spouse name: Not on file  . Number of children: Not on file  . Years of education: Not on file  . Highest education level: Not on file  Occupational History  . Not on file  Social Needs  . Financial resource strain: Not on file  . Food insecurity:    Worry: Not on file    Inability: Not on file  . Transportation needs:    Medical: Not on file    Non-medical: Not on file  Tobacco Use  . Smoking status: Never Smoker  . Smokeless tobacco: Never Used  Substance and Sexual Activity  . Alcohol use: No  . Drug use: No  . Sexual activity: Yes    Birth control/protection: Surgical    Comment: Hysterectomy  Lifestyle  . Physical activity:    Days per week: 0 days    Minutes per session: Not  on file  . Stress: Not on file  Relationships  . Social connections:    Talks on phone: Not on file    Gets together: Not on file    Attends religious service: Not on file    Active member of club or organization: Not on file    Attends meetings of clubs or organizations: Not on file    Relationship status: Not on file  . Intimate partner violence:    Fear of current or ex partner: Not on file    Emotionally abused: Not on file    Physically abused: Not on file    Forced sexual activity: Not on file  Other Topics Concern  . Not on  file  Social History Narrative  . Not on file    Allergies:  No Known Allergies  Medications: Prior to Admission medications   Medication Sig Start Date End Date Taking? Authorizing Provider  acetaminophen (TYLENOL) 500 MG tablet Take 1,000 mg by mouth every 6 (six) hours as needed (for pain.).    [provider]  albuterol (PROVENTIL HFA;VENTOLIN HFA) 108 (90 Base) MCG/ACT inhaler Inhale 2 puffs into the lungs every 6 (six) hours as needed for wheezing or shortness of breath.    [provider]  gabapentin (NEURONTIN) 100 MG capsule Take 1 capsule (100 mg total) by mouth at bedtime. 06/15/18   Felecia Shelling, DPM  ibuprofen (ADVIL,MOTRIN) 600 MG tablet Take 1 tablet (600 mg total) by mouth every 6 (six) hours as needed. 05/24/18   Enid Derry, PA-C  meloxicam (MOBIC) 15 MG tablet Take 1 tablet (15 mg total) by mouth daily. 06/15/18   Felecia Shelling, DPM    Physical Exam Vitals: Blood pressure 120/74, pulse 84, height 5\' 5"  (1.651 m), weight 210 lb (95.3 kg), last menstrual period 05/18/2016.  General: NAD HEENT: normocephalic, anicteric Thyroid: no enlargement, no palpable nodules Pulmonary: No increased work of breathing, CTAB Cardiovascular: RRR, distal pulses 2+ Breasts: right breast normal, no skin or nipple changes or axillary nodes, left breast tender to palpation from seven to nine o'clock with small palpable lump at approximately eight o' clock. No skin or nipple changes or axillary nodes. Abdomen: Soft, non-tender, non-distended.  Umbilicus without lesions.  No hepatomegaly, splenomegaly or masses palpable. No evidence of hernia  Genitourinary:  External: Normal external female genitalia.  Normal urethral meatus, normal  Bartholin's and Skene's glands.    Vagina: Normal vaginal mucosa, no evidence of prolapse.    Cervix: Surgically absent  Uterus: Surgically absent  Adnexa: ovaries non-enlarged, no adnexal masses  Rectal: deferred  Lymphatic: no  evidence of inguinal lymphadenopathy Extremities: no edema, erythema, or tenderness Neurologic: Grossly intact Psychiatric: mood appropriate, affect full  Assessment: 32 y.o. G1P1001 here for an annual exam, left breast lump and tenderness.  Plan: Problem List Items Addressed This Visit      Cardiovascular and Mediastinum   Migraine   Relevant Orders   Ambulatory referral to Neurology    Other Visit Diagnoses    Women's annual routine gynecological examination    -  Primary   Screening for deficiency anemia       Relevant Orders   CBC (Completed)   Pap smear of vagina following gynecologic surgery       Relevant Orders   Cytology - PAP   Screen for STD (sexually transmitted disease)       Relevant Orders   Cytology - PAP   Left breast lump  Relevant Orders   US BREAST LTD UNI LEFT INC AXILLA   US BREAST LTD UNI RIGHT INC AXILLA   MM DIAG BREAST TOMO BILATERAL      1) STI screening was offered and accepted.  2) ASCCP guidelines and rationale discussed.  Patient opts for yearly screening interval.  3) Contraception - She has had a hysterectomy.  4) Routine healthcare maintenance including cholesterol, diabetes screening: managed by PCP, will screen for anemia today due to symptoms. Discussed her pain and symptoms that would call for emergency care.  5) Ultrasound/mammography to explore tenderness and palpable area of concern in her left breast.  6) Migraine headaches: has been on medications in the past, nothing much helped except for DepoProvera. Would be willing to see neurologist again to find out if there is a medication that could help. Refer to neurology.  7) Follow up 1 year for an annual exam.  Marcelyn Bruins, CNM 08/18/2018

## 2018-08-19 ENCOUNTER — Ambulatory Visit: Payer: Medicaid Other | Admitting: Obstetrics and Gynecology

## 2018-08-19 LAB — CBC
HEMATOCRIT: 39.9 % (ref 34.0–46.6)
HEMOGLOBIN: 13.4 g/dL (ref 11.1–15.9)
MCH: 32 pg (ref 26.6–33.0)
MCHC: 33.6 g/dL (ref 31.5–35.7)
MCV: 95 fL (ref 79–97)
Platelets: 281 10*3/uL (ref 150–450)
RBC: 4.19 x10E6/uL (ref 3.77–5.28)
RDW: 11.5 % — ABNORMAL LOW (ref 11.7–15.4)
WBC: 6.1 10*3/uL (ref 3.4–10.8)

## 2018-08-22 ENCOUNTER — Encounter: Payer: Self-pay | Admitting: Maternal Newborn

## 2018-08-23 ENCOUNTER — Encounter: Payer: Self-pay | Admitting: Neurology

## 2018-08-23 LAB — CYTOLOGY - PAP
Chlamydia: NEGATIVE
DIAGNOSIS: NEGATIVE
HPV (WINDOPATH): NOT DETECTED
Neisseria Gonorrhea: NEGATIVE
Trichomonas: NEGATIVE

## 2018-08-24 ENCOUNTER — Ambulatory Visit
Admission: RE | Admit: 2018-08-24 | Discharge: 2018-08-24 | Disposition: A | Discharge status: Home / Self Care | Payer: Medicaid Other | Source: Ambulatory Visit | Attending: Maternal Newborn | Admitting: Maternal Newborn

## 2018-08-24 DIAGNOSIS — N6320 Unspecified lump in the left breast, unspecified quadrant: Secondary | ICD-10-CM

## 2018-08-25 ENCOUNTER — Other Ambulatory Visit: Payer: Self-pay | Admitting: Maternal Newborn

## 2018-08-25 DIAGNOSIS — N6489 Other specified disorders of breast: Secondary | ICD-10-CM

## 2018-10-28 ENCOUNTER — Ambulatory Visit: Payer: Medicaid Other | Admitting: Neurology

## 2018-11-11 ENCOUNTER — Encounter: Payer: Self-pay | Admitting: General Surgery

## 2018-11-11 NOTE — Consult Note (Signed)
Virtual Visit via Video Note The purpose of this virtual visit is to provide medical care while limiting exposure to the novel coronavirus.    Consent was obtained for video visit:  Yes Answered questions that patient had about telehealth interaction:  Yes I discussed the limitations, risks, security and privacy concerns of performing an evaluation and management service by telemedicine. I also discussed with the patient that there may be a patient responsible charge related to this service. The patient expressed understanding and agreed to proceed.  Pt location: Home Physician Location: office Name of referring provider:  Oswaldo Conroy, CNM I connected with Danielle Pollard at patients initiation/request on 11/12/2018 at  9:00 AM EDT by video enabled telemedicine application and verified that I am speaking with the correct person using two identifiers. Pt MRN:  [id number] Pt DOB:  04-Dec-1985 Video Participants:  Danielle Pollard   History of Present Illness:  Danielle Pollard is a 33 year old woman with migraines, asthma and sickle cell trait who presents for migraines.  History supplemented by prior neurologist and referring provider notes.  Onset:  Middle school Location:  Unilateral (either side) or bifrontal Quality:  Pressure, squeezing, throbbing, pounding Intensity:  10/10.  She denies new headache, thunderclap headache Aura:  no Premonitory Phase:  no Postdrome:  Fatigue or linger dull headache for 1 to 2 days Associated symptoms:  Photophobia, phonophobia, nausea, vomiting, blurred vision, dizzy when severe.  She denies associated unilateral numbness or weakness. Duration:  2-3 days up to a week Frequency:  Frequent until hysterectomy last year and since then once a month (from 2-3 days up to a week) Frequency of abortive medication: usually 3 days a month Triggers:  Unknown Relieving factors:  Sleep in dark and quiet room, laying on a cold floor Activity:   aggravates  Headaches have been worked up with 2 head CTs.  CT Head from 12/17/11 reportedly was normal.  CT head from 06/07/15 was personally reviewed and was also unremarkable.   Current NSAIDS:  ibuprofen Current analgesics:  acetaminophen Current triptans:  none Current ergotamine:  none Current anti-emetic:  none Current muscle relaxants:  none Current anti-anxiolytic:  none Current sleep aide:  none Current Antihypertensive medications:  none Current Antidepressant medications:  none Current Anticonvulsant medications:  none Current anti-CGRP:  none Current Vitamins/Herbal/Supplements:  none Current Antihistamines/Decongestants:  none Other therapy:  none Hormone/birth control:  none  Past NSAIDS:  naproxen Past analgesics:  Excedrin Past abortive triptans:  Sumatriptan  Past abortive ergotamine:  none Past muscle relaxants:  none Past anti-emetic:  none Past antihypertensive medications:  none Past antidepressant medications:  nortriptyline Past anticonvulsant medications:  Topiramate, gabapentin (for nerve pain) Past anti-CGRP:  none Past vitamins/Herbal/Supplements:  none Past antihistamines/decongestants:  none Other past therapies: Depo-Provera  Caffeine:  No coffee.  No recent soda. Diet:  Hydrates.  Does not skip meals. Exercise:  routine Depression:  no; Anxiety:  no Other pain:  no Sleep hygiene:  Good. Family history of headache:  Maternal aunt  Labs: 08/18/18:  CBC with WBC 6.1, HGB 4.19, HCT 39.9, PLT 281 05/24/18:  BMP with Na 140, K 4.2, Cl 103, CO2 23, glucose 102, BUN 9, Cr 0.93.  Past Medical History: Past Medical History:  Diagnosis Date  . Anemia   . Asthma    WELL CONTROLLED-LAST USED INHALER IN 2012  . Migraines    MIGRAINES  . Sickle cell trait (HCC)     Medications: Outpatient Encounter Medications  as of 11/12/2018  Medication Sig  . acetaminophen (TYLENOL) 500 MG tablet Take 1,000 mg by mouth every 6 (six) hours as needed  (for pain.).  Marland Kitchen. albuterol (PROVENTIL HFA;VENTOLIN HFA) 108 (90 Base) MCG/ACT inhaler Inhale 2 puffs into the lungs every 6 (six) hours as needed for wheezing or shortness of breath.  . gabapentin (NEURONTIN) 100 MG capsule Take 1 capsule (100 mg total) by mouth at bedtime.  Marland Kitchen. ibuprofen (ADVIL,MOTRIN) 600 MG tablet Take 1 tablet (600 mg total) by mouth every 6 (six) hours as needed.  . meloxicam (MOBIC) 15 MG tablet Take 1 tablet (15 mg total) by mouth daily.   No facility-administered encounter medications on file as of 11/12/2018.     Allergies: No Known Allergies  Family History: Family History  Problem Relation Age of Onset  . Hypertension Maternal Grandmother   . Hypertension Maternal Grandfather   . Breast cancer Neg Hx     Social History: Social History   Socioeconomic History  . Marital status: Single    Spouse name: Not on file  . Number of children: Not on file  . Years of education: Not on file  . Highest education level: Not on file  Occupational History  . Not on file  Social Needs  . Financial resource strain: Not on file  . Food insecurity:    Worry: Not on file    Inability: Not on file  . Transportation needs:    Medical: Not on file    Non-medical: Not on file  Tobacco Use  . Smoking status: Never Smoker  . Smokeless tobacco: Never Used  Substance and Sexual Activity  . Alcohol use: No  . Drug use: No  . Sexual activity: Yes    Birth control/protection: Surgical    Comment: Hysterectomy  Lifestyle  . Physical activity:    Days per week: 0 days    Minutes per session: Not on file  . Stress: Not on file  Relationships  . Social connections:    Talks on phone: Not on file    Gets together: Not on file    Attends religious service: Not on file    Active member of club or organization: Not on file    Attends meetings of clubs or organizations: Not on file    Relationship status: Not on file  . Intimate partner violence:    Fear of current or ex  partner: Not on file    Emotionally abused: Not on file    Physically abused: Not on file    Forced sexual activity: Not on file  Other Topics Concern  . Not on file  Social History Narrative  . Not on file    Observations/Objective:   There were no vitals filed for this visit. No acute distress.  Alert and oriented.  Speech fluent and not dysarthric.  Language intact.  Eyes orthophoric and move in all directions.  Face symmetric.   Assessment and Plan:   Migraine without aura, without status migrainosus, intractable  1.  For preventative management, start Emgality 2.  For abortive therapy, rizatriptan 10mg .  Zofran 4mg  for nausea. 3.  Limit use of pain relievers to no more than 2 days out of week to prevent risk of rebound or medication-overuse headache. 4.  Keep headache diary 5.  Exercise, hydration, caffeine cessation, sleep hygiene, monitor for and avoid triggers 6.  Consider:  magnesium citrate 400mg  daily, riboflavin 400mg  daily, and coenzyme Q10 100mg  three times daily 7.  Follow  up in 4 months.   Follow Up Instructions:    -I discussed the assessment and treatment plan with the patient. The patient was provided an opportunity to ask questions and all were answered. The patient agreed with the plan and demonstrated an understanding of the instructions.   The patient was advised to call back or seek an in-person evaluation if the symptoms worsen or if the condition fails to improve as anticipated.  Cira Servant, DO

## 2018-11-12 ENCOUNTER — Encounter: Payer: Self-pay | Admitting: Neurology

## 2018-11-12 ENCOUNTER — Telehealth (INDEPENDENT_AMBULATORY_CARE_PROVIDER_SITE_OTHER): Payer: Medicaid Other | Admitting: Neurology

## 2018-11-12 ENCOUNTER — Other Ambulatory Visit: Payer: Self-pay

## 2018-11-12 ENCOUNTER — Other Ambulatory Visit: Payer: Self-pay | Admitting: General Surgery

## 2018-11-12 DIAGNOSIS — G43019 Migraine without aura, intractable, without status migrainosus: Secondary | ICD-10-CM | POA: Diagnosis not present

## 2018-11-12 MED ORDER — ONDANSETRON HCL 4 MG PO TABS: 4.0000 mg | ORAL_TABLET | Freq: Three times a day (TID) | ORAL | 3 refills | Status: DC | PRN

## 2018-11-12 MED ORDER — RIZATRIPTAN BENZOATE 10 MG PO TABS: ORAL_TABLET | ORAL | 3 refills | Status: DC

## 2018-11-12 NOTE — Progress Notes (Signed)
See consult note

## 2018-11-12 NOTE — Patient Instructions (Signed)
1.  For preventative management, we will start Emgality monthly injection 2.  For abortive therapy, take rizatriptan 10mg  at earliest onset of migraine.  May repeat dose once after 2 hours if needed.  Maximum 2 tablets in 24 hours. 3. Ondansetron 4mg  for nausea 4.  Limit use of pain relievers to no more than 2 days out of week to prevent risk of rebound or medication-overuse headache. 5.  Keep headache diary 6.  Exercise, hydration, caffeine cessation, sleep hygiene, monitor for and avoid triggers 7.  Consider:  magnesium citrate 400mg  daily, riboflavin 400mg  daily, and coenzyme Q10 100mg  three times daily 8.  Be aware of common food triggers such as processed sweets, processed foods with nitrites (such as deli meat, hot dogs, sausages), foods with MSG, alcohol (such as wine), chocolate, certain cheeses, certain fruits (dried fruits, bananas, pineapple), vinegar, diet soda. 9. Follow up in 4 months.

## 2018-11-17 ENCOUNTER — Ambulatory Visit: Payer: Medicaid Other

## 2018-11-17 ENCOUNTER — Telehealth: Payer: Self-pay | Admitting: Neurology

## 2018-11-17 NOTE — Telephone Encounter (Signed)
Patient called regarding her Insurance and wanting to see about Shots in the office? Please Call. Thanks

## 2018-11-18 ENCOUNTER — Ambulatory Visit: Payer: Medicaid Other

## 2018-11-18 ENCOUNTER — Other Ambulatory Visit: Payer: Self-pay

## 2018-11-18 DIAGNOSIS — G43709 Chronic migraine without aura, not intractable, without status migrainosus: Secondary | ICD-10-CM

## 2018-11-18 DIAGNOSIS — IMO0002 Reserved for concepts with insufficient information to code with codable children: Secondary | ICD-10-CM

## 2018-11-18 MED ORDER — GALCANEZUMAB-GNLM 120 MG/ML ~~LOC~~ SOAJ: 240.0000 mg | Freq: Once | SUBCUTANEOUS | 0 refills | Status: AC

## 2018-11-18 NOTE — Progress Notes (Signed)
Pt came in for injection, loading dose (sample, 2 pens) of Emgality.  Pt received an injection of 120 mg in each upper lateral thigh, and tolerated the injections well. I answered all questions and the Pt stated she felt confident going forward to inject at home monthly.

## 2018-11-19 NOTE — Telephone Encounter (Signed)
Pt cam in office yesterday for [facility]

## 2018-12-08 ENCOUNTER — Telehealth: Payer: Self-pay | Admitting: Neurology

## 2018-12-08 NOTE — Telephone Encounter (Signed)
Called and spoke with Pt, advised her I am sending rqst to [facility] and when I rcv a determination, I will be in touch with her. Confirmed her pharmacy.

## 2018-12-08 NOTE — Telephone Encounter (Signed)
Patient left VM about a shot for migraines that she was supposed to have. She said she hasn't heard anything else about this. Please call her back with more info at [phone]. Thanks!

## 2018-12-21 ENCOUNTER — Telehealth: Payer: Self-pay | Admitting: Neurology

## 2018-12-21 NOTE — Telephone Encounter (Signed)
2 nd time  Pt called about her migraine shot. Please call her at [phone]

## 2018-12-21 NOTE — Telephone Encounter (Signed)
Patient left VM about migraine shot. Please call her back at [phone]. Thanks!

## 2018-12-22 NOTE — Telephone Encounter (Signed)
Called and spoke with Pt. I have not heard from [facility] ([facility]) about the Emgality. Pt will come by to p/u sample.

## 2018-12-23 ENCOUNTER — Other Ambulatory Visit: Payer: Self-pay

## 2018-12-23 MED ORDER — GALCANEZUMAB-GNLM 120 MG/ML ~~LOC~~ SOAJ: 120.0000 mg | SUBCUTANEOUS | 11 refills | Status: DC

## 2019-01-06 ENCOUNTER — Ambulatory Visit: Payer: Medicaid Other | Admitting: Neurology

## 2019-01-11 NOTE — Progress Notes (Signed)
Emgality 140mg  was approved PA# [id number].

## 2019-01-25 ENCOUNTER — Ambulatory Visit: Payer: Medicaid Other | Admitting: Maternal Newborn

## 2019-02-01 ENCOUNTER — Other Ambulatory Visit (HOSPITAL_COMMUNITY)
Admission: RE | Admit: 2019-02-01 | Discharge: 2019-02-01 | Disposition: A | Discharge status: Home / Self Care | Payer: Medicaid Other | Source: Ambulatory Visit | Attending: Maternal Newborn | Admitting: Maternal Newborn

## 2019-02-01 ENCOUNTER — Other Ambulatory Visit: Payer: Self-pay

## 2019-02-01 ENCOUNTER — Ambulatory Visit (INDEPENDENT_AMBULATORY_CARE_PROVIDER_SITE_OTHER): Payer: Medicaid Other | Admitting: Maternal Newborn

## 2019-02-01 ENCOUNTER — Encounter: Payer: Self-pay | Admitting: Maternal Newborn

## 2019-02-01 VITALS — BP 114/68 | Ht 65.0 in | Wt 209.8 lb

## 2019-02-01 DIAGNOSIS — B373 Candidiasis of vulva and vagina: Secondary | ICD-10-CM

## 2019-02-01 DIAGNOSIS — N898 Other specified noninflammatory disorders of vagina: Secondary | ICD-10-CM | POA: Diagnosis not present

## 2019-02-01 DIAGNOSIS — Z113 Encounter for screening for infections with a predominantly sexual mode of transmission: Secondary | ICD-10-CM | POA: Diagnosis not present

## 2019-02-01 DIAGNOSIS — B3731 Acute candidiasis of vulva and vagina: Secondary | ICD-10-CM

## 2019-02-01 MED ORDER — FLUCONAZOLE 150 MG PO TABS: 150.0000 mg | ORAL_TABLET | Freq: Once | ORAL | 0 refills | Status: AC

## 2019-02-01 NOTE — Progress Notes (Signed)
Obstetrics & Gynecology Office Visit   Chief Complaint:  Chief Complaint  Patient presents with  . Vaginal Discharge    itchiness/irritation/swollen, no odor x 2 months    History of Present Illness: Danielle Pollard has had external vulvar irritation, itching, and swelling on the left side of her vagina and vulva since she shaved about two months ago. She has noticed a thick white discharge at times. Her discharge does not have an odor. She has not tried any medication to help her symptoms. Nothing has made the symptoms better or worse. She does not have any open sores or excoriation. She has no pelvic pain. She would like to be tested for STIs today.  Review of Systems: Review of systems negative unless otherwise noted in HPI  Past Medical History:  Past Medical History:  Diagnosis Date  . Anemia   . Asthma    WELL CONTROLLED-LAST USED INHALER IN 2012  . Migraines    MIGRAINES  . Sickle cell trait [facility])     Past Surgical History:  Past Surgical History:  Procedure Laterality Date  . ABDOMINAL HYSTERECTOMY    . CESAREAN SECTION    . DIAGNOSTIC LAPAROSCOPY  2012   Dr. Laurey Morale; lysis of adhesions  . LAPAROSCOPIC HYSTERECTOMY Bilateral 01/05/2018   Procedure: HYSTERECTOMY TOTAL LAPAROSCOPIC, BILATERAL SALPINGECTOMY;  Surgeon: Gae Dry, MD;  Location: [facility] ORS;  Service: Gynecology;  Laterality: Bilateral;  . LAPAROSCOPY N/A 01/05/2018   Procedure: LAPAROSCOPY DIAGNOSTIC;  Surgeon: Gae Dry, MD;  Location: [facility] ORS;  Service: Gynecology;  Laterality: N/A;    Gynecologic History: Patient's last menstrual period was 05/18/2016 (approximate).  Obstetric History: G1P1001  Family History:  Family History  Problem Relation Age of Onset  . Hypertension Maternal Grandmother   . Hypertension Maternal Grandfather   . Breast cancer Neg Hx     Social History:  Social History   Socioeconomic History  . Marital status: Single    Spouse name: Not on file  . Number of  children: Not on file  . Years of education: Not on file  . Highest education level: Not on file  Occupational History  . Not on file  Social Needs  . Financial resource strain: Not on file  . Food insecurity    Worry: Not on file    Inability: Not on file  . Transportation needs    Medical: Not on file    Non-medical: Not on file  Tobacco Use  . Smoking status: Never Smoker  . Smokeless tobacco: Never Used  Substance and Sexual Activity  . Alcohol use: No  . Drug use: No  . Sexual activity: Yes    Birth control/protection: Surgical    Comment: Hysterectomy  Lifestyle  . Physical activity    Days per week: 0 days    Minutes per session: Not on file  . Stress: Not on file  Relationships  . Social Herbalist on phone: Not on file    Gets together: Not on file    Attends religious service: Not on file    Active member of club or organization: Not on file    Attends meetings of clubs or organizations: Not on file    Relationship status: Not on file  . Intimate partner violence    Fear of current or ex partner: Not on file    Emotionally abused: Not on file    Physically abused: Not on file    Forced sexual activity: Not  on file  Other Topics Concern  . Not on file  Social History Narrative  . Not on file    Allergies:  No Known Allergies  Medications: Prior to Admission medications   Medication Sig Start Date End Date Taking? Authorizing Provider  albuterol (PROVENTIL HFA;VENTOLIN HFA) 108 (90 Base) MCG/ACT inhaler Inhale 2 puffs into the lungs every 6 (six) hours as needed for wheezing or shortness of breath.   Yes [provider]  Galcanezumab-gnlm (EMGALITY) 120 MG/ML SOAJ Inject 120 mg into the skin every 30 (thirty) days. 12/23/18  Yes Jaffe, Adam R, DO  rizatriptan (MAXALT) 10 MG tablet Take 1 tablet earliest onset of migraine.  May repeat in 2 hours if needed.  Max 2 tablets in 24 hrs 11/12/18  Yes Drema DallasJaffe, Adam R, DO    Physical Exam  Vitals:  Vitals:   02/01/19 1453  BP: 114/68   Patient's last menstrual period was 05/18/2016 (approximate).  General: NAD Pulmonary: No increased work of breathing Genitourinary:  External: Left labia swollen, no sores or masses present.  Otherwise, normal external female genitalia.  Normal  urethral meatus, normal Bartholin's and Skene's glands.    Vagina: Normal vaginal mucosa, no evidence of prolapse,  small amount of white discharge.    Lymphatic: no evidence of inguinal lymphadenopathy Neurologic: Grossly intact Psychiatric: mood appropriate, affect full  Wet prep positive for yeast, negative for clue cells, negative whiff, pH >5.  Assessment: 33 y.o. G1P1001 with vulvar/vaginal itching and irritation, yeast on wet prep  Plan: Problem List Items Addressed This Visit    None    Visit Diagnoses    Vulvovaginal candidiasis    -  Primary   Routine screening for STI (sexually transmitted infection)       Relevant Orders   Cervicovaginal ancillary only   Vaginal irritation       Relevant Orders   Cervicovaginal ancillary only     1) Diflucan Rx sent to pharmacy 2) Aptima testing for BV/STIs.  Marcelyn BruinsJacelyn , CNM 02/01/2019

## 2019-02-02 ENCOUNTER — Encounter: Payer: Self-pay | Admitting: Maternal Newborn

## 2019-02-04 LAB — CERVICOVAGINAL ANCILLARY ONLY
Bacterial vaginitis: POSITIVE — AB
Chlamydia: NEGATIVE
Neisseria Gonorrhea: NEGATIVE
Trichomonas: NEGATIVE

## 2019-02-07 ENCOUNTER — Other Ambulatory Visit: Payer: Self-pay | Admitting: Maternal Newborn

## 2019-02-07 DIAGNOSIS — B9689 Other specified bacterial agents as the cause of diseases classified elsewhere: Secondary | ICD-10-CM

## 2019-02-07 MED ORDER — METRONIDAZOLE 500 MG PO TABS: 500.0000 mg | ORAL_TABLET | Freq: Two times a day (BID) | ORAL | 0 refills | Status: AC

## 2019-02-07 NOTE — Progress Notes (Signed)
Rx sent for Flagyl

## 2019-02-08 ENCOUNTER — Ambulatory Visit: Payer: Medicaid Other

## 2019-02-08 ENCOUNTER — Encounter: Payer: Self-pay | Admitting: Podiatry

## 2019-02-08 ENCOUNTER — Ambulatory Visit: Payer: Medicaid Other | Admitting: Podiatry

## 2019-02-08 ENCOUNTER — Other Ambulatory Visit: Payer: Self-pay

## 2019-02-08 VITALS — Temp 97.0°F

## 2019-02-08 DIAGNOSIS — M2580 Other specified joint disorders, unspecified joint: Secondary | ICD-10-CM

## 2019-02-08 DIAGNOSIS — G5790 Unspecified mononeuropathy of unspecified lower limb: Secondary | ICD-10-CM

## 2019-02-08 DIAGNOSIS — M216X2 Other acquired deformities of left foot: Secondary | ICD-10-CM | POA: Diagnosis not present

## 2019-02-08 MED ORDER — MELOXICAM 15 MG PO TABS: 15.0000 mg | ORAL_TABLET | Freq: Every day | ORAL | 1 refills | Status: AC

## 2019-02-08 MED ORDER — METHYLPREDNISOLONE 4 MG PO TBPK: ORAL_TABLET | ORAL | 0 refills | Status: DC

## 2019-02-10 NOTE — Progress Notes (Signed)
   HPI: 33 y.o. female presenting today for follow up evaluation of left foot pain. She states the pain has returned since her last visit and is more intense. She states she is willing to try injections at this time. There are no modifying factors noted. She has taken Meloxicam and Gabapentin for treatment. Patient is here for further evaluation and treatment.   Past Medical History:  Diagnosis Date  . Anemia   . Asthma    WELL CONTROLLED-LAST USED INHALER IN 2012  . Migraines    MIGRAINES  . Sickle cell trait [facility])      Physical Exam: General: The patient is alert and oriented x3 in no acute distress.  Dermatology: Skin is warm, dry and supple bilateral lower extremities. Negative for open lesions or macerations.  Vascular: Palpable pedal pulses bilaterally. No edema or erythema noted. Capillary refill within normal limits.  Neurological: Epicritic and protective threshold grossly intact bilaterally.   Musculoskeletal Exam: Pain with palpation to the left sesamoid apparatus. Range of motion within normal limits to all pedal and ankle joints bilateral. Muscle strength 5/5 in all groups bilateral.   Assessment: 1. Sesamoiditis left    Plan of Care:  1. Patient evaluated.  2. Injection of 0.5 mLs of Celestone Soluspan injected into the sesamoidal apparatus. 3. Prescription for Medrol Dose Pak provided to patient. 4. Prescription for Meloxicam provided to patient. 5. Offloading dancer's pads dispensed.  6. Recommended good shoe gear.  7. Return to clinic in 4 weeks.   Works at [employer].      Edrick Kins, DPM [facility]  Dr. Edrick Kins, DPM    [address], [place] [address]                Office [phone]  Fax [phone]

## 2019-02-23 ENCOUNTER — Ambulatory Visit
Admission: RE | Admit: 2019-02-23 | Discharge: 2019-02-23 | Disposition: A | Discharge status: Home / Self Care | Payer: Medicaid Other | Source: Ambulatory Visit | Attending: Maternal Newborn | Admitting: Maternal Newborn

## 2019-02-23 DIAGNOSIS — N6489 Other specified disorders of breast: Secondary | ICD-10-CM

## 2019-03-08 ENCOUNTER — Ambulatory Visit (INDEPENDENT_AMBULATORY_CARE_PROVIDER_SITE_OTHER): Payer: Medicaid Other | Admitting: Podiatry

## 2019-03-08 ENCOUNTER — Encounter: Payer: Self-pay | Admitting: Podiatry

## 2019-03-08 ENCOUNTER — Other Ambulatory Visit: Payer: Self-pay

## 2019-03-08 VITALS — Temp 97.9°F

## 2019-03-08 DIAGNOSIS — M216X2 Other acquired deformities of left foot: Secondary | ICD-10-CM

## 2019-03-08 DIAGNOSIS — M2580 Other specified joint disorders, unspecified joint: Secondary | ICD-10-CM

## 2019-03-09 NOTE — Progress Notes (Signed)
   HPI: 33 y.o. female presenting today for follow up evaluation of sesamoiditis of the left foot. She states her pain has worsened. She reports radiating pain that goes up the leg to the knee. Taking Meloxicam and using the dancer's pads have been ineffective in treating her pain. Walking and standing increases the symptoms. Patient is here for further evaluation and treatment.   Past Medical History:  Diagnosis Date  . Anemia   . Asthma    WELL CONTROLLED-LAST USED INHALER IN 2012  . Migraines    MIGRAINES  . Sickle cell trait [facility])      Physical Exam: General: The patient is alert and oriented x3 in no acute distress.  Dermatology: Skin is warm, dry and supple bilateral lower extremities. Negative for open lesions or macerations.  Vascular: Palpable pedal pulses bilaterally. No edema or erythema noted. Capillary refill within normal limits.  Neurological: Epicritic and protective threshold grossly intact bilaterally.   Musculoskeletal Exam: Pain with palpation to the left sesamoid apparatus. Range of motion within normal limits to all pedal and ankle joints bilateral. Muscle strength 5/5 in all groups bilateral.   Assessment: 1. Sesamoiditis left - unchanged   Plan of Care:  1. Patient evaluated.  2. MRI of the left forefoot ordered.  3. Continue taking Meloxicam.  4. Continue using dancer's pads.  5. Return to clinic in 3 weeks.    Works at [employer].      Edrick Kins, DPM [facility]  Dr. Edrick Kins, DPM    [address], [place] [address]                Office [phone]  Fax [phone]

## 2019-03-10 ENCOUNTER — Telehealth: Payer: Self-pay

## 2019-03-10 NOTE — Telephone Encounter (Signed)
PA request has been submitted to [employer].  Will wait on approval.

## 2019-03-10 NOTE — Telephone Encounter (Signed)
-----   Message from Edrick Kins, DPM sent at 03/08/2019 10:15 AM EDT ----- Regarding: MRI left forefoot Please order MRI left forefoot.  Dx: chronic sesmoiditis/fracture left  Thanks, Dr. Amalia Hailey

## 2019-03-22 ENCOUNTER — Telehealth: Payer: Self-pay | Admitting: Podiatry

## 2019-03-22 NOTE — Telephone Encounter (Signed)
Patient is calling about her denial for MRI, she wants to know what to do next.

## 2019-03-24 NOTE — Telephone Encounter (Signed)
I spoke with patient and informed her that the MRI was denied and I spoke with [employer], they are faxing the appeals process form so that I can try to get it approved.  I informed her that I would call her as soon as I was able to get MRI approved.

## 2019-04-05 ENCOUNTER — Ambulatory Visit: Payer: Medicaid Other | Admitting: Podiatry

## 2019-04-05 ENCOUNTER — Other Ambulatory Visit: Payer: Self-pay

## 2019-04-05 ENCOUNTER — Encounter: Payer: Self-pay | Admitting: Podiatry

## 2019-04-05 DIAGNOSIS — M2580 Other specified joint disorders, unspecified joint: Secondary | ICD-10-CM

## 2019-04-05 DIAGNOSIS — M216X2 Other acquired deformities of left foot: Secondary | ICD-10-CM | POA: Diagnosis not present

## 2019-04-05 NOTE — Progress Notes (Signed)
   HPI: 33 y.o. female presenting today for follow up evaluation of sesamoiditis of the left foot.  Patient states that the pain is the same and constant.  We have tried multiple conservative modalities that have not improved the patient's symptoms including but not limited to anti-inflammatory injections, oral anti-inflammatories, immobilization, offloading.  Patient continues to experience significant pain and tenderness to the plantar aspect of the left great toe consistent with sesamoid pathology.  Last visit on 03/08/2019 an MRI was ordered however it was denied by insurance.  She presents for further treatment and evaluation   Past Medical History:  Diagnosis Date  . Anemia   . Asthma    WELL CONTROLLED-LAST USED INHALER IN 2012  . Migraines    MIGRAINES  . Sickle cell trait [facility])      Physical Exam: General: The patient is alert and oriented x3 in no acute distress.  Dermatology: Skin is warm, dry and supple bilateral lower extremities. Negative for open lesions or macerations.  Vascular: Palpable pedal pulses bilaterally. No edema or erythema noted. Capillary refill within normal limits.  Neurological: Epicritic and protective threshold grossly intact bilaterally.   Musculoskeletal Exam: Pain with palpation to the left sesamoid apparatus.  Concerning for possible chronic sesamoid fracture range of motion within normal limits to all pedal and ankle joints bilateral. Muscle strength 5/5 in all groups bilateral.   Assessment: 1. Sesamoiditis left - unchanged 2.  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 Patient evaluated.  2.  Today we are going to make an appeal to have the MRI performed.  This will be medically necessary and utilized for likely surgical planning.  Patient has had this ongoing pain for approximately 1 year now despite multiple treatment modalities to improve her symptoms.  MRI is necessary to visualize the sesamoidal apparatus as she likely  needs possible sesamoidectomy of the offending sesamoid bone. 3.  Return to clinic once MRI has been performed for surgical consultation  Works at [employer].      Edrick Kins, DPM [facility]  Dr. Edrick Kins, DPM    [address], [place] [address]                Office [phone]  Fax [phone]

## 2019-04-21 ENCOUNTER — Telehealth: Payer: Self-pay

## 2019-04-21 DIAGNOSIS — M2580 Other specified joint disorders, unspecified joint: Secondary | ICD-10-CM

## 2019-04-21 NOTE — Telephone Encounter (Signed)
MRI has been approved from 04/18/2019 to 10/15/2018  Auth # [id number] Patient notified of approval and will call scheduling to set up appt to her convenience.

## 2019-05-02 ENCOUNTER — Other Ambulatory Visit: Payer: Self-pay

## 2019-05-02 ENCOUNTER — Ambulatory Visit
Admission: RE | Admit: 2019-05-02 | Discharge: 2019-05-02 | Disposition: A | Discharge status: Home / Self Care | Payer: Medicaid Other | Source: Ambulatory Visit | Attending: Podiatry | Admitting: Podiatry

## 2019-05-02 DIAGNOSIS — M2580 Other specified joint disorders, unspecified joint: Secondary | ICD-10-CM | POA: Diagnosis present

## 2019-05-05 ENCOUNTER — Other Ambulatory Visit: Payer: Self-pay | Admitting: Obstetrics & Gynecology

## 2019-05-24 ENCOUNTER — Ambulatory Visit (INDEPENDENT_AMBULATORY_CARE_PROVIDER_SITE_OTHER): Payer: Medicaid Other | Admitting: Podiatry

## 2019-05-24 ENCOUNTER — Other Ambulatory Visit: Payer: Self-pay

## 2019-05-24 ENCOUNTER — Other Ambulatory Visit: Payer: Self-pay | Admitting: *Deleted

## 2019-05-24 ENCOUNTER — Encounter: Payer: Self-pay | Admitting: Podiatry

## 2019-05-24 DIAGNOSIS — M7752 Other enthesopathy of left foot: Secondary | ICD-10-CM | POA: Diagnosis not present

## 2019-05-24 DIAGNOSIS — M2580 Other specified joint disorders, unspecified joint: Secondary | ICD-10-CM

## 2019-05-24 DIAGNOSIS — Z20822 Contact with and (suspected) exposure to covid-19: Secondary | ICD-10-CM

## 2019-05-24 NOTE — Patient Instructions (Signed)
Pre-Operative Instructions  Congratulations, you have decided to take an important step towards improving your quality of life.  You can be assured that the doctors and staff at [facility] will be with you every step of the way.  Here are some important things you should know:  1. Plan to be at the surgery center/hospital at least 1 (one) hour prior to your scheduled time, unless otherwise directed by the surgical center/hospital staff.  You must have a responsible adult accompany you, remain during the surgery and drive you home.  Make sure you have directions to the surgical center/hospital to ensure you arrive on time. 2. If you are having surgery at [facility] or [facility], you will need a copy of your medical history and physical form from your family physician within one month prior to the date of surgery. We will give you a form for your primary physician to complete.  3. We make every effort to accommodate the date you request for surgery.  However, there are times where surgery dates or times have to be moved.  We will contact you as soon as possible if a change in schedule is required.   4. No aspirin/ibuprofen for one week before surgery.  If you are on aspirin, any non-steroidal anti-inflammatory medications (Mobic, Aleve, Ibuprofen) should not be taken seven (7) days prior to your surgery.  You make take Tylenol for pain prior to surgery.  5. Medications - If you are taking daily heart and blood pressure medications, seizure, reflux, allergy, asthma, anxiety, pain or diabetes medications, make sure you notify the surgery center/hospital before the day of surgery so they can tell you which medications you should take or avoid the day of surgery. 6. No food or drink after midnight the night before surgery unless directed otherwise by surgical center/hospital staff. 7. No alcoholic beverages 24-hours prior to surgery.  No smoking 24-hours prior or 24-hours after  surgery. 8. Wear loose pants or shorts. They should be loose enough to fit over bandages, boots, and casts. 9. Don't wear slip-on shoes. Sneakers are preferred. 10. Bring your boot with you to the surgery center/hospital.  Also bring crutches or a walker if your physician has prescribed it for you.  If you do not have this equipment, it will be provided for you after surgery. 11. If you have not been contacted by the surgery center/hospital by the day before your surgery, call to confirm the date and time of your surgery. 12. Leave-time from work may vary depending on the type of surgery you have.  Appropriate arrangements should be made prior to surgery with your employer. 13. Prescriptions will be provided immediately following surgery by your doctor.  Fill these as soon as possible after surgery and take the medication as directed. Pain medications will not be refilled on weekends and must be approved by the doctor. 14. Remove nail polish on the operative foot and avoid getting pedicures prior to surgery. 15. Wash the night before surgery.  The night before surgery wash the foot and leg well with water and the antibacterial soap provided. Be sure to pay special attention to beneath the toenails and in between the toes.  Wash for at least three (3) minutes. Rinse thoroughly with water and dry well with a towel.  Perform this wash unless told not to do so by your physician.  Enclosed: 1 Ice pack (please put in freezer the night before surgery)   1 Hibiclens skin cleaner     Pre-op instructions  If you have any questions regarding the instructions, please do not hesitate to call our office.  [place]: [address], [place], [place] [address] -- [phone]  [place]: [address] [place], [place] [address] -- [phone]  [place]: [address]  , [place] [address] -- [phone]   Website: [email or web address] 

## 2019-05-25 ENCOUNTER — Telehealth: Payer: Self-pay | Admitting: *Deleted

## 2019-05-25 LAB — NOVEL CORONAVIRUS, NAA: SARS-CoV-2, NAA: NOT DETECTED

## 2019-05-25 NOTE — Telephone Encounter (Signed)
"  I am calling to make an appointment for surgery."

## 2019-05-25 NOTE — Telephone Encounter (Signed)
I am returning your call.  You want to schedule your surgery?  "Yes, I do."  Do you have a date that you like?  "It doesn't matter."  Dr. Amalia Hailey can do it on Friday, June 17, 2019.  Is that date okay?  "Yes, that is fine."  Someone from the surgical center will give you a call a day or two prior to your surgery date and will give you your arrival time.  You need to register online via the surgical center's online portal.  The instructions to do that are in the brochure that we gave you.  "Okay, I'll take care of that."

## 2019-05-26 NOTE — Progress Notes (Signed)
   HPI: 33 y.o. female presenting today for follow up evaluation of left foot pain. She reports continued pain that is relatively unchanged since her last visit. She has been taking antiinflammatories for treatment. Walking and wearing shoes increases the pain. She has an MRI done on 05/02/2019. Patient is here for further evaluation and treatment.   Past Medical History:  Diagnosis Date  . Anemia   . Asthma    WELL CONTROLLED-LAST USED INHALER IN 2012  . Migraines    MIGRAINES  . Sickle cell trait [facility])      Physical Exam: General: The patient is alert and oriented x3 in no acute distress.  Dermatology: Skin is warm, dry and supple bilateral lower extremities. Negative for open lesions or macerations.  Vascular: Palpable pedal pulses bilaterally. No edema or erythema noted. Capillary refill within normal limits.  Neurological: Epicritic and protective threshold grossly intact bilaterally.   Musculoskeletal Exam: Range of motion within normal limits to all pedal and ankle joints bilateral. Muscle strength 5/5 in all groups bilateral.   MRI Impression:  1. No acute osseous injury of the left forefoot. 2. Mild intermetatarsal bursitis between the first and second metatarsal heads.   Assessment: 1. Fibular sesamoiditis left 2. 1st MPJ capsulitis left    Plan of Care:  1. Patient evaluated. MRI reviewed.  2. Today we discussed the conservative versus surgical management of the presenting pathology. The patient opts for surgical management. All possible complications and details of the procedure were explained. All patient questions were answered. No guarantees were expressed or implied. 3. Authorization for surgery was initiated today. Surgery will consist of fibular sesamoidectomy left; 1st MPJ capsulotomy left.  4. Return to clinic one week post op.   Works at [employer].      Edrick Kins, DPM [facility]  Dr. Edrick Kins, DPM    [address], [place] [address]                Office [phone]  Fax [phone]

## 2019-06-03 ENCOUNTER — Other Ambulatory Visit: Payer: Self-pay | Admitting: *Deleted

## 2019-06-03 DIAGNOSIS — Z20822 Contact with and (suspected) exposure to covid-19: Secondary | ICD-10-CM

## 2019-06-04 LAB — NOVEL CORONAVIRUS, NAA: SARS-CoV-2, NAA: NOT DETECTED

## 2019-06-07 NOTE — Progress Notes (Signed)
Center, [facility]   Chief Complaint  Patient presents with  . Vaginal Discharge    sour odor, itchiness, irritation hasn't cleared since last visit    HPI:      [name] is a 33 y.o. G1P1001 who LMP was Patient's last menstrual period was 05/18/2016 (approximate)., presents today for increased d/c with odor and irritation for a couple months. Hx of BV 7/20 with Gardnerella on culture, neg STD testing. Treated with flagyl BID for 7 days with sx relief, but sx recurred. Pt is sex active, not using condoms. No recent abx use. Uses dove sens skin soap, and dryer sheets. No probiotic use. No urin sx, no LBP, pelvic pain, fevers. No meds to treat this time.  Patient Active Problem List   Diagnosis Date Noted  . Migraine 05/10/2014  . Headache 04/18/2014  . Obesity, unspecified 04/18/2014    Past Surgical History:  Procedure Laterality Date  . ABDOMINAL HYSTERECTOMY    . CESAREAN SECTION    . DIAGNOSTIC LAPAROSCOPY  2012   Dr. Luella Cook; lysis of adhesions  . LAPAROSCOPIC HYSTERECTOMY Bilateral 01/05/2018   Procedure: HYSTERECTOMY TOTAL LAPAROSCOPIC, BILATERAL SALPINGECTOMY;  Surgeon: Nadara Mustard, MD;  Location: [facility] ORS;  Service: Gynecology;  Laterality: Bilateral;  . LAPAROSCOPY N/A 01/05/2018   Procedure: LAPAROSCOPY DIAGNOSTIC;  Surgeon: Nadara Mustard, MD;  Location: [facility] ORS;  Service: Gynecology;  Laterality: N/A;    Family History  Problem Relation Age of Onset  . Hypertension Maternal Grandmother   . Hypertension Maternal Grandfather   . Breast cancer Neg Hx     Social History   Socioeconomic History  . Marital status: Single    Spouse name: Not on file  . Number of children: Not on file  . Years of education: Not on file  . Highest education level: Not on file  Occupational History  . Not on file  Social Needs  . Financial resource strain: Not on file  . Food insecurity    Worry: Not on file    Inability: Not on file  .  Transportation needs    Medical: Not on file    Non-medical: Not on file  Tobacco Use  . Smoking status: Never Smoker  . Smokeless tobacco: Never Used  Substance and Sexual Activity  . Alcohol use: No  . Drug use: No  . Sexual activity: Yes    Birth control/protection: Surgical    Comment: Hysterectomy  Lifestyle  . Physical activity    Days per week: 0 days    Minutes per session: Not on file  . Stress: Not on file  Relationships  . Social Musician on phone: Not on file    Gets together: Not on file    Attends religious service: Not on file    Active member of club or organization: Not on file    Attends meetings of clubs or organizations: Not on file    Relationship status: Not on file  . Intimate partner violence    Fear of current or ex partner: Not on file    Emotionally abused: Not on file    Physically abused: Not on file    Forced sexual activity: Not on file  Other Topics Concern  . Not on file  Social History Narrative  . Not on file    Outpatient Medications Prior to Visit  Medication Sig Dispense Refill  . albuterol (PROVENTIL HFA;VENTOLIN HFA) 108 (90 Base) MCG/ACT inhaler  Inhale 2 puffs into the lungs every 6 (six) hours as needed for wheezing or shortness of breath.    . Galcanezumab-gnlm (EMGALITY) 120 MG/ML SOAJ Inject 120 mg into the skin every 30 (thirty) days. 1 pen 11  . meloxicam (MOBIC) 15 MG tablet Take 15 mg by mouth daily.    . rizatriptan (MAXALT) 10 MG tablet Take 1 tablet earliest onset of migraine.  May repeat in 2 hours if needed.  Max 2 tablets in 24 hrs 10 tablet 3  . fluconazole (DIFLUCAN) 150 MG tablet TAKE 1 TABLET BY MOUTH ONCE FOR 1 DOSE CAN TAKE ADDITIONAL DOSE THREE DAYS LATER IF SYMPTOMS PERSIST     No facility-administered medications prior to visit.       ROS:  Review of Systems  Constitutional: Negative for fever.  Gastrointestinal: Negative for blood in stool, constipation, diarrhea, nausea and vomiting.   Genitourinary: Positive for vaginal discharge. Negative for dyspareunia, dysuria, flank pain, frequency, hematuria, urgency, vaginal bleeding and vaginal pain.  Musculoskeletal: Negative for back pain.  Skin: Negative for rash.   BREAST: No symptoms   OBJECTIVE:   Vitals:  BP 130/80   Ht 5\' 5"  (1.651 m)   Wt 206 lb (93.4 kg)   LMP 05/18/2016 (Approximate)   BMI 34.28 kg/m   Physical Exam Vitals signs reviewed.  Constitutional:      Appearance: She is well-developed.  Neck:     Musculoskeletal: Normal range of motion.  Pulmonary:     Effort: Pulmonary effort is normal.  Genitourinary:    General: Normal vulva.     Pubic Area: No rash.      Labia:        Right: No rash, tenderness or lesion.        Left: No rash, tenderness or lesion.      Vagina: Vaginal discharge present. No erythema or tenderness.     Cervix: Normal.     Uterus: Normal. Not enlarged and not tender.      Adnexa: Right adnexa normal and left adnexa normal.       Right: No mass or tenderness.         Left: No mass or tenderness.    Musculoskeletal: Normal range of motion.  Skin:    General: Skin is warm and dry.  Neurological:     General: No focal deficit present.     Mental Status: She is alert and oriented to person, place, and time.  Psychiatric:        Mood and Affect: Mood normal.        Behavior: Behavior normal.        Thought Content: Thought content normal.        Judgment: Judgment normal.     Results: Results for orders placed or performed in visit on 06/08/19 (from the past 24 hour(s))  POCT Wet Prep with KOH     Status: Abnormal   Collection Time: 06/08/19  3:41 PM  Result Value Ref Range   Trichomonas, UA Negative    Clue Cells Wet Prep HPF POC pos    Epithelial Wet Prep HPF POC     Yeast Wet Prep HPF POC neg    Bacteria Wet Prep HPF POC     RBC Wet Prep HPF POC     WBC Wet Prep HPF POC     KOH Prep POC Positive (A) Negative     Assessment/Plan: Bacterial vaginosis -  Plan: POCT Wet Prep with KOH, metroNIDAZOLE (FLAGYL) 500  MG tablet; Pos sx/wet prep. Rx flagyl. Add probiotics/condoms. F/u prn. Will refill if sx recur or try clindamycin if sx don't resolve.   Meds ordered this encounter  Medications  . metroNIDAZOLE (FLAGYL) 500 MG tablet    Sig: Take 1 tablet (500 mg total) by mouth 2 (two) times daily for 7 days.    Dispense:  14 tablet    Refill:  0    Order Specific Question:   Supervising Provider    Answer:   Gae Dry [[id number]]      Return if symptoms worsen or fail to improve.   B. , PA-C 06/08/2019 3:42 PM

## 2019-06-08 ENCOUNTER — Encounter: Payer: Self-pay | Admitting: Obstetrics and Gynecology

## 2019-06-08 ENCOUNTER — Ambulatory Visit (INDEPENDENT_AMBULATORY_CARE_PROVIDER_SITE_OTHER): Payer: Medicaid Other | Admitting: Obstetrics and Gynecology

## 2019-06-08 ENCOUNTER — Other Ambulatory Visit: Payer: Self-pay

## 2019-06-08 VITALS — BP 130/80 | Ht 65.0 in | Wt 206.0 lb

## 2019-06-08 DIAGNOSIS — N760 Acute vaginitis: Secondary | ICD-10-CM

## 2019-06-08 DIAGNOSIS — B9689 Other specified bacterial agents as the cause of diseases classified elsewhere: Secondary | ICD-10-CM

## 2019-06-08 LAB — POCT WET PREP WITH KOH
Clue Cells Wet Prep HPF POC: POSITIVE
KOH Prep POC: POSITIVE — AB
Trichomonas, UA: NEGATIVE
Yeast Wet Prep HPF POC: NEGATIVE

## 2019-06-08 MED ORDER — METRONIDAZOLE 500 MG PO TABS: 500.0000 mg | ORAL_TABLET | Freq: Two times a day (BID) | ORAL | 0 refills | Status: AC

## 2019-06-08 NOTE — Patient Instructions (Signed)
I value your feedback and entrusting us with your care. If you get a [facility] patient survey, I would appreciate you taking the time to let us know about your experience today. Thank you! 

## 2019-06-17 ENCOUNTER — Other Ambulatory Visit: Payer: Self-pay | Admitting: Podiatry

## 2019-06-17 DIAGNOSIS — M216X2 Other acquired deformities of left foot: Secondary | ICD-10-CM | POA: Diagnosis not present

## 2019-06-17 MED ORDER — TRAMADOL HCL 50 MG PO TABS: 50.0000 mg | ORAL_TABLET | Freq: Three times a day (TID) | ORAL | 0 refills | Status: AC | PRN

## 2019-06-17 MED ORDER — OXYCODONE-ACETAMINOPHEN 5-325 MG PO TABS: 1.0000 | ORAL_TABLET | Freq: Four times a day (QID) | ORAL | 0 refills | Status: DC | PRN

## 2019-06-17 NOTE — Addendum Note (Signed)
Addended by: Hardie Pulley on: 06/17/2019 08:18 PM   Modules accepted: Orders

## 2019-06-17 NOTE — Progress Notes (Signed)
PRN postop 

## 2019-06-21 ENCOUNTER — Ambulatory Visit (INDEPENDENT_AMBULATORY_CARE_PROVIDER_SITE_OTHER): Payer: Medicaid Other

## 2019-06-21 ENCOUNTER — Encounter: Payer: Self-pay | Admitting: Podiatry

## 2019-06-21 ENCOUNTER — Other Ambulatory Visit: Payer: Self-pay

## 2019-06-21 ENCOUNTER — Ambulatory Visit (INDEPENDENT_AMBULATORY_CARE_PROVIDER_SITE_OTHER): Payer: Medicaid Other | Admitting: Podiatry

## 2019-06-21 VITALS — BP 107/75 | HR 98 | Temp 80.0°F

## 2019-06-21 DIAGNOSIS — M2580 Other specified joint disorders, unspecified joint: Secondary | ICD-10-CM

## 2019-06-21 DIAGNOSIS — M216X2 Other acquired deformities of left foot: Secondary | ICD-10-CM

## 2019-06-21 DIAGNOSIS — Z9889 Other specified postprocedural states: Secondary | ICD-10-CM

## 2019-06-22 ENCOUNTER — Encounter: Payer: Self-pay | Admitting: Podiatry

## 2019-06-22 NOTE — Progress Notes (Signed)
  Subjective:  Patient ID: Danielle Pollard, female    DOB: Oct 11, 1985,  MRN: [id number]  Chief Complaint  Patient presents with  . Routine Post Op    pov#1 dos 11.20.2020 Seasmoidectomy Lt, Capsulotomy MPJ Release Joint 1st Lt     33 y.o. female returns for post-op check.  Patient states that she is doing well.  She denies any acute complaint.  She states that she has some pain to the incision site.  However she is in good spirit.  She denies any nausea fever chills vomiting.  Review of Systems: Negative except as noted in the HPI. Denies N/V/F/Ch.  Past Medical History:  Diagnosis Date  . Anemia   . Asthma    WELL CONTROLLED-LAST USED INHALER IN 2012  . Migraines    MIGRAINES  . Sickle cell trait ([facility])     Current Outpatient Medications:  .  albuterol (PROVENTIL HFA;VENTOLIN HFA) 108 (90 Base) MCG/ACT inhaler, Inhale 2 puffs into the lungs every 6 (six) hours as needed for wheezing or shortness of breath., Disp: , Rfl:  .  Galcanezumab-gnlm (EMGALITY) 120 MG/ML SOAJ, Inject 120 mg into the skin every 30 (thirty) days., Disp: 1 pen, Rfl: 11 .  meloxicam (MOBIC) 15 MG tablet, Take 15 mg by mouth daily., Disp: , Rfl:  .  oxyCODONE-acetaminophen (PERCOCET) 5-325 MG tablet, Take 1 tablet by mouth every 6 (six) hours as needed for severe pain., Disp: 30 tablet, Rfl: 0 .  rizatriptan (MAXALT) 10 MG tablet, Take 1 tablet earliest onset of migraine.  May repeat in 2 hours if needed.  Max 2 tablets in 24 hrs, Disp: 10 tablet, Rfl: 3 .  traMADol (ULTRAM) 50 MG tablet, Take 1 tablet (50 mg total) by mouth every 8 (eight) hours as needed for up to 5 days., Disp: 15 tablet, Rfl: 0  Social History   Tobacco Use  Smoking Status Never Smoker  Smokeless Tobacco Never Used    No Known Allergies Objective:   Vitals:   06/21/19 0935  BP: 107/75  Pulse: 98  Temp: (!) 80 F (26.7 C)   There is no height or weight on file to calculate BMI. Constitutional Well developed. Well nourished.   Vascular Foot warm and well perfused. Capillary refill normal to all digits.   Neurologic Normal speech. Oriented to person, place, and time. Epicritic sensation to light touch grossly present bilaterally.  Dermatologic Skin healing well without signs of infection. Skin edges well coapted without signs of infection.  Mild erythema noted around the incision site.  Orthopedic: Tenderness to palpation noted about the surgical site.   Radiographs: 2 views of skeletally mature adult: Sesamoid excised fibular.  No soft tissue emphysema.  Mild dorsal soft tissue swelling noted.  No other bony deformities noted. Assessment:   1. Sesamoiditis   2. S/P foot surgery, left    Plan:  Patient was evaluated and treated and all questions answered.  S/p foot surgery left -Progressing as expected post-operatively. -XR: See above -WB Status: Nonweightbearing in cam boot with crutches -Sutures: Sutures intact no signs of dehiscence.  Mild erythema noted around the incision site less than 2 cm.  I will dispense doxycycline for skin and soft tissue prophylaxis -Medications: Doxycycline was dispensed -Foot redressed.  No follow-ups on file.

## 2019-06-28 ENCOUNTER — Encounter: Payer: Self-pay | Admitting: Podiatry

## 2019-06-28 ENCOUNTER — Ambulatory Visit (INDEPENDENT_AMBULATORY_CARE_PROVIDER_SITE_OTHER): Payer: Medicaid Other | Admitting: Podiatry

## 2019-06-28 ENCOUNTER — Other Ambulatory Visit: Payer: Self-pay

## 2019-06-28 DIAGNOSIS — Z9889 Other specified postprocedural states: Secondary | ICD-10-CM

## 2019-06-28 DIAGNOSIS — M7752 Other enthesopathy of left foot: Secondary | ICD-10-CM

## 2019-06-28 DIAGNOSIS — M2580 Other specified joint disorders, unspecified joint: Secondary | ICD-10-CM

## 2019-07-01 NOTE — Progress Notes (Signed)
   Subjective:  Patient presents today status post fibular sesamoidectomy with 1st MPJ capsulotomy left. DOS: 06/17/2019. She states she is doing well. She reports some continued numbness of the midfoot to the toes. She denies any significant pain or modifying factors. She has been using the CAM boot as directed. Patient is here for further evaluation and treatment.    Past Medical History:  Diagnosis Date  . Anemia   . Asthma    WELL CONTROLLED-LAST USED INHALER IN 2012  . Migraines    MIGRAINES  . Sickle cell trait ([place])       Objective/Physical Exam Neurovascular status intact.  Skin incisions appear to be well coapted with sutures and staples intact. No sign of infectious process noted. No dehiscence. No active bleeding noted. Moderate edema noted to the surgical extremity.  Radiographic Exam:  Orthopedic hardware and osteotomies sites appear to be stable with routine healing.  Assessment: 1. s/p fibular sesamoidectomy with 1st MPJ capsulotomy left. DOS: 06/17/2019   Plan of Care:  1. Patient was evaluated.  2. Dressing changed.  3. Post op shoe dispensed. Discontinue CAM boot.  4. Return to clinic in 10 days for suture removal and return to work.   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    Edrick Kins, DPM [facility]  Dr. Edrick Kins, [place]                                        [place], [place] [address]                Office [phone]  Fax [phone]

## 2019-07-04 ENCOUNTER — Telehealth: Payer: Self-pay | Admitting: *Deleted

## 2019-07-04 ENCOUNTER — Other Ambulatory Visit: Payer: Self-pay | Admitting: Podiatry

## 2019-07-04 MED ORDER — OXYCODONE-ACETAMINOPHEN 5-325 MG PO TABS: 1.0000 | ORAL_TABLET | Freq: Four times a day (QID) | ORAL | 0 refills | Status: DC | PRN

## 2019-07-04 NOTE — Telephone Encounter (Signed)
I spoke with pt and she states she has a continuing sharp pain when she walks and would like a refill of the pain medication.

## 2019-07-04 NOTE — Telephone Encounter (Signed)
Pt called for pain medication refill. 

## 2019-07-04 NOTE — Telephone Encounter (Signed)
Rx sent 

## 2019-07-04 NOTE — Progress Notes (Signed)
PRN postop pain 

## 2019-07-08 ENCOUNTER — Ambulatory Visit (INDEPENDENT_AMBULATORY_CARE_PROVIDER_SITE_OTHER): Payer: Medicaid Other | Admitting: Podiatry

## 2019-07-08 ENCOUNTER — Encounter: Payer: Self-pay | Admitting: Podiatry

## 2019-07-08 ENCOUNTER — Other Ambulatory Visit: Payer: Self-pay

## 2019-07-08 DIAGNOSIS — M2580 Other specified joint disorders, unspecified joint: Secondary | ICD-10-CM

## 2019-07-08 DIAGNOSIS — Z9889 Other specified postprocedural states: Secondary | ICD-10-CM

## 2019-07-12 ENCOUNTER — Other Ambulatory Visit: Payer: Medicaid Other

## 2019-07-12 NOTE — Progress Notes (Signed)
   Subjective:  Patient presents today status post fibular sesamoidectomy with 1st MPJ capsulotomy left. DOS: 06/17/2019. She reports continued pain to the posterior leg that has been the same since her last visit. She reports associated swelling. She has been using the post op shoe as directed. There are no modifying factors. Patient is here for further evaluation and treatment.    Past Medical History:  Diagnosis Date  . Anemia   . Asthma    WELL CONTROLLED-LAST USED INHALER IN 2012  . Migraines    MIGRAINES  . Sickle cell trait ([place])       Objective/Physical Exam Neurovascular status intact.  Skin incisions appear to be well coapted with sutures and staples intact. No sign of infectious process noted. No dehiscence. No active bleeding noted. Moderate edema noted to the surgical extremity.   Assessment: 1. s/p fibular sesamoidectomy with 1st MPJ capsulotomy left. DOS: 06/17/2019   Plan of Care:  1. Patient was evaluated.  2. Sutures removed.  3. Transition from post op shoe into good shoe gear.  4. Return to work 07/18/2019.  5. Return to clinic in 4 weeks.   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    Edrick Kins, DPM [facility]  Dr. Edrick Kins, [place]                                        [place], [place] [address]                Office [phone]  Fax [phone]

## 2019-07-17 ENCOUNTER — Other Ambulatory Visit: Payer: Self-pay

## 2019-07-17 ENCOUNTER — Encounter: Payer: Self-pay | Admitting: Emergency Medicine

## 2019-07-17 ENCOUNTER — Emergency Department
Admission: EM | Admit: 2019-07-17 | Discharge: 2019-07-17 | Disposition: A | Discharge status: Home / Self Care | Payer: Medicaid Other | Attending: Emergency Medicine | Admitting: Emergency Medicine

## 2019-07-17 ENCOUNTER — Emergency Department: Payer: Medicaid Other

## 2019-07-17 DIAGNOSIS — J181 Lobar pneumonia, unspecified organism: Secondary | ICD-10-CM | POA: Diagnosis not present

## 2019-07-17 DIAGNOSIS — J189 Pneumonia, unspecified organism: Secondary | ICD-10-CM

## 2019-07-17 DIAGNOSIS — M79605 Pain in left leg: Secondary | ICD-10-CM | POA: Insufficient documentation

## 2019-07-17 DIAGNOSIS — U071 COVID-19: Secondary | ICD-10-CM | POA: Insufficient documentation

## 2019-07-17 DIAGNOSIS — Z20822 Contact with and (suspected) exposure to covid-19: Secondary | ICD-10-CM

## 2019-07-17 DIAGNOSIS — R509 Fever, unspecified: Secondary | ICD-10-CM | POA: Diagnosis present

## 2019-07-17 DIAGNOSIS — Z79899 Other long term (current) drug therapy: Secondary | ICD-10-CM | POA: Diagnosis not present

## 2019-07-17 DIAGNOSIS — J45909 Unspecified asthma, uncomplicated: Secondary | ICD-10-CM | POA: Diagnosis not present

## 2019-07-17 LAB — SARS CORONAVIRUS 2 (TAT 6-24 HRS): SARS Coronavirus 2: POSITIVE — AB

## 2019-07-17 MED ORDER — ALBUTEROL SULFATE HFA 108 (90 BASE) MCG/ACT IN AERS
1.0000 | INHALATION_SPRAY | Freq: Once | RESPIRATORY_TRACT | Status: AC
  Administered 2019-07-17: 1 via RESPIRATORY_TRACT
  Filled 2019-07-17: qty 6.7

## 2019-07-17 MED ORDER — AZITHROMYCIN 250 MG PO TABS: ORAL_TABLET | ORAL | 0 refills | Status: DC

## 2019-07-17 MED ORDER — SODIUM CHLORIDE 0.9 % IV BOLUS: 500.0000 mL | Freq: Once | INTRAVENOUS | Status: DC

## 2019-07-17 MED ORDER — PREDNISONE 10 MG (21) PO TBPK: ORAL_TABLET | ORAL | 0 refills | Status: DC

## 2019-07-17 MED ORDER — CEFTRIAXONE SODIUM 1 G IJ SOLR: 1.0000 g | Freq: Once | INTRAMUSCULAR | Status: DC

## 2019-07-17 MED ORDER — ACETAMINOPHEN 325 MG PO TABS
650.0000 mg | ORAL_TABLET | Freq: Once | ORAL | Status: AC
  Administered 2019-07-17: 650 mg via ORAL
  Filled 2019-07-17: qty 2

## 2019-07-17 NOTE — ED Notes (Signed)
Report from [facility] at this time.

## 2019-07-17 NOTE — ED Triage Notes (Signed)
Pt reports fever, chills, bodyaches and sore throat for a few days. Pt reports was staying with her sister and her sister's boyfriend tested positive for COVID on 12/12.

## 2019-07-17 NOTE — ED Provider Notes (Signed)
[facility] Emergency Department Provider Note  ____________________________________________  Time seen: Approximately 8:59 AM  I have reviewed the triage vital signs and the nursing notes.   HISTORY  Chief Complaint Fever, Chills, and Generalized Body Aches    HPI Danielle Pollard is a 33 y.o. female that presents to the emergency department for evaluation of fever, chills, body aches, sore throat, cough, shortness of breath since last night.  Patient has a history of asthma but has not needed an albuterol inhaler recently.  Patient's sister's boyfriend tested positive for COVID-19 last week.  Patient does not smoke.  She did have a foot surgery on November 20 but was never bedbound following procedure.  No recent travel.  She is not on any oral contraceptives.  No vomiting, abdominal pain, diarrhea.   Past Medical History:  Diagnosis Date  . Anemia   . Asthma    WELL CONTROLLED-LAST USED INHALER IN 2012  . Migraines    MIGRAINES  . Sickle cell trait [facility])     Patient Active Problem List   Diagnosis Date Noted  . Migraine 05/10/2014  . Headache 04/18/2014  . Obesity, unspecified 04/18/2014    Past Surgical History:  Procedure Laterality Date  . ABDOMINAL HYSTERECTOMY    . CESAREAN SECTION    . DIAGNOSTIC LAPAROSCOPY  2012   Dr. Laurey Morale; lysis of adhesions  . LAPAROSCOPIC HYSTERECTOMY Bilateral 01/05/2018   Procedure: HYSTERECTOMY TOTAL LAPAROSCOPIC, BILATERAL SALPINGECTOMY;  Surgeon: Gae Dry, MD;  Location: [facility] ORS;  Service: Gynecology;  Laterality: Bilateral;  . LAPAROSCOPY N/A 01/05/2018   Procedure: LAPAROSCOPY DIAGNOSTIC;  Surgeon: Gae Dry, MD;  Location: [facility] ORS;  Service: Gynecology;  Laterality: N/A;    Prior to Admission medications   Medication Sig Start Date End Date Taking? Authorizing Provider  albuterol (PROVENTIL HFA;VENTOLIN HFA) 108 (90 Base) MCG/ACT inhaler Inhale 2 puffs into the lungs every 6 (six)  hours as needed for wheezing or shortness of breath.   Yes [provider]  Galcanezumab-gnlm (EMGALITY) 120 MG/ML SOAJ Inject 120 mg into the skin every 30 (thirty) days. 12/23/18  Yes Jaffe, Adam R, DO  oxyCODONE-acetaminophen (PERCOCET) 5-325 MG tablet Take 1 tablet by mouth every 6 (six) hours as needed for severe pain. 07/04/19  Yes Edrick Kins, DPM  rizatriptan (MAXALT) 10 MG tablet Take 1 tablet earliest onset of migraine.  May repeat in 2 hours if needed.  Max 2 tablets in 24 hrs 11/12/18  Yes Tomi Likens, Adam R, DO  azithromycin (ZITHROMAX Z-PAK) 250 MG tablet 2 pills today then 1 pill a day for 4 days 07/17/19   Caryn Section Linden Dolin, PA-C  predniSONE (STERAPRED UNI-PAK 21 TAB) 10 MG (21) TBPK tablet Take 6 pills on day one then decrease by 1 pill each day 07/17/19   Versie Starks, PA-C    Allergies Patient has no known allergies.  Family History  Problem Relation Age of Onset  . Hypertension Maternal Grandmother   . Hypertension Maternal Grandfather   . Breast cancer Neg Hx     Social History Social History   Tobacco Use  . Smoking status: Never Smoker  . Smokeless tobacco: Never Used  Substance Use Topics  . Alcohol use: No  . Drug use: No     Review of Systems  Constitutional: Positive for fever and chills. Eyes: No visual changes. No discharge. ENT: Positive for congestion and rhinorrhea. Respiratory: Positive for cough and SOB. Gastrointestinal: No abdominal pain.  No nausea, no  vomiting.  No diarrhea.  No constipation. Musculoskeletal: Positive for body aches. Skin: Negative for rash, abrasions, lacerations, ecchymosis. Neurological: Negative for headaches.   ____________________________________________   PHYSICAL EXAM:  VITAL SIGNS: ED Triage Vitals [07/17/19 0836]  Enc Vitals Group     BP      Pulse      Resp      Temp      Temp src      SpO2      Weight 206 lb (93.4 kg)     Height 5\' 5"  (1.651 m)     Head Circumference      Peak Flow       Pain Score 10     Pain Loc      Pain Edu?      Excl. in GC?      Constitutional: Alert and oriented. Well appearing and in no acute distress. Eyes: Conjunctivae are normal. PERRL. EOMI. No discharge. Head: Atraumatic. ENT: No frontal and maxillary sinus tenderness.      Ears: Tympanic membranes pearly gray with good landmarks. No discharge.      Nose: No congestion/rhinnorhea.      Mouth/Throat: Mucous membranes are moist. Oropharynx erythematous. Tonsils not enlarged. No exudates. Uvula midline. Neck: No stridor.   Hematological/Lymphatic/Immunilogical: No cervical lymphadenopathy. Cardiovascular: Tachycardic rate, regular rhythm.  Good peripheral circulation. Respiratory: Normal respiratory effort without tachypnea or retractions. Lungs CTAB. Good air entry to the bases with no decreased or absent breath sounds. Gastrointestinal: Bowel sounds 4 quadrants. Soft and nontender to palpation. No guarding or rigidity. No palpable masses. No distention. Musculoskeletal: Full range of motion to all extremities. No gross deformities appreciated. Neurologic:  Normal speech and language. No gross focal neurologic deficits are appreciated.  Skin:  Skin is warm, dry and intact. No rash noted. Psychiatric: Mood and affect are normal. Speech and behavior are normal. Patient exhibits appropriate insight and judgement.   ____________________________________________   LABS (all labs ordered are listed, but only abnormal results are displayed)  Labs Reviewed  SARS CORONAVIRUS 2 (TAT 6-24 HRS)   ____________________________________________  EKG   ____________________________________________  RADIOLOGY Lexine BatonI, Dwon Sky, personally viewed and evaluated these images (plain radiographs) as part of my medical decision making, as well as reviewing the written report by the radiologist.  DG chest Small focal opacity in the left lung  base.  ____________________________________________    PROCEDURES  Procedure(s) performed:    Procedures    Medications  acetaminophen (TYLENOL) tablet 650 mg (650 mg Oral Given 07/17/19 1007)  albuterol (VENTOLIN HFA) 108 (90 Base) MCG/ACT inhaler 1 puff (1 puff Inhalation Given 07/17/19 1007)     ____________________________________________   INITIAL IMPRESSION / ASSESSMENT AND PLAN / ED COURSE  Pertinent labs & imaging results that were available during my care of the patient were reviewed by me and considered in my medical decision making (see chart for details).  Review of the [place] CSRS was performed in accordance of the [facility] prior to dispensing any controlled drugs.     Patient's diagnosis is consistent with suspected Covid-19 and pneumonia. Vital signs and exam are reassuring.  Patient has suspected COVID-19.  Chest x-ray concerning for small opacity in the left lung base.  Lab work ordered.  Patient was given a small bolus of fluids for her tachycardia.  IV antibiotics started for pneumonia.  Care was transferred to Greig RightSusan Fisher pending lab work, IV fluids, IV antibiotics for disposition.  Bertram SavinBrandy M Gilmer was evaluated in Emergency Department  on 07/17/2019 for the symptoms described in the history of present illness. She was evaluated in the context of the global COVID-19 pandemic, which necessitated consideration that the patient might be at risk for infection with the SARS-CoV-2 virus that causes COVID-19. Institutional protocols and algorithms that pertain to the evaluation of patients at risk for COVID-19 are in a state of rapid change based on information released by regulatory bodies including the CDC and federal and state organizations. These policies and algorithms were followed during the patient's care in the ED.   ____________________________________________  FINAL CLINICAL IMPRESSION(S) / ED DIAGNOSES    NEW MEDICATIONS STARTED DURING THIS  VISIT:     This chart was dictated using voice recognition software/Dragon. Despite best efforts to proofread, errors can occur which can change the meaning. Any change was purely unintentional.    Enid Derry, PA-C 07/17/19 1526    Concha Se, MD 07/19/19 [phone]

## 2019-07-17 NOTE — ED Provider Notes (Signed)
Assumed care from [facility], PA-C.  Patient admits to Covid-like symptoms.  She states she has lost her sense of smell.  Cough and some shortness of breath.  Only complicating factor at this time will be whether or not she knows any blood clot in her left lower leg.  She recently had surgery on her foot in which there was anesthesia.  States area has been tender.  Therefore go ahead and order a ultrasound of the left lower extremity to rule out DVT.  Patient appears well.  She does not want to do a rapid Covid or blood work at this time.  Therefore we will do ultrasound of the left lower leg and treat her with Z-Pak and steroid pack upon discharge.   Physical Exam  BP 125/62   Pulse (!) 109   Temp 99.9 F (37.7 C) (Oral)   Ht 5\' 5"  (1.651 m)   Wt 93.4 kg   LMP 05/18/2016 (Approximate)   SpO2 98%   BMI 34.28 kg/m   Physical Exam  ED Course/Procedures    [place] is negative for dvt Procedures  MDM  Patient was placed on a Z-Pak, steroid pack.  She is to follow-up with her regular doctor.  Covid test still pending.       Versie Starks, PA-C 07/17/19 1304    Vanessa [place], MD 07/17/19 [phone]

## 2019-07-19 ENCOUNTER — Telehealth: Payer: Self-pay | Admitting: Emergency Medicine

## 2019-07-19 NOTE — Telephone Encounter (Signed)
Called patient to assure she is aware of coivid result positive.  She is aware.

## 2019-07-20 ENCOUNTER — Encounter: Payer: Self-pay | Admitting: Podiatry

## 2019-07-21 ENCOUNTER — Other Ambulatory Visit: Payer: Medicaid Other

## 2019-08-05 ENCOUNTER — Ambulatory Visit (INDEPENDENT_AMBULATORY_CARE_PROVIDER_SITE_OTHER): Payer: Self-pay | Admitting: Podiatry

## 2019-08-05 ENCOUNTER — Other Ambulatory Visit: Payer: Self-pay

## 2019-08-05 ENCOUNTER — Ambulatory Visit (INDEPENDENT_AMBULATORY_CARE_PROVIDER_SITE_OTHER): Payer: Medicaid Other

## 2019-08-05 ENCOUNTER — Encounter: Payer: Self-pay | Admitting: Podiatry

## 2019-08-05 DIAGNOSIS — Z9889 Other specified postprocedural states: Secondary | ICD-10-CM

## 2019-08-05 DIAGNOSIS — M2580 Other specified joint disorders, unspecified joint: Secondary | ICD-10-CM

## 2019-08-05 DIAGNOSIS — M216X2 Other acquired deformities of left foot: Secondary | ICD-10-CM

## 2019-08-08 ENCOUNTER — Encounter: Payer: Self-pay | Admitting: Podiatry

## 2019-08-08 NOTE — Progress Notes (Signed)
   Subjective:  Patient presents today status post fibular sesamoidectomy with 1st MPJ capsulotomy left. DOS: 06/17/2019. She reports a moderate amount of sharp pain to the distal toe. She also notes tightness and soreness to the left calf that began 1-2 weeks ago. She describes this calf pain as cramping. She has been wearing good shoes as directed. There are no aggravating factors noted. Patient is here for further evaluation and treatment.    Past Medical History:  Diagnosis Date  . Anemia   . Asthma    WELL CONTROLLED-LAST USED INHALER IN 2012  . Migraines    MIGRAINES  . Sickle cell trait (HCC)       Objective/Physical Exam Neurovascular status intact.  Skin incisions appear to be well coapted. No sign of infectious process noted. No dehiscence. No active bleeding noted. Moderate edema noted to the surgical extremity. Limited range of motion noted to the 1st MPJ of the left foot.   Radiographic Exam:  abscence of fibular sesmoid noted. Joint spaces preserved. No fracture or pathology identified.   Assessment: 1. s/p fibular sesamoidectomy with 1st MPJ capsulotomy left. DOS: 06/17/2019   Plan of Care:  1. Patient was evaluated. X-Rays reviewed.  2. Physical therapy ordered at [facility].  3. Recommended good sneakers.  4. Return to clinic in 6 weeks.    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    Felecia Shelling, DPM [facility]  Dr. Felecia Shelling, DPM    [address]                                        [place], [place] [address]                Office [phone]  Fax [phone]

## 2019-08-09 ENCOUNTER — Other Ambulatory Visit: Payer: Self-pay

## 2019-08-09 ENCOUNTER — Ambulatory Visit: Payer: Medicaid Other | Attending: Podiatry

## 2019-08-09 DIAGNOSIS — M6281 Muscle weakness (generalized): Secondary | ICD-10-CM | POA: Diagnosis present

## 2019-08-09 DIAGNOSIS — R262 Difficulty in walking, not elsewhere classified: Secondary | ICD-10-CM

## 2019-08-09 DIAGNOSIS — M25572 Pain in left ankle and joints of left foot: Secondary | ICD-10-CM | POA: Insufficient documentation

## 2019-08-09 NOTE — Patient Instructions (Signed)
Gave supine with L LE propped ankle pumps, IV/EV, ankle circles, and toe crunches and toe extension as part of her HEP. Pt demonstrated and verbalized understanding.

## 2019-08-09 NOTE — Therapy (Signed)
[facility] PHYSICAL AND SPORTS MEDICINE [address] [place], [address] Phone: [phone]   Fax:  [phone]  Physical Therapy Evaluation  Patient Details  Name: Danielle Pollard MRN: [id number] Date of Birth: 1985/08/29 Referring Provider (PT): Gala Lewandowsky, DPM   Encounter Date: 08/09/2019  PT End of Session - 08/09/19 1350    Visit Number  1    Number of Visits  4    Date for PT Re-Evaluation  09/22/19    Authorization Type  1    Authorization Time Period  of 4 Medicaid    PT Start Time  1351    PT Stop Time  1442    PT Time Calculation (min)  51 min    Activity Tolerance  Patient tolerated treatment well    Behavior During Therapy  [facility] for tasks assessed/performed       Past Medical History:  Diagnosis Date  . Anemia   . Asthma    WELL CONTROLLED-LAST USED INHALER IN 2012  . Migraines    MIGRAINES  . Sickle cell trait [facility])     Past Surgical History:  Procedure Laterality Date  . ABDOMINAL HYSTERECTOMY    . CESAREAN SECTION    . DIAGNOSTIC LAPAROSCOPY  2012   Dr. Luella Cook; lysis of adhesions  . LAPAROSCOPIC HYSTERECTOMY Bilateral 01/05/2018   Procedure: HYSTERECTOMY TOTAL LAPAROSCOPIC, BILATERAL SALPINGECTOMY;  Surgeon: Nadara Mustard, MD;  Location: [facility] ORS;  Service: Gynecology;  Laterality: Bilateral;  . LAPAROSCOPY N/A 01/05/2018   Procedure: LAPAROSCOPY DIAGNOSTIC;  Surgeon: Nadara Mustard, MD;  Location: [facility] ORS;  Service: Gynecology;  Laterality: N/A;    There were no vitals filed for this visit.   Subjective Assessment - 08/09/19 1356    Subjective  L great toe 7/10 currently. 10/10 (at night) at worst for the past 2 weeks. 7/10 at best for the past 2 week.    Pertinent History  S/P L sesamoidectomy/1st capsulotomy on 06/17/2019. Has not yet had PT for her procedure. Pt was told to stay off her foot as much as possible for the 6 weeks of therapy. The area where the surgeon removed the bone is still  swollen. Can be on her foot with the orthopedic shoe. Pt states that she cannot actively move her R great toe. She can move it with her hand. The swelling has gone down around her leg. The area around the great toe is still swollen.  Tips of all toes feel like it is pinching at night. L foot bothers her when she walks. Doctor said that the pain in the back of her L gastroc is due to her putting weight on her L LE. Pt is a single parent with a son.    Patient Stated Goals  Walk and able to move her great toe again.    Currently in Pain?  Yes    Pain Score  7     Pain Location  Foot    Pain Orientation  Left    Pain Descriptors / Indicators  Aching;Sore    Pain Type  Chronic pain    Pain Frequency  Constant    Aggravating Factors   walking, nighttime    Pain Relieving Factors  Extra Strength Tylenol, pain medication         [facility] PT Assessment - 08/09/19 1408      Assessment   Medical Diagnosis  S/P L sesamoidectomy/1st capsulotomy    Referring Provider (PT)  Gala Lewandowsky,  DPM    Onset Date/Surgical Date  06/17/19    Prior Therapy  No known PT for current condition      Precautions   Precaution Comments  Pt to wear orthopedic shoe      Restrictions   Other Position/Activity Restrictions  Pt to wear orthopedic shoe      Home Environment   Additional Comments  Pt lives with her child      Prior Function   Vocation  Full time employment   CNA   Vocation Requirements  PLOF: no difficulty walking, performing transfers at work      Observation/Other Assessments   Observations  swelling L forefoot, surgical incisions healed.     Focus on Therapeutic Outcomes (FOTO)   L foot FOTO 28      AROM   Overall AROM Comments  1/5 L great toe flexion and extension     Left Ankle Dorsiflexion  2    Left Ankle Plantar Flexion  55      Strength   Overall Strength Comments  1/5 L great toe flexion and extension     Right Hip Flexion  4+/5    Right Hip Extension  4-/5    Right Hip ABduction   4-/5    Left Hip Flexion  5/5    Left Hip Extension  4/5    Left Hip ABduction  4/5    Right Knee Flexion  5/5    Right Knee Extension  5/5      Palpation   Palpation comment  TTP and surgical incisions. Decrease scar tissue mobility      Ambulation/Gait   Gait Comments  Pt wearing orthopedic shoe on L. Antalgic, slight decreased stance L LE                Objective measurements completed on examination: See above findings.   Pt currently ambulates with orthopedic shoe with weight on her L lateral foot and heel to decrease pressure on her medial foot. Antalgic, slight decreased stance L LE  No abnormal redness, swelling, discoloration, or warmth L leg compared to R. Pain with L calf squeeze. Pt states US on L leg 07/17/19 was negative for DVT.   Manual therapy Gentle STM dorsal scar tissue supine effleurage L foot to decrease swelling with LE propped  Slight decreased L foot and ankle swelling observed.   Pt is a 34 year old female who came to physical therapy S/P L fibular sesamoidectomy with 1st MPJ capsulotomy on 06/17/2019. She also presents with L foot swelling, TTP at surgical incision, L great toe weakness/limited AROM, L hip weakness, altered gait pattern, decreased scar tissue mobility, and difficulty performing functional tasks such as walking. Pt will benefit from skilled physical therapy services to address the aforementioned deficits.        PT Education - 08/09/19 1458    Education Details  HEP, plan of care    Person(s) Educated  Patient    Methods  Explanation;Demonstration;Tactile cues;Verbal cues    Comprehension  Returned demonstration;Verbalized understanding       PT Short Term Goals - 08/09/19 1500      PT SHORT TERM GOAL #1   Title  Pt will be independent with her HEP to decrease swelling, improve strength and ability to ambulate.    Baseline  Pt started her HEP (08/09/2019)    Time  3    Period  Weeks    Status  New    Target Date  09/01/19        PT Long Term Goals - 08/09/19 1501      PT LONG TERM GOAL #1   Title  Patient will improve bilateral hip extension and abduction strength by 1/2 MMT grade to promote better mechanics at her L foot to promote ability to ambulate with less difficulty.    Baseline  Hip extension 4-/5 R, 4/5 L; hip abduction 4-/5 R, 4/5 L (08/09/2019)    Time  6    Period  Weeks    Status  New    Target Date  09/22/19      PT LONG TERM GOAL #2   Title  Pt will improve L great toe flexion and extension strength by at least 1/2 MMT grade to promote ability to ambulate with less difficulty.    Baseline  1/5 L great toe flexion and extension strength (08/09/2019)    Time  6    Period  Weeks    Status  New    Target Date  09/22/19      PT LONG TERM GOAL #3   Title  Pt will improve L foot FOTO score by at least 20 points as a demonstration of improved function.    Baseline  L foot FOTO: 28 (08/09/2019)    Time  6    Period  Weeks    Status  New    Target Date  09/22/19             Plan - 08/09/19 1456    Clinical Impression Statement  Pt is a 34 year old female who came to physical therapy S/P L fibular sesamoidectomy with 1st MPJ capsulotomy on 06/17/2019. She also presents with L foot swelling, TTP at surgical incision, L great toe weakness/limited AROM, L hip weakness, altered gait pattern, decreased scar tissue mobility, and difficulty performing functional tasks such as walking. Pt will benefit from skilled physical therapy services to address the aforementioned deficits.    Personal Factors and Comorbidities  Time since onset of injury/illness/exacerbation;Profession;Finances    Examination-Activity Limitations  Lift;Squat;Stairs;Stand    Stability/Clinical Decision Making  Stable/Uncomplicated    Clinical Decision Making  Low    Rehab Potential  Fair    PT Frequency  2x / week    PT Duration  6 weeks    PT Treatment/Interventions  Therapeutic exercise;Balance  training;Neuromuscular re-education;Patient/family education;Manual techniques;Scar mobilization;Passive range of motion;Dry needling;Aquatic Therapy;Electrical Stimulation;Iontophoresis 4mg /ml Dexamethasone;Ultrasound;Gait training;Stair training;Functional mobility training;Therapeutic activities    PT Next Visit Plan  hip, knee, ankle strengthening/ROM, manual techniques, modalities PRN    PT Home Exercise Plan  ankle pumps, circles, EV/IV, toe flexion/extension    Consulted and Agree with Plan of Care  Patient       Patient will benefit from skilled therapeutic intervention in order to improve the following deficits and impairments:     Visit Diagnosis: Pain in left ankle and joints of left foot - Plan: PT plan of care cert/re-cert  Muscle weakness (generalized) - Plan: PT plan of care cert/re-cert  Difficulty in walking, not elsewhere classified - Plan: PT plan of care cert/re-cert     Problem List Patient Active Problem List   Diagnosis Date Noted  . Migraine 05/10/2014  . Headache 04/18/2014  . Obesity, unspecified 04/18/2014    04/20/2014 PT, DPT    08/09/2019, 3:22 PM  [facility]ICAL AND SPORTS MEDICINE [address], [place] Phone: [phone]   Fax:  [phone]  Name: PAT ELICKER MRN: [id number] Date of Birth: July 29, 1985

## 2019-08-12 ENCOUNTER — Other Ambulatory Visit: Payer: Self-pay | Admitting: Family Medicine

## 2019-08-12 DIAGNOSIS — N6489 Other specified disorders of breast: Secondary | ICD-10-CM

## 2019-08-16 ENCOUNTER — Ambulatory Visit: Payer: Medicaid Other

## 2019-08-16 ENCOUNTER — Other Ambulatory Visit: Payer: Self-pay

## 2019-08-16 DIAGNOSIS — M6281 Muscle weakness (generalized): Secondary | ICD-10-CM

## 2019-08-16 DIAGNOSIS — R262 Difficulty in walking, not elsewhere classified: Secondary | ICD-10-CM

## 2019-08-16 DIAGNOSIS — M25572 Pain in left ankle and joints of left foot: Secondary | ICD-10-CM

## 2019-08-16 NOTE — Therapy (Signed)
[place] [facility]INE [address] [place], [address] Phone: [phone]   Fax:  [phone]  Physical Therapy Treatment  Patient Details  Name: Danielle Pollard MRN: [id number] Date of Birth: 14-Oct-1985 Referring Provider (PT): Gala Lewandowsky, DPM   Encounter Date: 08/16/2019  PT End of Session - 08/16/19 1350    Visit Number  2    Number of Visits  4    Date for PT Re-Evaluation  09/22/19    Authorization Type  2    Authorization Time Period  of 4 Medicaid    PT Start Time  1350    PT Stop Time  1432    PT Time Calculation (min)  42 min    Activity Tolerance  Patient tolerated treatment well    Behavior During Therapy  [facility] for tasks assessed/performed       Past Medical History:  Diagnosis Date  . Anemia   . Asthma    WELL CONTROLLED-LAST USED INHALER IN 2012  . Migraines    MIGRAINES  . Sickle cell trait [facility])     Past Surgical History:  Procedure Laterality Date  . ABDOMINAL HYSTERECTOMY    . CESAREAN SECTION    . DIAGNOSTIC LAPAROSCOPY  2012   Dr. Luella Cook; lysis of adhesions  . LAPAROSCOPIC HYSTERECTOMY Bilateral 01/05/2018   Procedure: HYSTERECTOMY TOTAL LAPAROSCOPIC, BILATERAL SALPINGECTOMY;  Surgeon: Nadara Mustard, MD;  Location: [facility] ORS;  Service: Gynecology;  Laterality: Bilateral;  . LAPAROSCOPY N/A 01/05/2018   Procedure: LAPAROSCOPY DIAGNOSTIC;  Surgeon: Nadara Mustard, MD;  Location: [facility] ORS;  Service: Gynecology;  Laterality: N/A;    There were no vitals filed for this visit.  Subjective Assessment - 08/16/19 1351    Subjective  L foot is the same. I feels like a sharp pain shooting at the top incision of the big toe. 10/10 pain currently, feels like a needle stuck in her. It started a couple of days ago.    Pertinent History  S/P L sesamoidectomy/1st capsulotomy on 06/17/2019. Has not yet had PT for her procedure. Pt was told to stay off her foot as much as possible for the 6  weeks of therapy. The area where the surgeon removed the bone is still swollen. Can be on her foot with the orthopedic shoe. Pt states that she cannot actively move her R great toe. She can move it with her hand. The swelling has gone down around her leg. The area around the great toe is still swollen.  Tips of all toes feel like it is pinching at night. L foot bothers her when she walks. Doctor said that the pain in the back of her L gastroc is due to her putting weight on her L LE. Pt is a single parent with a son.    Patient Stated Goals  Walk and able to move her great toe again.    Currently in Pain?  Yes    Pain Score  10-Worst pain ever                               PT Education - 08/16/19 1715    Education Details  ther-ex    Person(s) Educated  Patient    Methods  Explanation;Demonstration;Tactile cues;Verbal cues    Comprehension  Returned demonstration;Verbalized understanding      Objective  Pt currently ambulates with orthopedic shoe with weight on her L  lateral foot and heel to decrease pressure on her medial foot. Antalgic, slight decreased stance L LE No abnormal redness, swelling, discoloration, or warmth L leg compared to R. Pain with L calf squeeze. Pt states US on L leg 07/17/19 was negative for DVT.    Manual therapy  Gentle STM dorsal scar tissue and plantar scar tissue  supine effleurage L foot to decrease swelling with LE propped             Decreased L foot and ankle swelling observed.   Supine STM L tibialis anterior to decrease tension and dorsolateral foot discomfort with PF    Therapeutic exercise  Supine ankle DF/PF 10x3 AROM EV/IV 10x3 AROM Ankle circles clockwise and counterclockwise 10x3 each  Gentle manually resisted ankle DF 10x3   ankle PF 10x3   Ankle EV 10x3   Ankle IV 10x3  Supine toe curls 10x Supine toe extension 10x Supine assisted toe flexion and extension great toe 10x and abduction/adduction great toe  10x   R S/L L hip abduction 10x2  Good glute med muscle use felt   Improved exercise technique, movement at target joints, use of target muscles after min to mod verbal, visual, tactile cues.    Response to treatment Good muscle use felt with exercises. Decreased dorsolateral foot pain with plantar flexion with treatment to decrease tibialis anterior muscle tension to superficial peroneal nerve. Decreased L foot swelling after effleurage followed by ankle AROM. Decreased L foot pain to 5/10 after session   Clinical impression Decreased L foot swelling observed compared to previous session. Pt also able to perform L great toe flexion and able to move her great toe to extension slightly today. Continued working on soft tissue techniques to decrease swelling and soft tissue restrictions as well as AROM to decrease stiffness and improve ankle strength. Pt tolerated session well without aggravation of symptoms. Pt will benefit from continued skilled physical therapy services to decrease pain, swelling, improve ROM, strength, function, and decrease difficulty with gait.         PT Short Term Goals - 08/09/19 1500      PT SHORT TERM GOAL #1   Title  Pt will be independent with her HEP to decrease swelling, improve strength and ability to ambulate.    Baseline  Pt started her HEP (08/09/2019)    Time  3    Period  Weeks    Status  New    Target Date  09/01/19        PT Long Term Goals - 08/09/19 1501      PT LONG TERM GOAL #1   Title  Patient will improve bilateral hip extension and abduction strength by 1/2 MMT grade to promote better mechanics at her L foot to promote ability to ambulate with less difficulty.    Baseline  Hip extension 4-/5 R, 4/5 L; hip abduction 4-/5 R, 4/5 L (08/09/2019)    Time  6    Period  Weeks    Status  New    Target Date  09/22/19      PT LONG TERM GOAL #2   Title  Pt will improve L great toe flexion and extension strength by at least 1/2 MMT grade to  promote ability to ambulate with less difficulty.    Baseline  1/5 L great toe flexion and extension strength (08/09/2019)    Time  6    Period  Weeks    Status  New    Target  Date  09/22/19      PT LONG TERM GOAL #3   Title  Pt will improve L foot FOTO score by at least 20 points as a demonstration of improved function.    Baseline  L foot FOTO: 28 (08/09/2019)    Time  6    Period  Weeks    Status  New    Target Date  09/22/19            Plan - 08/16/19 1715    Clinical Impression Statement  Decreased L foot swelling observed compared to previous session. Pt also able to perform L great toe flexion and able to move her great toe to extension slightly today. Continued working on soft tissue techniques to decrease swelling and soft tissue restrictions as well as AROM to decrease stiffness and improve ankle strength. Pt tolerated session well without aggravation of symptoms. Pt will benefit from continued skilled physical therapy services to decrease pain, swelling, improve ROM, strength, function, and decrease difficulty with gait.    Personal Factors and Comorbidities  Time since onset of injury/illness/exacerbation;Profession;Finances    Examination-Activity Limitations  Lift;Squat;Stairs;Stand    Stability/Clinical Decision Making  Stable/Uncomplicated    Rehab Potential  Fair    PT Frequency  2x / week    PT Duration  6 weeks    PT Treatment/Interventions  Therapeutic exercise;Balance training;Neuromuscular re-education;Patient/family education;Manual techniques;Scar mobilization;Passive range of motion;Dry needling;Aquatic Therapy;Electrical Stimulation;Iontophoresis 4mg /ml Dexamethasone;Ultrasound;Gait training;Stair training;Functional mobility training;Therapeutic activities    PT Next Visit Plan  hip, knee, ankle strengthening/ROM, manual techniques, modalities PRN    PT Home Exercise Plan  ankle pumps, circles, EV/IV, toe flexion/extension    Consulted and Agree with Plan of  Care  Patient       Patient will benefit from skilled therapeutic intervention in order to improve the following deficits and impairments:     Visit Diagnosis: Pain in left ankle and joints of left foot  Muscle weakness (generalized)  Difficulty in walking, not elsewhere classified     Problem List Patient Active Problem List   Diagnosis Date Noted  . Migraine 05/10/2014  . Headache 04/18/2014  . Obesity, unspecified 04/18/2014    04/20/2014 PT, DPT   08/16/2019, 5:25 PM  [facility] PHYSICAL AND SPORTS MEDICINE [address], [place] Phone: [phone]   Fax:  [phone]  Name: JANNETTE COTHAM MRN: [name] Date of Birth: 17-Jul-1986

## 2019-08-18 ENCOUNTER — Ambulatory Visit: Payer: Medicaid Other

## 2019-08-23 ENCOUNTER — Ambulatory Visit: Payer: Medicaid Other

## 2019-08-23 ENCOUNTER — Other Ambulatory Visit: Payer: Self-pay

## 2019-08-23 DIAGNOSIS — M6281 Muscle weakness (generalized): Secondary | ICD-10-CM

## 2019-08-23 DIAGNOSIS — M25572 Pain in left ankle and joints of left foot: Secondary | ICD-10-CM

## 2019-08-23 DIAGNOSIS — R262 Difficulty in walking, not elsewhere classified: Secondary | ICD-10-CM

## 2019-08-23 NOTE — Therapy (Signed)
[place] PHYSICAL AND SPORTS MEDICINE [address] [place], [address] Phone: [phone]   Fax:  [phone]  Physical Therapy Treatment  Patient Details  Name: Danielle Pollard MRN: [id number] Date of Birth: 11/01/1985 Referring Provider (PT): Daylene Katayama, DPM   Encounter Date: 08/23/2019  PT End of Session - 08/23/19 1437    Visit Number  3    Number of Visits  4    Date for PT Re-Evaluation  09/22/19    Authorization Type  3    Authorization Time Period  of 4 Medicaid    PT Start Time  9357    PT Stop Time  1521    PT Time Calculation (min)  44 min    Activity Tolerance  Patient tolerated treatment well    Behavior During Therapy  [facility] for tasks assessed/performed       Past Medical History:  Diagnosis Date  . Anemia   . Asthma    WELL CONTROLLED-LAST USED INHALER IN 2012  . Migraines    MIGRAINES  . Sickle cell trait [facility])     Past Surgical History:  Procedure Laterality Date  . ABDOMINAL HYSTERECTOMY    . CESAREAN SECTION    . DIAGNOSTIC LAPAROSCOPY  2012   Dr. Laurey Morale; lysis of adhesions  . LAPAROSCOPIC HYSTERECTOMY Bilateral 01/05/2018   Procedure: HYSTERECTOMY TOTAL LAPAROSCOPIC, BILATERAL SALPINGECTOMY;  Surgeon: Gae Dry, MD;  Location: [facility];  Service: Gynecology;  Laterality: Bilateral;  . LAPAROSCOPY N/A 01/05/2018   Procedure: LAPAROSCOPY DIAGNOSTIC;  Surgeon: Gae Dry, MD;  Location: [facility];  Service: Gynecology;  Laterality: N/A;    There were no vitals filed for this visit.  Subjective Assessment - 08/23/19 1439    Subjective  L foot is the same. Just a lot of tingling. About 6/10 pain at the great toe. Does the exercises and ice packs regularly at home.    Pertinent History  S/P L sesamoidectomy/1st capsulotomy on 06/17/2019. Has not yet had PT for her procedure. Pt was told to stay off her foot as much as possible for the 6 weeks of therapy. The area where the surgeon removed  the bone is still swollen. Can be on her foot with the orthopedic shoe. Pt states that she cannot actively move her R great toe. She can move it with her hand. The swelling has gone down around her leg. The area around the great toe is still swollen.  Tips of all toes feel like it is pinching at night. L foot bothers her when she walks. Doctor said that the pain in the back of her L gastroc is due to her putting weight on her L LE. Pt is a single parent with a son.    Patient Stated Goals  Walk and able to move her great toe again.    Currently in Pain?  Yes    Pain Score  6                                PT Education - 08/23/19 1445    Education Details  ther-ex    Person(s) Educated  Patient    Methods  Explanation;Demonstration;Tactile cues;Verbal cues    Comprehension  Returned demonstration;Verbalized understanding      Objective  Pt currently ambulates with orthopedic shoe with weight on her L lateral foot and heel to decrease pressure on her medial foot. Antalgic,  slight decreased stance L LE No abnormal redness, swelling, discoloration, or warmth L leg compared to R. Pain with L calf squeeze. Pt states US on L leg 07/17/19 was negative for DVT.    Manual therapy   Gentle STM dorsal scar tissue and plantar scar tissue  Prone with L knee bent, effleurage L foot to decrease swelling Decreased L foot and ankle swelling observed.     Therapeutic exercise  Manually resisted prone hip extension, S/L hip abduction   Hip extension: R 4/5, L 4/5; hip abduction R 4+/5, L 4+/5  Prone glute max extension   L 10x2  R 10x2  S/L hip abduction   R 10x3  L 10x3  Supine MTP flexion and extension PROM 10x3 Supine L great toe abduction and adduction 10x3 PROM  Supine MTP flexion and extension AAROM with PT 10x3  Supine L great toe and digit 2 finger squeeze 10x3   Improved exercise technique, movement at target joints, use of target  muscles after min to mod verbal, visual, tactile cues.    Response to treatment Pt tolerated session well without aggravation of symptoms.   Clinical impression   Pt demonstrates improved overall glute max and med strength, decreased L foot swelling observed compared to initial visit, and improved ability to curl her toes. Pt still demonstrates L foot swelling, tenderness at surgical area, pain, weakness, and difficulty walking and would benefit from continued skilled physical therapy services to address the aforementioned deficits.      PT Short Term Goals - 08/23/19 1536      PT SHORT TERM GOAL #1   Title  Pt will be independent with her HEP to decrease swelling, improve strength and ability to ambulate.    Baseline  Pt started her HEP (08/09/2019); Pt performing her HEP, more exercises added periodically (08/23/2019)    Time  3    Period  Weeks    Status  Partially Met    Target Date  09/01/19        PT Long Term Goals - 08/23/19 1451      PT LONG TERM GOAL #1   Title  Patient will improve bilateral hip extension and abduction strength by 1/2 MMT grade to promote better mechanics at her L foot to promote ability to ambulate with less difficulty.    Baseline  Hip extension 4-/5 R, 4/5 L; hip abduction 4-/5 R, 4/5 L (08/09/2019); hip extension 4/5 R and L, hip abduction 4+/5 R and L (08/23/2019)    Time  6    Period  Weeks    Status  New    Target Date  09/22/19      PT LONG TERM GOAL #2   Title  Pt will improve L great toe flexion and extension strength by at least 1/2 MMT grade to promote ability to ambulate with less difficulty.    Baseline  1/5 L great toe flexion and extension strength (08/09/2019); 1/5 extension, 2-/5 flexion (08/23/2019)    Time  6    Period  Weeks    Status  On-going    Target Date  09/22/19      PT LONG TERM GOAL #3   Title  Pt will improve L foot FOTO score by at least 20 points as a demonstration of improved function.    Baseline  L foot FOTO:  28 (08/09/2019); 22 (08/23/2019)    Time  6    Period  Weeks    Status  On-going  Target Date  09/22/19            Plan - 08/23/19 1446    Clinical Impression Statement  Pt demonstrates improved overall glute max and med strength, decreased L foot swelling observed compared to initial visit, and improved ability to curl her toes. Pt still demonstrates L foot swelling, tenderness at surgical area, pain, weakness, and difficulty walking and would benefit from continued skilled physical therapy services to address the aforementioned deficits.    Personal Factors and Comorbidities  Time since onset of injury/illness/exacerbation;Profession;Finances    Examination-Activity Limitations  Lift;Squat;Stairs;Stand    Stability/Clinical Decision Making  Stable/Uncomplicated    Rehab Potential  Fair    PT Frequency  2x / week    PT Duration  6 weeks    PT Treatment/Interventions  Therapeutic exercise;Balance training;Neuromuscular re-education;Patient/family education;Manual techniques;Scar mobilization;Passive range of motion;Dry needling;Aquatic Therapy;Electrical Stimulation;Iontophoresis 52m/ml Dexamethasone;Ultrasound;Gait training;Stair training;Functional mobility training;Therapeutic activities    PT Next Visit Plan  hip, knee, ankle strengthening/ROM, manual techniques, modalities PRN    PT Home Exercise Plan  ankle pumps, circles, EV/IV, toe flexion/extension    Consulted and Agree with Plan of Care  Patient       Patient will benefit from skilled therapeutic intervention in order to improve the following deficits and impairments:     Visit Diagnosis: Pain in left ankle and joints of left foot  Muscle weakness (generalized)  Difficulty in walking, not elsewhere classified     Problem List Patient Active Problem List   Diagnosis Date Noted  . Migraine 05/10/2014  . Headache 04/18/2014  . Obesity, unspecified 04/18/2014   MJoneen BoersPT, DPT   08/23/2019, 3:53 PM  [facility] [address] [place] [address]Phone: [phone]  Fax:  [phone] Name: BRETHEL SEBEKMRN: [id number] of Birth: 9May 27, 1987

## 2019-08-25 ENCOUNTER — Ambulatory Visit: Payer: Medicaid Other

## 2019-08-25 ENCOUNTER — Other Ambulatory Visit: Payer: Self-pay

## 2019-08-25 DIAGNOSIS — M25572 Pain in left ankle and joints of left foot: Secondary | ICD-10-CM | POA: Diagnosis not present

## 2019-08-25 DIAGNOSIS — M6281 Muscle weakness (generalized): Secondary | ICD-10-CM

## 2019-08-25 DIAGNOSIS — R262 Difficulty in walking, not elsewhere classified: Secondary | ICD-10-CM

## 2019-08-25 NOTE — Therapy (Signed)
[place] [facility] [address], [place], [address] Phone: [phone]   Fax:  [phone]  Physical Therapy Treatment  Patient Details  Name: Danielle Pollard MRN: [id number] Date of Birth: 1985-12-28 Referring Provider (PT): Daylene Katayama, DPM   Encounter Date: 08/25/2019  PT End of Session - 08/25/19 1436    Visit Number  4    Number of Visits  4    Date for PT Re-Evaluation  09/22/19    Authorization Type  4    Authorization Time Period  of 4 Medicaid    PT Start Time  7915    PT Stop Time  1517    PT Time Calculation (min)  40 min    Activity Tolerance  Patient tolerated treatment well    Behavior During Therapy  [facility] for tasks assessed/performed       Past Medical History:  Diagnosis Date  . Anemia   . Asthma    WELL CONTROLLED-LAST USED INHALER IN 2012  . Migraines    MIGRAINES  . Sickle cell trait [facility])     Past Surgical History:  Procedure Laterality Date  . ABDOMINAL HYSTERECTOMY    . CESAREAN SECTION    . DIAGNOSTIC LAPAROSCOPY  2012   Dr. Laurey Morale; lysis of adhesions  . LAPAROSCOPIC HYSTERECTOMY Bilateral 01/05/2018   Procedure: HYSTERECTOMY TOTAL LAPAROSCOPIC, BILATERAL SALPINGECTOMY;  Surgeon: Gae Dry, MD;  Location: [facility] ORS;  Service: Gynecology;  Laterality: Bilateral;  . LAPAROSCOPY N/A 01/05/2018   Procedure: LAPAROSCOPY DIAGNOSTIC;  Surgeon: Gae Dry, MD;  Location: [facility] ORS;  Service: Gynecology;  Laterality: N/A;    There were no vitals filed for this visit.  Subjective Assessment - 08/25/19 1438    Subjective  L foot is good. Feels the same. Feels the sharp pain at night at the incision. 5/10 currently.    Pertinent History  S/P L sesamoidectomy/1st capsulotomy on 06/17/2019. Has not yet had PT for her procedure. Pt was told to stay off her foot as much as possible for the 6 weeks of therapy. The area where the surgeon removed the bone is still swollen. Can be on  her foot with the orthopedic shoe. Pt states that she cannot actively move her R great toe. She can move it with her hand. The swelling has gone down around her leg. The area around the great toe is still swollen.  Tips of all toes feel like it is pinching at night. L foot bothers her when she walks. Doctor said that the pain in the back of her L gastroc is due to her putting weight on her L LE. Pt is a single parent with a son.    Patient Stated Goals  Walk and able to move her great toe again.    Currently in Pain?  Yes    Pain Score  5                                PT Education - 08/25/19 1439    Education Details  ther-ex    Person(s) Educated  Patient    Methods  Explanation;Demonstration;Tactile cues;Verbal cues    Comprehension  Returned demonstration;Verbalized understanding      Objective  Pt currently ambulates with orthopedic shoe with weight on her L lateral foot and heel to decrease pressure on her medial foot. Antalgic, slight decreased stance L LE No abnormal redness,  swelling, discoloration, or warmth L leg compared to R. Pain with L calf squeeze. Pt states US on L leg 07/17/19 was negative for DVT.     Manual therapy  Gentle STM dorsal scar tissue  and plantar scar tissue  Supine with L foot propped,  effleurage L foot to decrease swelling Decreased L foot and ankle swelling observed.   Supine STM dorsal foot to decrease fascial restrictions    Therapeutic exercise  Supine MTP flexion and extension PROM 10x3 Supine L great toe abduction and adduction 10x3 PROM   Supine MTP flexion and extension AAROM with PT 10x3 Supine MTP abduction and adductino AAROM with PT 10x3   Supine L great toe and digit 2 finger squeeze 10x3 with 5 second holds    Improved exercise technique, movement at target joints, use of target muscles aftermin tomod verbal, visual, tactile cues.    Response to treatment Pt  tolerated session well without aggravation of symptoms.   Clinical impression  Improving abilty to perform L great toe flexion and extension as well as adduction. Decreasing L foot swelling observed. Improved scar tissue mobility after manual therapy. No L foot pain after session. Pt will benefit from continued skilled physical therapy services to decrease swelling, pain, improve soft tissue mobility, L foot strength, function, and decrease difficulty with gait.      PT Short Term Goals - 08/23/19 1536      PT SHORT TERM GOAL #1   Title  Pt will be independent with her HEP to decrease swelling, improve strength and ability to ambulate.    Baseline  Pt started her HEP (08/09/2019); Pt performing her HEP, more exercises added periodically (08/23/2019)    Time  3    Period  Weeks    Status  Partially Met    Target Date  09/01/19        PT Long Term Goals - 08/23/19 1451      PT LONG TERM GOAL #1   Title  Patient will improve bilateral hip extension and abduction strength by 1/2 MMT grade to promote better mechanics at her L foot to promote ability to ambulate with less difficulty.    Baseline  Hip extension 4-/5 R, 4/5 L; hip abduction 4-/5 R, 4/5 L (08/09/2019); hip extension 4/5 R and L, hip abduction 4+/5 R and L (08/23/2019)    Time  6    Period  Weeks    Status  New    Target Date  09/22/19      PT LONG TERM GOAL #2   Title  Pt will improve L great toe flexion and extension strength by at least 1/2 MMT grade to promote ability to ambulate with less difficulty.    Baseline  1/5 L great toe flexion and extension strength (08/09/2019); 1/5 extension, 2-/5 flexion (08/23/2019)    Time  6    Period  Weeks    Status  On-going    Target Date  09/22/19      PT LONG TERM GOAL #3   Title  Pt will improve L foot FOTO score by at least 20 points as a demonstration of improved function.    Baseline  L foot FOTO: 28 (08/09/2019); 22 (08/23/2019)    Time  6    Period  Weeks    Status   On-going    Target Date  09/22/19            Plan - 08/25/19 1710    Clinical Impression  Statement  Improving abilty to perform L great toe flexion and extension as well as adduction. Decreasing L foot swelling observed. Improved scar tissue mobility after manual therapy. No L foot pain after session. Pt will benefit from continued skilled physical therapy services to decrease swelling, pain, improve soft tissue mobility, L foot strength, function, and decrease difficulty with gait.    Personal Factors and Comorbidities  Time since onset of injury/illness/exacerbation;Profession;Finances    Examination-Activity Limitations  Lift;Squat;Stairs;Stand    Stability/Clinical Decision Making  Stable/Uncomplicated    Rehab Potential  Fair    PT Frequency  2x / week    PT Duration  6 weeks    PT Treatment/Interventions  Therapeutic exercise;Balance training;Neuromuscular re-education;Patient/family education;Manual techniques;Scar mobilization;Passive range of motion;Dry needling;Aquatic Therapy;Electrical Stimulation;Iontophoresis 37m/ml Dexamethasone;Ultrasound;Gait training;Stair training;Functional mobility training;Therapeutic activities    PT Next Visit Plan  hip, knee, ankle strengthening/ROM, manual techniques, modalities PRN    PT Home Exercise Plan  ankle pumps, circles, EV/IV, toe flexion/extension    Consulted and Agree with Plan of Care  Patient       Patient will benefit from skilled therapeutic intervention in order to improve the following deficits and impairments:     Visit Diagnosis: Pain in left ankle and joints of left foot  Muscle weakness (generalized)  Difficulty in walking, not elsewhere classified     Problem List Patient Active Problem List   Diagnosis Date Noted  . Migraine 05/10/2014  . Headache 04/18/2014  . Obesity, unspecified 04/18/2014    MJoneen BoersPT, DPT   08/25/2019, 5:15 PM  [place] [facility] [address] [place] [address]Phone: [phone]  Fax:  [phone] Name: Danielle SINAGRAMRN: [id number] of Birth: 9Sep 23, 1987

## 2019-08-26 ENCOUNTER — Ambulatory Visit
Admission: RE | Admit: 2019-08-26 | Discharge: 2019-08-26 | Disposition: A | Discharge status: Home / Self Care | Payer: Medicaid Other | Source: Ambulatory Visit | Attending: Family Medicine | Admitting: Family Medicine

## 2019-08-26 DIAGNOSIS — N6489 Other specified disorders of breast: Secondary | ICD-10-CM

## 2019-08-31 ENCOUNTER — Other Ambulatory Visit: Payer: Self-pay

## 2019-08-31 ENCOUNTER — Ambulatory Visit: Payer: Medicaid Other | Attending: Podiatry

## 2019-08-31 DIAGNOSIS — M25572 Pain in left ankle and joints of left foot: Secondary | ICD-10-CM | POA: Insufficient documentation

## 2019-08-31 DIAGNOSIS — M6281 Muscle weakness (generalized): Secondary | ICD-10-CM | POA: Insufficient documentation

## 2019-08-31 DIAGNOSIS — R262 Difficulty in walking, not elsewhere classified: Secondary | ICD-10-CM | POA: Diagnosis present

## 2019-08-31 NOTE — Therapy (Signed)
[place] [facility] [address] [place], [address] Phone: [phone]   Fax:  [phone]  Physical Therapy Treatment  Patient Details  Name: Danielle Pollard MRN: [id number] Date of Birth: [birth year] Referring Provider (PT): Daylene Katayama, DPM   Encounter Date: 08/31/2019  PT End of Session - 08/31/19 1520    Visit Number  5    Number of Visits  16    Date for PT Re-Evaluation  09/22/19    Authorization Type  5    Authorization Time Period  of  16 Medicaid    PT Start Time  6546    PT Stop Time  1600    PT Time Calculation (min)  39 min    Activity Tolerance  Patient tolerated treatment well    Behavior During Therapy  [facility] for tasks assessed/performed       Past Medical History:  Diagnosis Date  . Anemia   . Asthma    WELL CONTROLLED-LAST USED INHALER IN 2012  . Migraines    MIGRAINES  . Sickle cell trait [facility])     Past Surgical History:  Procedure Laterality Date  . ABDOMINAL HYSTERECTOMY    . CESAREAN SECTION    . DIAGNOSTIC LAPAROSCOPY  2012   Dr. Laurey Morale; lysis of adhesions  . LAPAROSCOPIC HYSTERECTOMY Bilateral 01/05/2018   Procedure: HYSTERECTOMY TOTAL LAPAROSCOPIC, BILATERAL SALPINGECTOMY;  Surgeon: Gae Dry, MD;  Location: [facility];  Service: Gynecology;  Laterality: Bilateral;  . LAPAROSCOPY N/A 01/05/2018   Procedure: LAPAROSCOPY DIAGNOSTIC;  Surgeon: Gae Dry, MD;  Location: [facility];  Service: Gynecology;  Laterality: N/A;    There were no vitals filed for this visit.  Subjective Assessment - 08/31/19 1522    Subjective  L foot is ok. Had pain in the great toe earlier in general but not currently. Was ok after last session. Still doing her stretches.    Pertinent History  S/P L sesamoidectomy/1st capsulotomy on 06/17/2019. Has not yet had PT for her procedure. Pt was told to stay off her foot as much as possible for the 6 weeks of therapy. The area where the surgeon  removed the bone is still swollen. Can be on her foot with the orthopedic shoe. Pt states that she cannot actively move her R great toe. She can move it with her hand. The swelling has gone down around her leg. The area around the great toe is still swollen.  Tips of all toes feel like it is pinching at night. L foot bothers her when she walks. Doctor said that the pain in the back of her L gastroc is due to her putting weight on her L LE. Pt is a single parent with a son.    Patient Stated Goals  Walk and able to move her great toe again.    Currently in Pain?  No/denies    Pain Score  0-No pain                               PT Education - 08/31/19 1829    Education Details  ther-ex    Person(s) Educated  Patient    Methods  Explanation;Demonstration;Tactile cues;Verbal cues    Comprehension  Returned demonstration;Verbalized understanding       Objective  Pt currently ambulates with orthopedic shoe with weight on her L lateral foot and heel to decrease pressure on her medial foot.  Antalgic, slight decreased stance L LE No abnormal redness, swelling, discoloration, or warmth L leg compared to R. Pain with L calf squeeze. Pt states US on L leg 07/17/19 was negative for DVT.     Manual therapy  Gentle STM dorsal scar tissue             and plantar scar tissue  Supine with L foot propped, effleurage L foot to decrease swelling Decreased L foot and ankle swelling observed.   Supine STM dorsal foot to decrease fascial restrictions  supine toe flexion and extension stretch    Therapeutic exercise  Supine MTP flexion and extension PROM 10x3 Supine L great toe abduction and adduction 10x3 PROM   Supine MTP flexion and extension AAROM with PT 10x3 Supine MTP abduction and adduction AAROM with PT 10x3   Supine L great toe and digit 2 finger squeeze 10x3 with 5 second holds    Improved exercise technique, movement at target  joints, use of target muscles aftermin tomod verbal, visual, tactile cues.    Response to treatment Pt tolerated session well without aggravation of symptoms.  Clinical impression Decreasing L foot swelling observed. Improving ability to curl great toe and perform adduction of great toe. No pain after session. Pt will benefit from continued skilled physical therapy services to decrease pain, swelling, improve ROM, strength, funciton and decrease difficulty with gait.        PT Short Term Goals - 08/23/19 1536      PT SHORT TERM GOAL #1   Title  Pt will be independent with her HEP to decrease swelling, improve strength and ability to ambulate.    Baseline  Pt started her HEP (08/09/2019); Pt performing her HEP, more exercises added periodically (08/23/2019)    Time  3    Period  Weeks    Status  Partially Met    Target Date  09/01/19        PT Long Term Goals - 08/23/19 1451      PT LONG TERM GOAL #1   Title  Patient will improve bilateral hip extension and abduction strength by 1/2 MMT grade to promote better mechanics at her L foot to promote ability to ambulate with less difficulty.    Baseline  Hip extension 4-/5 R, 4/5 L; hip abduction 4-/5 R, 4/5 L (08/09/2019); hip extension 4/5 R and L, hip abduction 4+/5 R and L (08/23/2019)    Time  6    Period  Weeks    Status  New    Target Date  09/22/19      PT LONG TERM GOAL #2   Title  Pt will improve L great toe flexion and extension strength by at least 1/2 MMT grade to promote ability to ambulate with less difficulty.    Baseline  1/5 L great toe flexion and extension strength (08/09/2019); 1/5 extension, 2-/5 flexion (08/23/2019)    Time  6    Period  Weeks    Status  On-going    Target Date  09/22/19      PT LONG TERM GOAL #3   Title  Pt will improve L foot FOTO score by at least 20 points as a demonstration of improved function.    Baseline  L foot FOTO: 28 (08/09/2019); 22 (08/23/2019)    Time  6    Period  Weeks     Status  On-going    Target Date  09/22/19  Plan - 08/31/19 1829    Clinical Impression Statement  Decreasing L foot swelling observed. Improving ability to curl great toe and perform adduction of great toe. No pain after session. Pt will benefit from continued skilled physical therapy services to decrease pain, swelling, improve ROM, strength, funciton and decrease difficulty with gait.    Personal Factors and Comorbidities  Time since onset of injury/illness/exacerbation;Profession;Finances    Examination-Activity Limitations  Lift;Squat;Stairs;Stand    Stability/Clinical Decision Making  Stable/Uncomplicated    Rehab Potential  Fair    PT Frequency  2x / week    PT Duration  6 weeks    PT Treatment/Interventions  Therapeutic exercise;Balance training;Neuromuscular re-education;Patient/family education;Manual techniques;Scar mobilization;Passive range of motion;Dry needling;Aquatic Therapy;Electrical Stimulation;Iontophoresis 53m/ml Dexamethasone;Ultrasound;Gait training;Stair training;Functional mobility training;Therapeutic activities    PT Next Visit Plan  hip, knee, ankle strengthening/ROM, manual techniques, modalities PRN    PT Home Exercise Plan  ankle pumps, circles, EV/IV, toe flexion/extension    Consulted and Agree with Plan of Care  Patient       Patient will benefit from skilled therapeutic intervention in order to improve the following deficits and impairments:     Visit Diagnosis: Muscle weakness (generalized)  Difficulty in walking, not elsewhere classified  Pain in left ankle and joints of left foot     Problem List Patient Active Problem List   Diagnosis Date Noted  . Migraine 05/10/2014  . Headache 04/18/2014  . Obesity, unspecified 04/18/2014    MJoneen BoersPT, DPT   08/31/2019, 6:33 PM  [facility] [address] [place] [address]Phone: [phone]  Fax:   [phone] Name: Danielle ORTLOFFMRN: [id number] of Birth: 9Jun 04, 1987

## 2019-09-06 ENCOUNTER — Other Ambulatory Visit: Payer: Self-pay

## 2019-09-06 ENCOUNTER — Ambulatory Visit: Payer: Medicaid Other

## 2019-09-06 DIAGNOSIS — M25572 Pain in left ankle and joints of left foot: Secondary | ICD-10-CM

## 2019-09-06 DIAGNOSIS — R262 Difficulty in walking, not elsewhere classified: Secondary | ICD-10-CM

## 2019-09-06 DIAGNOSIS — M6281 Muscle weakness (generalized): Secondary | ICD-10-CM | POA: Diagnosis not present

## 2019-09-06 NOTE — Therapy (Signed)
[place] [facility] [address], [place], [address] Phone: [phone]   Fax:  [phone]  Physical Therapy Treatment  Patient Details  Name: Danielle Pollard MRN: [id number] Date of Birth: Aug 20, 1985 Referring Provider (PT): Daylene Katayama, DPM   Encounter Date: 09/06/2019  PT End of Session - 09/06/19 1507    Visit Number  6    Number of Visits  16    Date for PT Re-Evaluation  09/22/19    Authorization Type  6    Authorization Time Period  of  16 Medicaid    PT Start Time  1507    PT Stop Time  1547    PT Time Calculation (min)  40 min    Activity Tolerance  Patient tolerated treatment well    Behavior During Therapy  [facility] for tasks assessed/performed       Past Medical History:  Diagnosis Date  . Anemia   . Asthma    WELL CONTROLLED-LAST USED INHALER IN 2012  . Migraines    MIGRAINES  . Sickle cell trait [facility])     Past Surgical History:  Procedure Laterality Date  . ABDOMINAL HYSTERECTOMY    . CESAREAN SECTION    . DIAGNOSTIC LAPAROSCOPY  2012   Dr. Laurey Morale; lysis of adhesions  . LAPAROSCOPIC HYSTERECTOMY Bilateral 01/05/2018   Procedure: HYSTERECTOMY TOTAL LAPAROSCOPIC, BILATERAL SALPINGECTOMY;  Surgeon: Gae Dry, MD;  Location: [facility];  Service: Gynecology;  Laterality: Bilateral;  . LAPAROSCOPY N/A 01/05/2018   Procedure: LAPAROSCOPY DIAGNOSTIC;  Surgeon: Gae Dry, MD;  Location: [facility];  Service: Gynecology;  Laterality: N/A;    There were no vitals filed for this visit.  Subjective Assessment - 09/06/19 1508    Subjective  L foot is bad. Has been in pain since Saturday morning. The top incision area feels like there is swelling there. No change in activity. 10/10 L great toe area currently. Was fine after last session.    Pertinent History  S/P L sesamoidectomy/1st capsulotomy on 06/17/2019. Has not yet had PT for her procedure. Pt was told to stay off her foot as much as  possible for the 6 weeks of therapy. The area where the surgeon removed the bone is still swollen. Can be on her foot with the orthopedic shoe. Pt states that she cannot actively move her R great toe. She can move it with her hand. The swelling has gone down around her leg. The area around the great toe is still swollen.  Tips of all toes feel like it is pinching at night. L foot bothers her when she walks. Doctor said that the pain in the back of her L gastroc is due to her putting weight on her L LE. Pt is a single parent with a son.    Patient Stated Goals  Walk and able to move her great toe again.    Currently in Pain?  Yes    Pain Score  10-Worst pain ever   Pain level reported does not match clinical presentation                              PT Education - 09/06/19 1658    Education Details  ther-ex    Person(s) Educated  Patient    Methods  Explanation;Demonstration;Tactile cues;Verbal cues    Comprehension  Returned demonstration;Verbalized understanding  Objective  Pt currently ambulates with orthopedic shoe with weight on her L lateral foot and heel to decrease pressure on her medial foot. Antalgic, slight decreased stance L LE No abnormal redness, swelling, discoloration, or warmth L leg compared to R. Pain with L calf squeeze. Pt states US on L leg 07/17/19 was negative for DVT.     Next MD appointment Friday 09/16/2019  Manual therapy   Gentle STM dorsal scar tissue and plantar scar tissue  Supine with L foot propped,effleurage L foot to decrease swelling Decreased L foot and ankle swelling observed.   Supine gentle STM plantar fascia to decrease stiffness.    Therapeutic exercise  Supine MTP flexion and extension PROM 10x3 Supine L great toe abduction and adduction 10x3PROM   Supine MTP flexion and extension AAROM with PT 10x3 Supine MTP abduction and adduction AAROM with PT  10x3   Supine L great toe and digit 2, finger squeeze 10x3with 5 second holds   Improved exercise technique, movement at target joints, use of target muscles aftermin tomod verbal, visual, tactile cues.    Response to treatment Pt tolerated session well without aggravation of symptoms.  Clinical impression   Continued working on effleurage to decrease swelling. Impropving ability to flex great toe today. No change in reported pain level after session but slight decrease in swelling observed after manual therapy. Pt will benefit from continued skilled physical therapy services to decrease swelling, pain, as well as improve ROM, strength, function, and ability to ambulate.      PT Short Term Goals - 08/23/19 1536      PT SHORT TERM GOAL #1   Title  Pt will be independent with her HEP to decrease swelling, improve strength and ability to ambulate.    Baseline  Pt started her HEP (08/09/2019); Pt performing her HEP, more exercises added periodically (08/23/2019)    Time  3    Period  Weeks    Status  Partially Met    Target Date  09/01/19        PT Long Term Goals - 08/23/19 1451      PT LONG TERM GOAL #1   Title  Patient will improve bilateral hip extension and abduction strength by 1/2 MMT grade to promote better mechanics at her L foot to promote ability to ambulate with less difficulty.    Baseline  Hip extension 4-/5 R, 4/5 L; hip abduction 4-/5 R, 4/5 L (08/09/2019); hip extension 4/5 R and L, hip abduction 4+/5 R and L (08/23/2019)    Time  6    Period  Weeks    Status  New    Target Date  09/22/19      PT LONG TERM GOAL #2   Title  Pt will improve L great toe flexion and extension strength by at least 1/2 MMT grade to promote ability to ambulate with less difficulty.    Baseline  1/5 L great toe flexion and extension strength (08/09/2019); 1/5 extension, 2-/5 flexion (08/23/2019)    Time  6    Period  Weeks    Status  On-going    Target Date  09/22/19       PT LONG TERM GOAL #3   Title  Pt will improve L foot FOTO score by at least 20 points as a demonstration of improved function.    Baseline  L foot FOTO: 28 (08/09/2019); 22 (08/23/2019)    Time  6    Period  Weeks  Status  On-going    Target Date  09/22/19            Plan - 09/06/19 1658    Clinical Impression Statement  Continued working on effleurage to decrease swelling. Impropving ability to flex great toe today. No change in reported pain level after session but slight decrease in swelling observed after manual therapy. Pt will benefit from continued skilled physical therapy services to decrease swelling, pain, as well as improve ROM, strength, function, and ability to ambulate.    Personal Factors and Comorbidities  Time since onset of injury/illness/exacerbation;Profession;Finances    Examination-Activity Limitations  Lift;Squat;Stairs;Stand    Stability/Clinical Decision Making  Stable/Uncomplicated    Rehab Potential  Fair    PT Frequency  2x / week    PT Duration  6 weeks    PT Treatment/Interventions  Therapeutic exercise;Balance training;Neuromuscular re-education;Patient/family education;Manual techniques;Scar mobilization;Passive range of motion;Dry needling;Aquatic Therapy;Electrical Stimulation;Iontophoresis 62m/ml Dexamethasone;Ultrasound;Gait training;Stair training;Functional mobility training;Therapeutic activities    PT Next Visit Plan  hip, knee, ankle strengthening/ROM, manual techniques, modalities PRN    PT Home Exercise Plan  ankle pumps, circles, EV/IV, toe flexion/extension    Consulted and Agree with Plan of Care  Patient       Patient will benefit from skilled therapeutic intervention in order to improve the following deficits and impairments:     Visit Diagnosis: Muscle weakness (generalized)  Difficulty in walking, not elsewhere classified  Pain in left ankle and joints of left foot     Problem List Patient Active Problem List   Diagnosis  Date Noted  . Migraine 05/10/2014  . Headache 04/18/2014  . Obesity, unspecified 04/18/2014    MJoneen BoersPT, DPT   09/06/2019, 5:03 PM  [place] [facility] [address] [place] [address]Phone: [phone]  Fax:  [phone] Name: Danielle CANNADYMRN: [id number] of Birth: 9February 02, 1987

## 2019-09-08 ENCOUNTER — Other Ambulatory Visit: Payer: Self-pay

## 2019-09-08 ENCOUNTER — Ambulatory Visit: Payer: Medicaid Other

## 2019-09-08 DIAGNOSIS — M6281 Muscle weakness (generalized): Secondary | ICD-10-CM | POA: Diagnosis not present

## 2019-09-08 DIAGNOSIS — M25572 Pain in left ankle and joints of left foot: Secondary | ICD-10-CM

## 2019-09-08 DIAGNOSIS — R262 Difficulty in walking, not elsewhere classified: Secondary | ICD-10-CM

## 2019-09-08 NOTE — Therapy (Signed)
[facility] [address] [place], [address] Phone: [phone]   Fax:  [phone]  Physical Therapy Treatment  Patient Details  Name: Danielle Pollard MRN: [id number] Date of Birth: Dec 13, 1985 Referring Provider (PT): Daylene Katayama, DPM   Encounter Date: 09/08/2019  PT End of Session - 09/08/19 1751    Visit Number  7    Number of Visits  16    Date for PT Re-Evaluation  09/22/19    Authorization Type  7    Authorization Time Period  of  16 Medicaid    PT Start Time  7619    PT Stop Time  1810    PT Time Calculation (min)  40 min    Equipment Utilized During Treatment  Gait belt    Activity Tolerance  Patient tolerated treatment well;No increased pain    Behavior During Therapy  WFL for tasks assessed/performed       Past Medical History:  Diagnosis Date  . Anemia   . Asthma    WELL CONTROLLED-LAST USED INHALER IN 2012  . Migraines    MIGRAINES  . Sickle cell trait [facility])     Past Surgical History:  Procedure Laterality Date  . ABDOMINAL HYSTERECTOMY    . CESAREAN SECTION    . DIAGNOSTIC LAPAROSCOPY  2012   Dr. Laurey Morale; lysis of adhesions  . LAPAROSCOPIC HYSTERECTOMY Bilateral 01/05/2018   Procedure: HYSTERECTOMY TOTAL LAPAROSCOPIC, BILATERAL SALPINGECTOMY;  Surgeon: Gae Dry, MD;  Location: [facility];  Service: Gynecology;  Laterality: Bilateral;  . LAPAROSCOPY N/A 01/05/2018   Procedure: LAPAROSCOPY DIAGNOSTIC;  Surgeon: Gae Dry, MD;  Location: [facility];  Service: Gynecology;  Laterality: N/A;    There were no vitals filed for this visit.  Subjective Assessment - 09/08/19 1731    Subjective  Pt reports foot remains swollen and aggravated since last session. She reported some redness over the foot dorsum which has since resolved.    Pertinent History  S/P L sesamoidectomy/1st capsulotomy on 06/17/2019. Has not yet had PT for her procedure. Pt was told to stay off her foot as  much as possible for the 6 weeks of therapy. The area where the surgeon removed the bone is still swollen. Can be on her foot with the orthopedic shoe. Pt states that she cannot actively move her R great toe. She can move it with her hand. The swelling has gone down around her leg. The area around the great toe is still swollen.  Tips of all toes feel like it is pinching at night. L foot bothers her when she walks. Doctor said that the pain in the back of her L gastroc is due to her putting weight on her L LE. Pt is a single parent with a son.    Currently in Pain?  Yes    Pain Score  8     Pain Location  Foot    Pain Orientation  Left      INTERVENTION THIS DATE:  Manual therapy  -Gentle STM dorsal scar tissue              and plantar scar tissue  -Supine with L foot propped, effleurage L foot to decrease swelling             Decreased L foot and ankle swelling observed.   -Supine gentle STM plantar fascia to decrease stiffness.    -hallux longitudinal distraction mobilization 15x3secH    Therapeutic exercise -  Gross toe+hallux flexion 2x15x3secH    -Left hallux extension A/ROM (no detectable activation of hallus long or short extensors) -Left ankle dorsiflexion 20x3secH  -seated closed chain plantar flexion 2x15, cues to avoid extreme supination -seated Left foot rotation slides, cues for full foot contact 1x10 bilat -Left towel scruntches x15 (poor activation in all five toes, including 2-5   Improved exercise technique, movement at target joints, use of target muscles after min to mod verbal, visual, tactile cues.     PT Short Term Goals - 08/23/19 1536      PT SHORT TERM GOAL #1   Title  Pt will be independent with her HEP to decrease swelling, improve strength and ability to ambulate.    Baseline  Pt started her HEP (08/09/2019); Pt performing her HEP, more exercises added periodically (08/23/2019)    Time  3    Period  Weeks    Status  Partially Met    Target Date  09/01/19         PT Long Term Goals - 08/23/19 1451      PT LONG TERM GOAL #1   Title  Patient will improve bilateral hip extension and abduction strength by 1/2 MMT grade to promote better mechanics at her L foot to promote ability to ambulate with less difficulty.    Baseline  Hip extension 4-/5 R, 4/5 L; hip abduction 4-/5 R, 4/5 L (08/09/2019); hip extension 4/5 R and L, hip abduction 4+/5 R and L (08/23/2019)    Time  6    Period  Weeks    Status  New    Target Date  09/22/19      PT LONG TERM GOAL #2   Title  Pt will improve L great toe flexion and extension strength by at least 1/2 MMT grade to promote ability to ambulate with less difficulty.    Baseline  1/5 L great toe flexion and extension strength (08/09/2019); 1/5 extension, 2-/5 flexion (08/23/2019)    Time  6    Period  Weeks    Status  On-going    Target Date  09/22/19      PT LONG TERM GOAL #3   Title  Pt will improve L foot FOTO score by at least 20 points as a demonstration of improved function.    Baseline  L foot FOTO: 28 (08/09/2019); 22 (08/23/2019)    Time  6    Period  Weeks    Status  On-going    Target Date  09/22/19            Plan - 09/08/19 1753    Clinical Impression Statement  Pt able to complete entire session as planned with rest breaks provided as needed. All interventions kept wthin a tolerated level of discomfort as to protect surgical site and note exacerbate pain and swelling. Foot maintained in elevated posture for much of treatment to facilitate swelling. Hallux arthrofibrosis remains fervent and limiting. Pt maintains high level of focus and motivation. Extensive verbal, visual, and tactile cues are provided for most accurate form possible. Pt maintains good focus with stretching and icing at home, reminded not to push too much into pain. Concerning that pt has limited to trace activation of small toe flexors curious for neurological impairment, will continue to monitor. Pt quite frustrated with lack of  progress thus far, Pryor Curia provides encouragement for patience.    Personal Factors and Comorbidities  Time since onset of injury/illness/exacerbation;Profession;Finances    Examination-Activity Limitations  Lift;Squat;Stairs;Stand  Stability/Clinical Decision Making  Stable/Uncomplicated    Rehab Potential  Fair    PT Frequency  2x / week    PT Duration  6 weeks    PT Treatment/Interventions  Therapeutic exercise;Balance training;Neuromuscular re-education;Patient/family education;Manual techniques;Scar mobilization;Passive range of motion;Dry needling;Aquatic Therapy;Electrical Stimulation;Iontophoresis 31m/ml Dexamethasone;Ultrasound;Gait training;Stair training;Functional mobility training;Therapeutic activities    PT Next Visit Plan  hip, knee, ankle strengthening/ROM, manual techniques, modalities PRN    PT Home Exercise Plan  ankle pumps, circles, EV/IV, toe flexion/extension    Consulted and Agree with Plan of Care  Patient       Patient will benefit from skilled therapeutic intervention in order to improve the following deficits and impairments:     Visit Diagnosis: Muscle weakness (generalized)  Difficulty in walking, not elsewhere classified  Pain in left ankle and joints of left foot     Problem List Patient Active Problem List   Diagnosis Date Noted  . Migraine 05/10/2014  . Headache 04/18/2014  . Obesity, unspecified 04/18/2014    Danielle Pollard C 09/08/2019, 6:01 PM  [facility] [address] [place] [address]Phone: [phone]  Fax:  [phone] Name: Danielle WADLEIGHMRN: [id number] of Birth: [birth year]

## 2019-09-13 ENCOUNTER — Ambulatory Visit: Payer: Medicaid Other

## 2019-09-13 ENCOUNTER — Other Ambulatory Visit: Payer: Self-pay

## 2019-09-13 DIAGNOSIS — M25572 Pain in left ankle and joints of left foot: Secondary | ICD-10-CM

## 2019-09-13 DIAGNOSIS — M6281 Muscle weakness (generalized): Secondary | ICD-10-CM

## 2019-09-13 DIAGNOSIS — R262 Difficulty in walking, not elsewhere classified: Secondary | ICD-10-CM

## 2019-09-13 NOTE — Therapy (Signed)
[facility] [address], [place], [address] Phone: [phone]   Fax:  [phone]  Physical Therapy Treatment  Patient Details  Name: Danielle Pollard MRN: [id number] Date of Birth: 04-22-1986 Referring Provider (PT): Daylene Katayama, DPM   Encounter Date: 09/13/2019  PT End of Session - 09/13/19 1430    Visit Number  8    Number of Visits  16    Date for PT Re-Evaluation  09/22/19    Authorization Type  8    Authorization Time Period  of  16 Medicaid    PT Start Time  9244    PT Stop Time  6286    PT Time Calculation (min)  48 min    Equipment Utilized During Treatment  --    Activity Tolerance  Patient tolerated treatment well;No increased pain    Behavior During Therapy  WFL for tasks assessed/performed       Past Medical History:  Diagnosis Date  . Anemia   . Asthma    WELL CONTROLLED-LAST USED INHALER IN 2012  . Migraines    MIGRAINES  . Sickle cell trait [facility])     Past Surgical History:  Procedure Laterality Date  . ABDOMINAL HYSTERECTOMY    . CESAREAN SECTION    . DIAGNOSTIC LAPAROSCOPY  2012   Dr. Laurey Morale; lysis of adhesions  . LAPAROSCOPIC HYSTERECTOMY Bilateral 01/05/2018   Procedure: HYSTERECTOMY TOTAL LAPAROSCOPIC, BILATERAL SALPINGECTOMY;  Surgeon: Gae Dry, MD;  Location: [facility];  Service: Gynecology;  Laterality: Bilateral;  . LAPAROSCOPY N/A 01/05/2018   Procedure: LAPAROSCOPY DIAGNOSTIC;  Surgeon: Gae Dry, MD;  Location: [facility];  Service: Gynecology;  Laterality: N/A;    There were no vitals filed for this visit.  Subjective Assessment - 09/13/19 1432    Subjective  Orthotic shoe got wet so she's currently wearing a flip flop. L great toe has been hurting. 7/10 currently at the distal phalanx (not at the scar tissue). Was discouraged after last session.    Pertinent History  S/P L sesamoidectomy/1st capsulotomy on 06/17/2019. Has not yet had PT for her  procedure. Pt was told to stay off her foot as much as possible for the 6 weeks of therapy. The area where the surgeon removed the bone is still swollen. Can be on her foot with the orthopedic shoe. Pt states that she cannot actively move her R great toe. She can move it with her hand. The swelling has gone down around her leg. The area around the great toe is still swollen.  Tips of all toes feel like it is pinching at night. L foot bothers her when she walks. Doctor said that the pain in the back of her L gastroc is due to her putting weight on her L LE. Pt is a single parent with a son.    Currently in Pain?  Yes    Pain Score  7                                PT Education - 09/13/19 1526    Education Details  ther-ex    Person(s) Educated  Patient    Methods  Explanation;Demonstration;Tactile cues;Verbal cues    Comprehension  Returned demonstration;Verbalized understanding        Objective  Pt currently ambulates with orthopedic shoe with weight on her L lateral foot and heel to decrease pressure  on her medial foot. Antalgic, slight decreased stance L LE No abnormal redness, swelling, discoloration, or warmth L leg compared to R. Pain with L calf squeeze. Pt states US on L leg 07/17/19 was negative for DVT.     Next MD appointment Friday 09/16/2019  Manual therapy  Gentle STM dorsal scar tissue and plantar scar tissue  Supine with L foot propped,effleurage L foot to decrease swelling   Supine gentle STM plantar fascia to decrease stiffness.   hallux longitudinal distraction mobilization 15x3secH   Therapeutic exercise  Supine L LE neural flossing with PT  L LE in SLR position with ankle DF, PT performs hip adduction 20x2 (plantar great toe symptom reproduction but no plantar toe pain at rest after exercise)  Supine with L LE in SLR position  L ankle DF 10x3  L leg extension 10x3   Give as part of HEP next session  to promote neural glide if appropriate   Picking up a crumpled tissue with great toe and 2nd toe 5x  Able to perform.    Improved exercise technique, movement at target joints, use of target muscles aftermin tomod verbal, visual, tactile cues.    Response to treatment Pt tolerated session well without aggravation of symptoms.No L great toe plantar pain after session. Pt able to perform great toe flexion and adduction to pick up tissue ball after session.   Clinical impression              Pt demonstrates L medial plantar nerve neural tension with reproduction of symptoms. Performed neural flossing to promote mobility which alleviated her foot pain. No pain after session. Also continued working on flexion and extension A/AROM of her toes as well as great toe adduction. Pt able to pick up a crumpled ball of tissue paper with her L great toe and 2nd toe 5x after session. Pt will benefit from continued skilled physical therapy services to decrease pain, improve ROM, strength, function, and decrease difficulty with gait.       PT Short Term Goals - 08/23/19 1536      PT SHORT TERM GOAL #1   Title  Pt will be independent with her HEP to decrease swelling, improve strength and ability to ambulate.    Baseline  Pt started her HEP (08/09/2019); Pt performing her HEP, more exercises added periodically (08/23/2019)    Time  3    Period  Weeks    Status  Partially Met    Target Date  09/01/19        PT Long Term Goals - 08/23/19 1451      PT LONG TERM GOAL #1   Title  Patient will improve bilateral hip extension and abduction strength by 1/2 MMT grade to promote better mechanics at her L foot to promote ability to ambulate with less difficulty.    Baseline  Hip extension 4-/5 R, 4/5 L; hip abduction 4-/5 R, 4/5 L (08/09/2019); hip extension 4/5 R and L, hip abduction 4+/5 R and L (08/23/2019)    Time  6    Period  Weeks    Status  New    Target Date  09/22/19      PT LONG TERM GOAL  #2   Title  Pt will improve L great toe flexion and extension strength by at least 1/2 MMT grade to promote ability to ambulate with less difficulty.    Baseline  1/5 L great toe flexion and extension strength (08/09/2019); 1/5 extension, 2-/5 flexion (08/23/2019)  Time  6    Period  Weeks    Status  On-going    Target Date  09/22/19      PT LONG TERM GOAL #3   Title  Pt will improve L foot FOTO score by at least 20 points as a demonstration of improved function.    Baseline  L foot FOTO: 28 (08/09/2019); 22 (08/23/2019)    Time  6    Period  Weeks    Status  On-going    Target Date  09/22/19            Plan - 09/13/19 1430    Clinical Impression Statement  Pt demonstrates L medial plantar nerve neural tension with reproduction of symptoms. Performed neural flossing to promote mobility which alleviated her foot pain. No pain after session. Also continued working on flexion and extension A/AROM of her toes as well as great toe adduction. Pt able to pick up a crumpled ball of tissue paper with her L great toe and 2nd toe 5x after session. Pt will benefit from continued skilled physical therapy services to decrease pain, improve ROM, strength, function, and decrease difficulty with gait.    Personal Factors and Comorbidities  Time since onset of injury/illness/exacerbation;Profession;Finances    Examination-Activity Limitations  Lift;Squat;Stairs;Stand    Stability/Clinical Decision Making  Stable/Uncomplicated    Rehab Potential  Fair    PT Frequency  2x / week    PT Duration  6 weeks    PT Treatment/Interventions  Therapeutic exercise;Balance training;Neuromuscular re-education;Patient/family education;Manual techniques;Scar mobilization;Passive range of motion;Dry needling;Aquatic Therapy;Electrical Stimulation;Iontophoresis 41m/ml Dexamethasone;Ultrasound;Gait training;Stair training;Functional mobility training;Therapeutic activities    PT Next Visit Plan  hip, knee, ankle  strengthening/ROM, manual techniques, modalities PRN    PT Home Exercise Plan  ankle pumps, circles, EV/IV, toe flexion/extension    Consulted and Agree with Plan of Care  Patient       Patient will benefit from skilled therapeutic intervention in order to improve the following deficits and impairments:     Visit Diagnosis: Muscle weakness (generalized)  Difficulty in walking, not elsewhere classified  Pain in left ankle and joints of left foot     Problem List Patient Active Problem List   Diagnosis Date Noted  . Migraine 05/10/2014  . Headache 04/18/2014  . Obesity, unspecified 04/18/2014    MJoneen BoersPT, DPT   09/13/2019, 3:29 PM  [place] [facility] [address] [place] [address]Phone: [phone]  Fax:  [phone] Name: BHARLEI LEHRMANNMRN: [id number] of Birth: 907/30/1987

## 2019-09-15 ENCOUNTER — Ambulatory Visit: Payer: Medicaid Other

## 2019-09-16 ENCOUNTER — Ambulatory Visit (INDEPENDENT_AMBULATORY_CARE_PROVIDER_SITE_OTHER): Payer: Medicaid Other

## 2019-09-16 ENCOUNTER — Ambulatory Visit (INDEPENDENT_AMBULATORY_CARE_PROVIDER_SITE_OTHER): Payer: Medicaid Other | Admitting: Podiatry

## 2019-09-16 ENCOUNTER — Encounter: Payer: Self-pay | Admitting: *Deleted

## 2019-09-16 ENCOUNTER — Other Ambulatory Visit: Payer: Self-pay

## 2019-09-16 DIAGNOSIS — M205X2 Other deformities of toe(s) (acquired), left foot: Secondary | ICD-10-CM

## 2019-09-16 DIAGNOSIS — M216X2 Other acquired deformities of left foot: Secondary | ICD-10-CM | POA: Diagnosis not present

## 2019-09-16 DIAGNOSIS — M2580 Other specified joint disorders, unspecified joint: Secondary | ICD-10-CM

## 2019-09-19 NOTE — Progress Notes (Signed)
   Subjective:  Patient presents today status post fibular sesamoidectomy with 1st MPJ capsulotomy left. DOS: 06/17/2019. She reports sharp, throbbing pain to the left great toe that occurs primarily at night. She reports intermittent associated redness of the toe. Wearing tennis shoes increases her pain. She denies alleviating factors. Patient is here for further evaluation and treatment.    Past Medical History:  Diagnosis Date  . Anemia   . Asthma    WELL CONTROLLED-LAST USED INHALER IN 2012  . Migraines    MIGRAINES  . Sickle cell trait ([facility])       Objective/Physical Exam Neurovascular status intact.  Skin incisions appear to be well coapted. No sign of infectious process noted. No dehiscence. No active bleeding noted. Moderate edema noted to the surgical extremity. Pain on palpation with limited range of motion noted to the first MPJ left foot secondary to scar tissue.  Radiographic Exam: Degenerative changes noted with joint space narrowing first MPJ. There also appears to be extra-articular spurring noted about the joint.  Abscence of fibular sesmoid noted. Joint spaces preserved. No fracture identified.   Assessment: 1. s/p fibular sesamoidectomy with 1st MPJ capsulotomy left. DOS: 06/17/2019 2. 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 Patient was evaluated. X-Rays reviewed.  2. Injection of 0.5 mLs Celestone Soluspan injected into the 1st MPJ of the left foot.  3. Aggressive ROM/ manipulation performed today.  4. Continue physical therapy at [facility] on Tuesdays and Thursdays.  5. Return to clinic in 4 weeks.    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    Felecia Shelling, DPM [facility]  Dr. Felecia Shelling, DPM    [address]                                        [place], [place] [address]                Office [phone]  Fax [phone]

## 2019-09-20 ENCOUNTER — Other Ambulatory Visit: Payer: Self-pay

## 2019-09-20 ENCOUNTER — Ambulatory Visit: Payer: Medicaid Other

## 2019-09-20 DIAGNOSIS — M25572 Pain in left ankle and joints of left foot: Secondary | ICD-10-CM

## 2019-09-20 DIAGNOSIS — M6281 Muscle weakness (generalized): Secondary | ICD-10-CM

## 2019-09-20 DIAGNOSIS — R262 Difficulty in walking, not elsewhere classified: Secondary | ICD-10-CM

## 2019-09-20 NOTE — Therapy (Signed)
[facility] [address], [place], [address] Phone: [phone]   Fax:  [phone]  Physical Therapy Treatment  Patient Details  Name: Danielle Pollard MRN: [id number] Date of Birth: 08/01/1985 Referring Provider (PT): Daylene Katayama, DPM   Encounter Date: 09/20/2019  PT End of Session - 09/20/19 1444    Visit Number  9    Number of Visits  16    Date for PT Re-Evaluation  09/22/19    Authorization Type  9    Authorization Time Period  of  16 Medicaid    PT Start Time  7510    PT Stop Time  2585    PT Time Calculation (min)  46 min    Activity Tolerance  Patient tolerated treatment well;No increased pain    Behavior During Therapy  WFL for tasks assessed/performed       Past Medical History:  Diagnosis Date  . Anemia   . Asthma    WELL CONTROLLED-LAST USED INHALER IN 2012  . Migraines    MIGRAINES  . Sickle cell trait [facility])     Past Surgical History:  Procedure Laterality Date  . ABDOMINAL HYSTERECTOMY    . CESAREAN SECTION    . DIAGNOSTIC LAPAROSCOPY  2012   Dr. Laurey Morale; lysis of adhesions  . LAPAROSCOPIC HYSTERECTOMY Bilateral 01/05/2018   Procedure: HYSTERECTOMY TOTAL LAPAROSCOPIC, BILATERAL SALPINGECTOMY;  Surgeon: Gae Dry, MD;  Location: [facility] ORS;  Service: Gynecology;  Laterality: Bilateral;  . LAPAROSCOPY N/A 01/05/2018   Procedure: LAPAROSCOPY DIAGNOSTIC;  Surgeon: Gae Dry, MD;  Location: [facility];  Service: Gynecology;  Laterality: N/A;    There were no vitals filed for this visit.  Subjective Assessment - 09/20/19 1446    Subjective  Pt was told by her doctor to wear her sneakers to see how it goes. She also got a shot last Friday. If pt is unable to bend her R MPJ by 10/14/19, she might need a joint replacement.    Pertinent History  S/P L sesamoidectomy/1st capsulotomy on 06/17/2019. Has not yet had PT for her procedure. Pt was told to stay off her foot as much as  possible for the 6 weeks of therapy. The area where the surgeon removed the bone is still swollen. Can be on her foot with the orthopedic shoe. Pt states that she cannot actively move her R great toe. She can move it with her hand. The swelling has gone down around her leg. The area around the great toe is still swollen.  Tips of all toes feel like it is pinching at night. L foot bothers her when she walks. Doctor said that the pain in the back of her L gastroc is due to her putting weight on her L LE. Pt is a single parent with a son.    Currently in Pain?  No/denies    Pain Score  0-No pain                               PT Education - 09/20/19 1451    Education Details  ther-ex    Person(s) Educated  Patient    Methods  Explanation;Demonstration;Tactile cues;Verbal cues    Comprehension  Returned demonstration;Verbalized understanding         Objective  Pt currently ambulates with orthopedic shoe with weight on her L lateral foot and heel to decrease pressure on her  medial foot. Antalgic, slight decreased stance L LE No abnormal redness, swelling, discoloration, or warmth L leg compared to R. Pain with L calf squeeze. Pt states US on L leg 07/17/19 was negative for DVT.     Pt observed ambulating in clinic with sneakers today 09/20/2019  Next MD appointment Friday 09/16/2019  Manual therapy   Supine gentle great toe distraction to promote flexion ROM   Decreased stiffness palpated   Therapeutic exercise  S/L manually resisted hip abduction, prone hip extension  Hip abduction R 4+/5, L 4/5; hip extension R 4/5, L 4+/5  Prone glute extension   R 10x3  L 10x3  S/L hip abduction   R 10x3  L 10x3   Supine MTP flexion and extension PROM 10x4 Supine L great toe abduction and adduction 10x3PROM   Supine MTP flexion and extension AAROM with PT 10x3 Supine MTP abduction and adductionAAROM with PT 10x3   Supine L great toe and digit  2, finger squeeze 10x3with 5 second holds    Response to treatment No complain of increased pain trhoughout session   Clinical impression Emphasis on improving L first Metatarsal phalangeal joint mobility to promote flexion ROM secondary to stiffness. Improved mobility palpated during gentle joint distraction. Limited L great toe flexion as well secondary to extensor hallucis longus muscle tension. Difficulty with active L great toe flexion, extension, abduction and adduction ROM secondary to weakness but slowly improving. Pt will benefit from continued skilled physical therapy services to improve ROM, strength, and function.   PT Short Term Goals - 08/23/19 1536      PT SHORT TERM GOAL #1   Title  Pt will be independent with her HEP to decrease swelling, improve strength and ability to ambulate.    Baseline  Pt started her HEP (08/09/2019); Pt performing her HEP, more exercises added periodically (08/23/2019)    Time  3    Period  Weeks    Status  Partially Met    Target Date  09/01/19        PT Long Term Goals - 08/23/19 1451      PT LONG TERM GOAL #1   Title  Patient will improve bilateral hip extension and abduction strength by 1/2 MMT grade to promote better mechanics at her L foot to promote ability to ambulate with less difficulty.    Baseline  Hip extension 4-/5 R, 4/5 L; hip abduction 4-/5 R, 4/5 L (08/09/2019); hip extension 4/5 R and L, hip abduction 4+/5 R and L (08/23/2019)    Time  6    Period  Weeks    Status  New    Target Date  09/22/19      PT LONG TERM GOAL #2   Title  Pt will improve L great toe flexion and extension strength by at least 1/2 MMT grade to promote ability to ambulate with less difficulty.    Baseline  1/5 L great toe flexion and extension strength (08/09/2019); 1/5 extension, 2-/5 flexion (08/23/2019)    Time  6    Period  Weeks    Status  On-going    Target Date  09/22/19      PT LONG TERM GOAL #3   Title  Pt will improve L foot FOTO score  by at least 20 points as a demonstration of improved function.    Baseline  L foot FOTO: 28 (08/09/2019); 22 (08/23/2019)    Time  6    Period  Weeks  Status  On-going    Target Date  09/22/19            Plan - 09/20/19 1451    Clinical Impression Statement  Emphasis on improving L first Metatarsal phalangeal joint mobility to promote flexion ROM secondary to stiffness. Improved mobility palpated during gentle joint distraction. Limited L great toe flexion as well secondary to extensor hallucis longus muscle tension. Difficulty with active L great toe flexion, extension, abduction and adduction ROM secondary to weakness but slowly improving. Pt will benefit from continued skilled physical therapy services to improve ROM, strength, and function.    Personal Factors and Comorbidities  Time since onset of injury/illness/exacerbation;Profession;Finances    Examination-Activity Limitations  Lift;Squat;Stairs;Stand    Stability/Clinical Decision Making  Stable/Uncomplicated    Rehab Potential  Fair    PT Frequency  2x / week    PT Duration  6 weeks    PT Treatment/Interventions  Therapeutic exercise;Balance training;Neuromuscular re-education;Patient/family education;Manual techniques;Scar mobilization;Passive range of motion;Dry needling;Aquatic Therapy;Electrical Stimulation;Iontophoresis 14m/ml Dexamethasone;Ultrasound;Gait training;Stair training;Functional mobility training;Therapeutic activities    PT Next Visit Plan  hip, knee, ankle strengthening/ROM, manual techniques, modalities PRN    PT Home Exercise Plan  ankle pumps, circles, EV/IV, toe flexion/extension    Consulted and Agree with Plan of Care  Patient       Patient will benefit from skilled therapeutic intervention in order to improve the following deficits and impairments:     Visit Diagnosis: Muscle weakness (generalized)  Difficulty in walking, not elsewhere classified  Pain in left ankle and joints of left  foot     Problem List Patient Active Problem List   Diagnosis Date Noted  . Migraine 05/10/2014  . Headache 04/18/2014  . Obesity, unspecified 04/18/2014    MJoneen BoersPT, DPT   09/20/2019, 3:52 PM  [place] [facility] [address] [place] [address]Phone: [phone]  Fax:  [phone] Name: Danielle VOWELLMRN: [id number] of Birth: [birth year]

## 2019-09-22 ENCOUNTER — Ambulatory Visit: Payer: Medicaid Other

## 2019-09-22 ENCOUNTER — Other Ambulatory Visit: Payer: Self-pay

## 2019-09-22 DIAGNOSIS — M6281 Muscle weakness (generalized): Secondary | ICD-10-CM | POA: Diagnosis not present

## 2019-09-22 DIAGNOSIS — R262 Difficulty in walking, not elsewhere classified: Secondary | ICD-10-CM

## 2019-09-22 DIAGNOSIS — M25572 Pain in left ankle and joints of left foot: Secondary | ICD-10-CM

## 2019-09-22 NOTE — Patient Instructions (Signed)
Supine MTP flexion and extension AAROM with PT 10x3 with 5 second holds Supine MTP abduction and adductionAAROM with PT 10x3 with 5 second holds  reveiwed and given aforemenitoned exercises as part of her HEP. Pt demonstrated and verbalized understanding.

## 2019-09-22 NOTE — Therapy (Signed)
[facility] [address] [place], [address] Phone: [phone]   Fax:  [phone]  Physical Therapy Treatment  Patient Details  Name: Danielle Pollard MRN: [id number] Date of Birth: 03-22-1986 Referring Provider (PT): Daylene Katayama, DPM   Encounter Date: 09/22/2019  PT End of Session - 09/22/19 1548    Visit Number  10    Number of Visits  16    Date for PT Re-Evaluation  10/13/19    Authorization Type  10    Authorization Time Period  of  16 Medicaid    PT Start Time  4270    PT Stop Time  6237    PT Time Calculation (min)  41 min    Activity Tolerance  Patient tolerated treatment well;No increased pain    Behavior During Therapy  WFL for tasks assessed/performed       Past Medical History:  Diagnosis Date  . Anemia   . Asthma    WELL CONTROLLED-LAST USED INHALER IN 2012  . Migraines    MIGRAINES  . Sickle cell trait [facility])     Past Surgical History:  Procedure Laterality Date  . ABDOMINAL HYSTERECTOMY    . CESAREAN SECTION    . DIAGNOSTIC LAPAROSCOPY  2012   Dr. Laurey Morale; lysis of adhesions  . LAPAROSCOPIC HYSTERECTOMY Bilateral 01/05/2018   Procedure: HYSTERECTOMY TOTAL LAPAROSCOPIC, BILATERAL SALPINGECTOMY;  Surgeon: Gae Dry, MD;  Location: [facility];  Service: Gynecology;  Laterality: Bilateral;  . LAPAROSCOPY N/A 01/05/2018   Procedure: LAPAROSCOPY DIAGNOSTIC;  Surgeon: Gae Dry, MD;  Location: [facility];  Service: Gynecology;  Laterality: N/A;    There were no vitals filed for this visit.  Subjective Assessment - 09/22/19 1550    Subjective  L foot is ok. No pain currently and while walking. L great toe movement feels like it got stuck again. Her doctor said that she can put weight on her L foot.    Pertinent History  S/P L sesamoidectomy/1st capsulotomy on 06/17/2019. Has not yet had PT for her procedure. Pt was told to stay off her foot as much as possible for the 6  weeks of therapy. The area where the surgeon removed the bone is still swollen. Can be on her foot with the orthopedic shoe. Pt states that she cannot actively move her R great toe. She can move it with her hand. The swelling has gone down around her leg. The area around the great toe is still swollen.  Tips of all toes feel like it is pinching at night. L foot bothers her when she walks. Doctor said that the pain in the back of her L gastroc is due to her putting weight on her L LE. Pt is a single parent with a son.    Currently in Pain?  No/denies    Pain Score  0-No pain                               PT Education - 09/22/19 2100    Education Details  ther-ex, HEP    Person(s) Educated  Patient    Methods  Explanation;Demonstration;Tactile cues;Verbal cues    Comprehension  Returned demonstration;Verbalized understanding      Objective  Pt currently ambulates with orthopedic shoe with weight on her L lateral foot and heel to decrease pressure on her medial foot. Antalgic, slight decreased stance L LE No  abnormal redness, swelling, discoloration, or warmth L leg compared to R. Pain with L calf squeeze. Pt states US on L leg 07/17/19 was negative for DVT.     Pt observed ambulating in clinic with sneakers today 09/20/2019  Next MD appointment   Manual therapy  Supine gentle great toe distraction to promote flexion ROM              Decreased stiffness palpated   Therapeutic exercise   Gait: Decreased weight bearing L medial foot observed.   Supine R great toe flexion and extension multiple times.   Reviewed progress with AROM with pt.   Supine MTP flexion and extension AAROM with PT 10x3 with 5 second holds Supine MTP abduction and adductionAAROM with PT 10x3 with 5 second holds  reveiwed and given aforemenitoned exercises as part of her HEP. Pt demonstrated and verbalized understanding.    Supine L great toe and digit 2,finger  squeeze 10x2with 5 second holds    Response to treatment No complain of increased pain trhoughout session   Clinical impression Improving ability to perform R great toe flexion and extension AROM at metatarsophalangial joint. Improved mobiliy after joint distraction. Pt also demonstrates overall improved bilateral hip strength, function and decreased swelling since initial evaluation. Pt making very good progress with PT towards goals. Pt still demonstrates joint stiffness, decreased scar tissue mobility swelling, and difficulty with closed chain activities and gait and would benefit from continued skilled physical therapy services to address the aforementioned deficits.       PT Short Term Goals - 09/22/19 1553      PT SHORT TERM GOAL #1   Title  Pt will be independent with her HEP to decrease swelling, improve strength and ability to ambulate.    Baseline  Pt started her HEP (08/09/2019); Pt performing her HEP, more exercises added periodically (08/23/2019), (09/22/2019)    Time  3    Period  Weeks    Status  Partially Met    Target Date  09/01/19        PT Long Term Goals - 09/22/19 1554      PT LONG TERM GOAL #1   Title  Patient will improve bilateral hip extension and abduction strength by 1/2 MMT grade to promote better mechanics at her L foot to promote ability to ambulate with less difficulty.    Baseline  Hip extension 4-/5 R, 4/5 L; hip abduction 4-/5 R, 4/5 L (08/09/2019); hip extension 4/5 R and L, hip abduction 4+/5 R and L (08/23/2019); Hip abduction R 4+/5, L 4/5; hip extension R 4/5, L 4+/5 (09/20/2019)    Time  3    Period  Weeks    Status  Partially Met    Target Date  10/13/19      PT LONG TERM GOAL #2   Title  Pt will improve L great toe flexion and extension strength by at least 1/2 MMT grade to promote ability to ambulate with less difficulty.    Baseline  1/5 L great toe flexion and extension strength (08/09/2019); 1/5 extension, 2-/5 flexion  (08/23/2019), 2-/5 flexion and extension after treatment  (09/22/2019)    Time  3    Status  Partially Met    Target Date  10/13/19      PT LONG TERM GOAL #3   Title  Pt will improve L foot FOTO score by at least 20 points as a demonstration of improved function.    Baseline  L foot FOTO:  28 (08/09/2019); 22 (08/23/2019); 55 (09/22/2019)    Time  3    Period  Weeks    Status  Achieved    Target Date  09/22/19            Plan - 09/22/19 1549    Clinical Impression Statement  Improving ability to perform R great toe flexion and extension AROM at metatarsophalangial joint. Improved mobiliy after joint distraction. Pt also demonstrates overall improved bilateral hip strength, function and decreased swelling since initial evaluation. Pt making very good progress with PT towards goals. Pt still demonstrates joint stiffness, decreased scar tissue mobility swelling, and difficulty with closed chain activities and gait and would benefit from continued skilled physical therapy services to address the aforementioned deficits.    Personal Factors and Comorbidities  Time since onset of injury/illness/exacerbation;Profession;Finances    Examination-Activity Limitations  Lift;Squat;Stairs;Stand    Stability/Clinical Decision Making  Stable/Uncomplicated    Clinical Decision Making  Low    Rehab Potential  Fair    PT Frequency  2x / week    PT Duration  3 weeks    PT Treatment/Interventions  Therapeutic exercise;Balance training;Neuromuscular re-education;Patient/family education;Manual techniques;Scar mobilization;Passive range of motion;Dry needling;Aquatic Therapy;Electrical Stimulation;Iontophoresis 22m/ml Dexamethasone;Ultrasound;Gait training;Stair training;Functional mobility training;Therapeutic activities    PT Next Visit Plan  hip, knee, ankle strengthening/ROM, manual techniques, modalities PRN    PT Home Exercise Plan  ankle pumps, circles, EV/IV, toe flexion/extension    Consulted and Agree  with Plan of Care  Patient       Patient will benefit from skilled therapeutic intervention in order to improve the following deficits and impairments:     Visit Diagnosis: Muscle weakness (generalized) - Plan: PT plan of care cert/re-cert  Difficulty in walking, not elsewhere classified - Plan: PT plan of care cert/re-cert  Pain in left ankle and joints of left foot - Plan: PT plan of care cert/re-cert     Problem List Patient Active Problem List   Diagnosis Date Noted  . Migraine 05/10/2014  . Headache 04/18/2014  . Obesity, unspecified 04/18/2014    MJoneen BoersPT, DPT   09/22/2019, 9:16 PM  [facility] [address] [place] [address]Phone: [phone]  Fax:  [phone] Name: BKAHLEAH CRASSMRN: [id number] of Birth: 9April 04, 1987

## 2019-09-26 ENCOUNTER — Ambulatory Visit: Payer: Medicaid Other

## 2019-09-28 ENCOUNTER — Ambulatory Visit: Payer: Medicaid Other

## 2019-10-03 ENCOUNTER — Ambulatory Visit: Payer: Medicaid Other | Attending: Podiatry

## 2019-10-03 DIAGNOSIS — M25572 Pain in left ankle and joints of left foot: Secondary | ICD-10-CM | POA: Insufficient documentation

## 2019-10-03 DIAGNOSIS — M6281 Muscle weakness (generalized): Secondary | ICD-10-CM | POA: Insufficient documentation

## 2019-10-03 DIAGNOSIS — R262 Difficulty in walking, not elsewhere classified: Secondary | ICD-10-CM | POA: Insufficient documentation

## 2019-10-06 ENCOUNTER — Other Ambulatory Visit: Payer: Self-pay

## 2019-10-06 ENCOUNTER — Ambulatory Visit: Payer: Medicaid Other

## 2019-10-06 DIAGNOSIS — M25572 Pain in left ankle and joints of left foot: Secondary | ICD-10-CM

## 2019-10-06 DIAGNOSIS — R262 Difficulty in walking, not elsewhere classified: Secondary | ICD-10-CM | POA: Diagnosis present

## 2019-10-06 DIAGNOSIS — M6281 Muscle weakness (generalized): Secondary | ICD-10-CM | POA: Diagnosis present

## 2019-10-06 NOTE — Therapy (Signed)
[facility] [address] [place], [address] Phone: [phone]   Fax:  [phone]  Physical Therapy Treatment  Patient Details  Name: Danielle Pollard MRN: [id number] Date of Birth: Aug 15, 1985 Referring Provider (PT): Daylene Katayama, DPM   Encounter Date: 10/06/2019  PT End of Session - 10/06/19 1500    Visit Number  11    Number of Visits  16    Date for PT Re-Evaluation  10/13/19    Authorization Type  11    Authorization Time Period  of  16 Medicaid 10/11/19    PT Start Time  1500    PT Stop Time  9169    PT Time Calculation (min)  43 min    Activity Tolerance  Patient tolerated treatment well;No increased pain    Behavior During Therapy  WFL for tasks assessed/performed       Past Medical History:  Diagnosis Date  . Anemia   . Asthma    WELL CONTROLLED-LAST USED INHALER IN 2012  . Migraines    MIGRAINES  . Sickle cell trait [facility])     Past Surgical History:  Procedure Laterality Date  . ABDOMINAL HYSTERECTOMY    . CESAREAN SECTION    . DIAGNOSTIC LAPAROSCOPY  2012   Dr. Laurey Morale; lysis of adhesions  . LAPAROSCOPIC HYSTERECTOMY Bilateral 01/05/2018   Procedure: HYSTERECTOMY TOTAL LAPAROSCOPIC, BILATERAL SALPINGECTOMY;  Surgeon: Gae Dry, MD;  Location: [facility] ORS;  Service: Gynecology;  Laterality: Bilateral;  . LAPAROSCOPY N/A 01/05/2018   Procedure: LAPAROSCOPY DIAGNOSTIC;  Surgeon: Gae Dry, MD;  Location: [facility] ORS;  Service: Gynecology;  Laterality: N/A;    There were no vitals filed for this visit.  Subjective Assessment - 10/06/19 1502    Subjective  L foot is ok, no pain or discomfort. Walking is better. Currently wearing crocs because to toe box in the shoe is really tight.    Pertinent History  S/P L sesamoidectomy/1st capsulotomy on 06/17/2019. Has not yet had PT for her procedure. Pt was told to stay off her foot as much as possible for the 6 weeks of therapy. The  area where the surgeon removed the bone is still swollen. Can be on her foot with the orthopedic shoe. Pt states that she cannot actively move her R great toe. She can move it with her hand. The swelling has gone down around her leg. The area around the great toe is still swollen.  Tips of all toes feel like it is pinching at night. L foot bothers her when she walks. Doctor said that the pain in the back of her L gastroc is due to her putting weight on her L LE. Pt is a single parent with a son.    Currently in Pain?  No/denies    Pain Score  0-No pain                               PT Education - 10/06/19 1605    Education Details  ther-ex    Person(s) Educated  Patient    Methods  Explanation;Demonstration;Tactile cues;Verbal cues    Comprehension  Returned demonstration;Verbalized understanding      Objective  Pt currently ambulates with orthopedic shoe with weight on her L lateral foot and heel to decrease pressure on her medial foot. Antalgic, slight decreased stance L LE No abnormal redness, swelling, discoloration, or warmth L leg  compared to R. Pain with L calf squeeze. Pt states US on L leg 07/17/19 was negative for DVT.     Pt observed ambulating in clinic with sneakers today 09/20/2019  Next MD appointment   Manual therapy  Supine gentle great toe distraction to promote flexion ROM  Decreased stiffness palpated  Supine STM L tibialis anterior/extensor muscles  Improved L great toe flexion AAROM    Therapeutic exercise    Supine R great toe flexion and extension multiple times.              Reviewed progress with AROM with pt.    Supine MTP flexion and extension AAROM with PT 10x3 with 5 second holds Supine MTP abduction and adductionAAROM with PT 10x3 with 5 second holds              Supine L great toe and digit 2,finger squeeze 10x2with 5 second holds    Response to treatment No complain of increased  pain trhoughout session   Clinical impression  Pt able to achieve 2-/5 L great toe flexion and extension today after treatment. Improving L great toe (first metatarsophalangeal joint) mobility especially with great toe distraction to decrease capsular tightness and STM to toe extensor muscles to decrease muscle tension and promote toe flexion. No complain of pain throughout session. Pt improving ability flex and extend as well as abduct and adduct her MTP joint. Pt making progress with PT towards goals. Pt will benefit from continued skilled physical therapy services to decrease stiffness, improve ROM, strength, and decrease difficulty with gait    PT Short Term Goals - 09/22/19 1553      PT SHORT TERM GOAL #1   Title  Pt will be independent with her HEP to decrease swelling, improve strength and ability to ambulate.    Baseline  Pt started her HEP (08/09/2019); Pt performing her HEP, more exercises added periodically (08/23/2019), (09/22/2019)    Time  3    Period  Weeks    Status  Partially Met    Target Date  09/01/19        PT Long Term Goals - 09/22/19 1554      PT LONG TERM GOAL #1   Title  Patient will improve bilateral hip extension and abduction strength by 1/2 MMT grade to promote better mechanics at her L foot to promote ability to ambulate with less difficulty.    Baseline  Hip extension 4-/5 R, 4/5 L; hip abduction 4-/5 R, 4/5 L (08/09/2019); hip extension 4/5 R and L, hip abduction 4+/5 R and L (08/23/2019); Hip abduction R 4+/5, L 4/5; hip extension R 4/5, L 4+/5 (09/20/2019)    Time  3    Period  Weeks    Status  Partially Met    Target Date  10/13/19      PT LONG TERM GOAL #2   Title  Pt will improve L great toe flexion and extension strength by at least 1/2 MMT grade to promote ability to ambulate with less difficulty.    Baseline  1/5 L great toe flexion and extension strength (08/09/2019); 1/5 extension, 2-/5 flexion (08/23/2019), 2-/5 flexion and extension after  treatment  (09/22/2019)    Time  3    Status  Partially Met    Target Date  10/13/19      PT LONG TERM GOAL #3   Title  Pt will improve L foot FOTO score by at least 20 points as a demonstration of improved function.  Baseline  L foot FOTO: 28 (08/09/2019); 22 (08/23/2019); 55 (09/22/2019)    Time  3    Period  Weeks    Status  Achieved    Target Date  09/22/19            Plan - 10/06/19 1601    Clinical Impression Statement  Pt able to achieve 2-/5 L great toe flexion and extension today after treatment. Improving L great toe (first metatarsophalangeal joint) mobility especially with great toe distraction to decrease capsular tightness and STM to toe extensor muscles to decrease muscle tension and promote toe flexion. No complain of pain throughout session. Pt improving ability flex and extend as well as abduct and adduct her MTP joint. Pt making progress with PT towards goals. Pt will benefit from continued skilled physical therapy services to decrease stiffness, improve ROM, strength, and decrease difficulty with gait    Personal Factors and Comorbidities  Time since onset of injury/illness/exacerbation;Profession;Finances    Examination-Activity Limitations  Lift;Squat;Stairs;Stand    Stability/Clinical Decision Making  Stable/Uncomplicated    Rehab Potential  Fair    PT Frequency  2x / week    PT Duration  3 weeks    PT Treatment/Interventions  Therapeutic exercise;Balance training;Neuromuscular re-education;Patient/family education;Manual techniques;Scar mobilization;Passive range of motion;Dry needling;Aquatic Therapy;Electrical Stimulation;Iontophoresis 60m/ml Dexamethasone;Ultrasound;Gait training;Stair training;Functional mobility training;Therapeutic activities    PT Next Visit Plan  hip, knee, ankle strengthening/ROM, manual techniques, modalities PRN    PT Home Exercise Plan  ankle pumps, circles, EV/IV, toe flexion/extension    Consulted and Agree with Plan of Care  Patient        Patient will benefit from skilled therapeutic intervention in order to improve the following deficits and impairments:     Visit Diagnosis: Muscle weakness (generalized)  Difficulty in walking, not elsewhere classified  Pain in left ankle and joints of left foot     Problem List Patient Active Problem List   Diagnosis Date Noted  . Migraine 05/10/2014  . Headache 04/18/2014  . Obesity, unspecified 04/18/2014    MJoneen BoersPT, DPT   10/06/2019, 4:07 PM  [facility] [address] [place] [address]Phone: [phone]  Fax:  [phone] Name: BSHANTAL ROANMRN: [id number] of Birth: [birth year]

## 2019-10-10 ENCOUNTER — Ambulatory Visit: Payer: Medicaid Other

## 2019-10-10 ENCOUNTER — Other Ambulatory Visit: Payer: Self-pay

## 2019-10-10 DIAGNOSIS — M6281 Muscle weakness (generalized): Secondary | ICD-10-CM

## 2019-10-10 DIAGNOSIS — M25572 Pain in left ankle and joints of left foot: Secondary | ICD-10-CM

## 2019-10-10 DIAGNOSIS — R262 Difficulty in walking, not elsewhere classified: Secondary | ICD-10-CM

## 2019-10-10 NOTE — Therapy (Signed)
[facility] [address] [place], [address] Phone: [phone]   Fax:  [phone]  Physical Therapy Treatment  Patient Details  Name: Danielle Pollard MRN: [id number] Date of Birth: [birth year] Referring Provider (PT): Daylene Katayama, DPM   Encounter Date: 10/10/2019  PT End of Session - 10/10/19 1514    Visit Number  12    Number of Visits  28    Date for PT Re-Evaluation  11/24/19    Authorization Type  12    Authorization Time Period  of  16 Medicaid 10/11/19    PT Start Time  2836    PT Stop Time  1559    PT Time Calculation (min)  45 min    Activity Tolerance  Patient tolerated treatment well;No increased pain    Behavior During Therapy  WFL for tasks assessed/performed       Past Medical History:  Diagnosis Date  . Anemia   . Asthma    WELL CONTROLLED-LAST USED INHALER IN 2012  . Migraines    MIGRAINES  . Sickle cell trait [facility])     Past Surgical History:  Procedure Laterality Date  . ABDOMINAL HYSTERECTOMY    . CESAREAN SECTION    . DIAGNOSTIC LAPAROSCOPY  2012   Dr. Laurey Morale; lysis of adhesions  . LAPAROSCOPIC HYSTERECTOMY Bilateral 01/05/2018   Procedure: HYSTERECTOMY TOTAL LAPAROSCOPIC, BILATERAL SALPINGECTOMY;  Surgeon: Gae Dry, MD;  Location: [facility] ORS;  Service: Gynecology;  Laterality: Bilateral;  . LAPAROSCOPY N/A 01/05/2018   Procedure: LAPAROSCOPY DIAGNOSTIC;  Surgeon: Gae Dry, MD;  Location: [facility] ORS;  Service: Gynecology;  Laterality: N/A;    There were no vitals filed for this visit.  Subjective Assessment - 10/10/19 1515    Subjective  L foot is good. No pain or discomfort.    Pertinent History  S/P L sesamoidectomy/1st capsulotomy on 06/17/2019. Has not yet had PT for her procedure. Pt was told to stay off her foot as much as possible for the 6 weeks of therapy. The area where the surgeon removed the bone is still swollen. Can be on her foot with the  orthopedic shoe. Pt states that she cannot actively move her R great toe. She can move it with her hand. The swelling has gone down around her leg. The area around the great toe is still swollen.  Tips of all toes feel like it is pinching at night. L foot bothers her when she walks. Doctor said that the pain in the back of her L gastroc is due to her putting weight on her L LE. Pt is a single parent with a son.    Currently in Pain?  No/denies    Pain Score  0-No pain                              Objective  Pt currently ambulates with regular shoe, no antalgic gait pattern.    Next MD appointment 10/14/2019  Manual therapy    Supine gentle great toe distraction to promote flexion ROM  Decreased stiffness palpated  Supine STM L tibialis anterior/extensor muscles             Improved L great toe flexion AAROM    Therapeutic exercise   Manually resisted prone hip extension, S/L hip abduction   Hip extension R 4/5, L 4+/5; hip abduction R 4/5, L 4+/5    Supine  R great toe flexion and extension multiple times.  Reviewed progress with AROM with pt.    Supine MTP flexion and extension AAROM with PT 10x3with 5 second holds Supine MTP abduction and adductionAAROM with PT 10x3with 5 second holds   Supine L great toe and digit 2,finger squeeze 10x3with 5 second holds  Gait with emphasis on push-off L foot    2x/week for 6 weeks   Response to treatment No complain of increased pain trhoughout session   Clinical impression  Pt demonstrates improved L great to flexion and extension strength, being able to actively move her great toe into flexion, extension, abduction and adduction as well as decreased stiffness palpated since initial evaluation. Pt also demonstrates improved ability to ambulate more comfortably with improved foot flat to push-off phase. Pt also demonstrates overall decreased L  foot swelling and improved function since initial evaluation. Pt making very good progress with PT towards goals. Pt will benefit from continued skilled physical therapy services to decrease stiffness, swelling, as well as improve great toe ROM, strength as well as continue progress hip hip strength and overall improve function and ability to ambulate more comfortably.       PT Education - 10/10/19 1515    Education Details  ther-ex    Person(s) Educated  Patient    Methods  Explanation;Demonstration;Tactile cues;Verbal cues    Comprehension  Returned demonstration;Verbalized understanding         PT Short Term Goals - 10/10/19 1853      PT SHORT TERM GOAL #1   Title  Pt will be independent with her HEP to decrease swelling, improve strength and ability to ambulate.    Baseline  Pt started her HEP (08/09/2019); Pt performing her HEP, more exercises added periodically (08/23/2019), (09/22/2019), (10/10/2019)    Time  3    Period  Weeks    Status  Partially Met    Target Date  11/03/19        PT Long Term Goals - 10/10/19 1519      PT LONG TERM GOAL #1   Title  Patient will improve bilateral hip extension and abduction strength by 1/2 MMT grade to promote better mechanics at her L foot to promote ability to ambulate with less difficulty.    Baseline  Hip extension 4-/5 R, 4/5 L; hip abduction 4-/5 R, 4/5 L (08/09/2019); hip extension 4/5 R and L, hip abduction 4+/5 R and L (08/23/2019); Hip abduction R 4+/5, L 4/5; hip extension R 4/5, L 4+/5 (09/20/2019);Hip extension R 4/5, L 4+/5; hip abduction R 4/5, L 4+/5 (10/10/2019)    Time  3    Period  Weeks    Status  Achieved    Target Date  10/13/19      PT LONG TERM GOAL #2   Title  Pt will improve L great toe flexion and extension strength by at least 1/2 MMT grade to promote ability to ambulate with less difficulty.    Baseline  1/5 L great toe flexion and extension strength (08/09/2019); 1/5 extension, 2-/5 flexion (08/23/2019), 2-/5  flexion and extension after treatment  (09/22/2019), 2-/5 great toe flexion and extension (10/10/2019)    Time  3    Status  Achieved    Target Date  10/13/19      PT LONG TERM GOAL #3   Title  Pt will improve L foot FOTO score by at least 20 points as a demonstration of improved function.    Baseline  L  foot FOTO: 28 (08/09/2019); 22 (08/23/2019); 55 (09/22/2019)    Time  3    Period  Weeks    Status  Achieved    Target Date  10/13/19      PT LONG TERM GOAL #4   Title  Pt will improve L great toe flexion and extension strength to 3+/5 or more to promote ability to ambulate with less difficulty.    Baseline  2-/5 L great toe flexion and extension (10/10/2019)    Time  6    Period  Weeks    Status  New    Target Date  11/24/19      PT LONG TERM GOAL #5   Title  Pt will be able to perform stand pivot transfer from chair/mat table <> chair/mat table on a person, providing mod A at least 5 times to promote ability to perform work duties.    Baseline  Unable to assess at the moment (10/10/2019)    Time  6    Period  Weeks    Status  New    Target Date  11/24/19            Plan - 10/10/19 1514    Clinical Impression Statement  Pt demonstrates improved L great to flexion and extension strength, being able to actively move her great toe into flexion, extension, abduction and adduction as well as decreased stiffness palpated since initial evaluation. Pt also demonstrates improved ability to ambulate more comfortably with improved foot flat to push-off phase. Pt also demonstrates overall decreased L foot swelling and improved function since initial evaluation. Pt making very good progress with PT towards goals. Pt will benefit from continued skilled physical therapy services to decrease stiffness, swelling, as well as improve great toe ROM, strength as well as continue progress hip hip strength and overall improve function and ability to ambulate more comfortably.    Personal Factors and  Comorbidities  Time since onset of injury/illness/exacerbation;Profession;Finances    Examination-Activity Limitations  Lift;Squat;Stairs;Stand    Stability/Clinical Decision Making  Stable/Uncomplicated    Clinical Decision Making  Low    Rehab Potential  Fair    PT Frequency  2x / week    PT Duration  6 weeks    PT Treatment/Interventions  Therapeutic exercise;Balance training;Neuromuscular re-education;Patient/family education;Manual techniques;Scar mobilization;Passive range of motion;Dry needling;Aquatic Therapy;Electrical Stimulation;Iontophoresis 34m/ml Dexamethasone;Ultrasound;Gait training;Stair training;Functional mobility training;Therapeutic activities    PT Next Visit Plan  hip, knee, ankle strengthening/ROM, manual techniques, modalities PRN    PT Home Exercise Plan  ankle pumps, circles, EV/IV, toe flexion/extension    Consulted and Agree with Plan of Care  Patient       Patient will benefit from skilled therapeutic intervention in order to improve the following deficits and impairments:     Visit Diagnosis: Muscle weakness (generalized) - Plan: PT plan of care cert/re-cert  Difficulty in walking, not elsewhere classified - Plan: PT plan of care cert/re-cert  Pain in left ankle and joints of left foot - Plan: PT plan of care cert/re-cert     Problem List Patient Active Problem List   Diagnosis Date Noted  . Migraine 05/10/2014  . Headache 04/18/2014  . Obesity, unspecified 04/18/2014    MJoneen BoersPT, DPT   10/10/2019, 7:03 PM  [facility] [address] [place] [address]Phone: [phone]  Fax:  [phone] Name: BBREYAH AKHTERMRN: [id number] of Birth: [birth year]

## 2019-10-14 ENCOUNTER — Encounter: Payer: Self-pay | Admitting: Podiatry

## 2019-10-14 ENCOUNTER — Other Ambulatory Visit: Payer: Self-pay

## 2019-10-14 ENCOUNTER — Ambulatory Visit: Payer: Medicaid Other | Admitting: Podiatry

## 2019-10-14 VITALS — Temp 98.0°F

## 2019-10-14 DIAGNOSIS — M7752 Other enthesopathy of left foot: Secondary | ICD-10-CM

## 2019-10-14 DIAGNOSIS — M2580 Other specified joint disorders, unspecified joint: Secondary | ICD-10-CM

## 2019-10-17 NOTE — Progress Notes (Signed)
   Subjective:  Patient presents today status post fibular sesamoidectomy with 1st MPJ capsulotomy left. DOS: 06/17/2019. She reports some improvement. She notes continued stiffness and intermittent pain that is worse at night. She has been doing ROM exercises. She has been in physical therapy but states it is not helping. Patient is here for further evaluation and treatment.    Past Medical History:  Diagnosis Date  . Anemia   . Asthma    WELL CONTROLLED-LAST USED INHALER IN 2012  . Migraines    MIGRAINES  . Sickle cell trait (HCC)       Objective/Physical Exam Neurovascular status intact.  Skin incisions appear to be well coapted. No sign of infectious process noted. No dehiscence. No active bleeding noted. Moderate edema noted to the surgical extremity. Pain on palpation with limited range of motion noted to the first MPJ left foot secondary to scar tissue.  Assessment: 1. s/p fibular sesamoidectomy with 1st MPJ capsulotomy left. DOS: 06/17/2019 2. 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 1st MPJ capsulitis left    Plan of Care:  1. Patient was evaluated.   2. Injection of 0.5 mLs Celestone Soluspan injected into the 1st MPJ of the left foot.  3. Discontinue physical therapy. Patient states it does not help.  4. Continue ROM exercises.  5. Return to clinic in 4 weeks. If after 2, 3 injections and no improvement, we will discuss surgery.   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    Felecia Shelling, DPM [facility]  Dr. Felecia Shelling, DPM    [address]                                        [place], [place] [address]                Office [phone]  Fax [phone]

## 2019-10-18 ENCOUNTER — Ambulatory Visit: Payer: Medicaid Other

## 2019-10-22 ENCOUNTER — Ambulatory Visit: Payer: Medicaid Other | Attending: Internal Medicine

## 2019-10-22 DIAGNOSIS — Z23 Encounter for immunization: Secondary | ICD-10-CM

## 2019-10-22 NOTE — Progress Notes (Signed)
   Covid-19 Vaccination Clinic  Name:  Danielle Pollard    MRN: [id number] DOB: 01-29-1986  10/22/2019  [name] was observed post Covid-19 immunization for 15 minutes without incident. She was provided with Vaccine Information Sheet and instruction to access the V-Safe system.   [name] was instructed to call 911 with any severe reactions post vaccine: Marland Kitchen Difficulty breathing  . Swelling of face and throat  . A fast heartbeat  . A bad rash all over body  . Dizziness and weakness   Immunizations Administered    Name Date Dose VIS Date Route   Pfizer COVID-19 Vaccine 10/22/2019 12:16 PM 0.3 mL 07/08/2019 Intramuscular   Manufacturer: [employer], [employer]   Lot: [id number]   NDC: [address]-8

## 2019-11-12 ENCOUNTER — Ambulatory Visit: Payer: Medicaid Other | Attending: Internal Medicine

## 2019-11-12 DIAGNOSIS — Z23 Encounter for immunization: Secondary | ICD-10-CM

## 2019-11-12 NOTE — Progress Notes (Signed)
   Covid-19 Vaccination Clinic  Name:  Danielle Pollard    MRN: [id number] DOB: 1986/06/15  11/12/2019  [name] was observed post Covid-19 immunization for 15 minutes without incident. She was provided with Vaccine Information Sheet and instruction to access the V-Safe system.   [name] was instructed to call 911 with any severe reactions post vaccine: Marland Kitchen Difficulty breathing  . Swelling of face and throat  . A fast heartbeat  . A bad rash all over body  . Dizziness and weakness   Immunizations Administered    Name Date Dose VIS Date Route   Pfizer COVID-19 Vaccine 11/12/2019 12:14 PM 0.3 mL 07/08/2019 Intramuscular   Manufacturer: [employer], [employer]   Lot: [id number]   NDC: [address]-7

## 2019-11-15 ENCOUNTER — Other Ambulatory Visit: Payer: Self-pay

## 2019-11-15 ENCOUNTER — Ambulatory Visit: Payer: Medicaid Other | Admitting: Podiatry

## 2019-11-15 DIAGNOSIS — M2580 Other specified joint disorders, unspecified joint: Secondary | ICD-10-CM

## 2019-11-15 DIAGNOSIS — M7752 Other enthesopathy of left foot: Secondary | ICD-10-CM | POA: Diagnosis not present

## 2019-11-15 DIAGNOSIS — M205X2 Other deformities of toe(s) (acquired), left foot: Secondary | ICD-10-CM

## 2019-11-17 NOTE — Progress Notes (Signed)
   Subjective:  Patient presents today status post fibular sesamoidectomy with 1st MPJ capsulotomy left. DOS: 06/17/2019. She reports difficulty with range of motion of the great toe. She reports associated stiffness. She has been doing range of motion exercises as directed. She denies any pain or modifying factors. Patient is here for further evaluation and treatment.    Past Medical History:  Diagnosis Date  . Anemia   . Asthma    WELL CONTROLLED-LAST USED INHALER IN 2012  . Migraines    MIGRAINES  . Sickle cell trait (HCC)       Objective/Physical Exam Neurovascular status intact.  Skin incisions appear to be well coapted. No sign of infectious process noted. No dehiscence. No active bleeding noted. Moderate edema noted to the surgical extremity. Pain on palpation with limited range of motion noted to the first MPJ left foot secondary to scar tissue.  Assessment: 1. s/p fibular sesamoidectomy with 1st MPJ capsulotomy left. DOS: 06/17/2019 2. 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 Patient was evaluated.   2. Continue ROM exercises.  3. May resume full activity with no restrictions.  4. Recommended good shoe gear.  5. Patient states her foot is currently pain free.  6. Return to clinic as needed.   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    Felecia Shelling, DPM [facility]  Dr. Felecia Shelling, DPM    [address]                                        [place], [place] [address]                Office [phone]  Fax [phone]

## 2019-12-12 ENCOUNTER — Telehealth: Payer: Self-pay | Admitting: Podiatry

## 2019-12-12 NOTE — Telephone Encounter (Signed)
Patient called into the [facility] requesting information about how to obtain medical records. Patient was informed that she would need to ome in and sign a ROI  And that the turn around time would be 10-14 business days also that there would be a $5.00 charge to burn the xrays to a CD.

## 2019-12-12 NOTE — Telephone Encounter (Signed)
I'm calling to see how I can request a copy of my medical records including any surgery/operative notes and x-rays. My PCP is sending me to get a 2nd opinion on my toe. If you would please call me back.

## 2020-01-03 ENCOUNTER — Encounter: Payer: Self-pay | Admitting: Podiatry

## 2020-01-03 ENCOUNTER — Other Ambulatory Visit: Payer: Self-pay

## 2020-01-03 ENCOUNTER — Ambulatory Visit: Payer: Medicaid Other | Admitting: Podiatry

## 2020-01-03 DIAGNOSIS — M205X2 Other deformities of toe(s) (acquired), left foot: Secondary | ICD-10-CM | POA: Diagnosis not present

## 2020-01-03 DIAGNOSIS — M7752 Other enthesopathy of left foot: Secondary | ICD-10-CM | POA: Diagnosis not present

## 2020-01-03 DIAGNOSIS — M2580 Other specified joint disorders, unspecified joint: Secondary | ICD-10-CM

## 2020-01-03 MED ORDER — MELOXICAM 15 MG PO TABS: 15.0000 mg | ORAL_TABLET | Freq: Every day | ORAL | 1 refills | Status: AC

## 2020-01-03 NOTE — Progress Notes (Signed)
   Subjective:  Patient presents today status post fibular sesamoidectomy with 1st MPJ capsulotomy left. DOS: 06/17/2019.  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  She wanted to know if her foot is okay for work.  She presents for further treatment evaluation.  No new complaints at this time.  Past Medical History:  Diagnosis Date  . Anemia   . Asthma    WELL CONTROLLED-LAST USED INHALER IN 2012  . Migraines    MIGRAINES  . Sickle cell trait (HCC)       Objective: Physical Exam General: The patient is alert and oriented x3 in no acute distress.  Dermatology: Skin is cool, dry and supple bilateral lower extremities. Negative for open lesions or macerations.  Vascular: Palpable pedal pulses bilaterally. No edema or erythema noted. Capillary refill within normal limits.  Neurological: Epicritic and protective threshold grossly intact bilaterally.   Musculoskeletal Exam: All pedal and ankle joints range of motion within normal limits bilateral with exception of the first MTPJ of the left foot which does have a limited range of motion with plantar flexion. Muscle strength 5/5 in all groups bilateral.   Assessment: 1. s/p fibular sesamoidectomy with 1st MPJ capsulotomy left. DOS: 06/17/2019 2. 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 Patient was evaluated.   2.  Continue wearing good supportive tennis shoes 3.  Prescription for meloxicam.  Take as needed 4.  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 Felecia Shelling, DPM    [address]                                        [place], [place] [address]                Office [phone]  Fax [phone]

## 2020-01-11 ENCOUNTER — Encounter: Payer: Self-pay | Admitting: Emergency Medicine

## 2020-01-11 DIAGNOSIS — Z5321 Procedure and treatment not carried out due to patient leaving prior to being seen by health care provider: Secondary | ICD-10-CM | POA: Diagnosis not present

## 2020-01-11 DIAGNOSIS — R112 Nausea with vomiting, unspecified: Secondary | ICD-10-CM | POA: Diagnosis not present

## 2020-01-11 LAB — COMPREHENSIVE METABOLIC PANEL
ALT: 13 U/L (ref 0–44)
AST: 17 U/L (ref 15–41)
Albumin: 4.4 g/dL (ref 3.5–5.0)
Alkaline Phosphatase: 73 U/L (ref 38–126)
Anion gap: 8 (ref 5–15)
BUN: 6 mg/dL (ref 6–20)
CO2: 26 mmol/L (ref 22–32)
Calcium: 9.2 mg/dL (ref 8.9–10.3)
Chloride: 104 mmol/L (ref 98–111)
Creatinine, Ser: 0.89 mg/dL (ref 0.44–1.00)
GFR calc Af Amer: >60 mL/min (ref >60)
GFR calc non Af Amer: >60 mL/min (ref >60)
Glucose, Bld: 95 mg/dL (ref 70–99)
Potassium: 3.9 mmol/L (ref 3.5–5.1)
Sodium: 138 mmol/L (ref 135–145)
Total Bilirubin: 1.3 mg/dL — ABNORMAL HIGH (ref 0.3–1.2)
Total Protein: 7.7 g/dL (ref 6.5–8.1)

## 2020-01-11 LAB — URINALYSIS, COMPLETE (UACMP) WITH MICROSCOPIC
Bilirubin Urine: NEGATIVE
Glucose, UA: NEGATIVE mg/dL
Hgb urine dipstick: NEGATIVE
Ketones, ur: 20 mg/dL — AB
Leukocytes,Ua: NEGATIVE
Nitrite: NEGATIVE
Protein, ur: NEGATIVE mg/dL
Specific Gravity, Urine: 1.017 (ref 1.005–1.030)
pH: 5 (ref 5.0–8.0)

## 2020-01-11 LAB — LIPASE, BLOOD: Lipase: 20 U/L (ref 11–51)

## 2020-01-11 LAB — CBC
HCT: 38.3 % (ref 36.0–46.0)
Hemoglobin: 13.5 g/dL (ref 12.0–15.0)
MCH: 31.9 pg (ref 26.0–34.0)
MCHC: 35.2 g/dL (ref 30.0–36.0)
MCV: 90.5 fL (ref 80.0–100.0)
Platelets: 281 10*3/uL (ref 150–400)
RBC: 4.23 MIL/uL (ref 3.87–5.11)
RDW: 11.5 % (ref 11.5–15.5)
WBC: 5.2 10*3/uL (ref 4.0–10.5)
nRBC: 0 % (ref 0.0–0.2)

## 2020-01-11 NOTE — ED Triage Notes (Signed)
Pt c/o N/V over the last 4 months. Pt has seen a PCP and referred to GI but has not been able to get appointment. Episodes of N/V occur  after eating.

## 2020-01-12 ENCOUNTER — Emergency Department
Admission: EM | Admit: 2020-01-12 | Discharge: 2020-01-12 | Disposition: A | Discharge status: Home / Self Care | Payer: Medicaid Other | Attending: Emergency Medicine | Admitting: Emergency Medicine

## 2020-01-20 DIAGNOSIS — M79672 Pain in left foot: Secondary | ICD-10-CM | POA: Insufficient documentation

## 2020-01-21 ENCOUNTER — Other Ambulatory Visit: Payer: Self-pay | Admitting: Neurology

## 2020-01-25 ENCOUNTER — Telehealth: Payer: Self-pay | Admitting: Neurology

## 2020-01-25 ENCOUNTER — Telehealth: Payer: Self-pay

## 2020-01-25 MED ORDER — EMGALITY 120 MG/ML ~~LOC~~ SOAJ: 120.0000 mg | SUBCUTANEOUS | 2 refills | Status: DC

## 2020-01-25 NOTE — Telephone Encounter (Signed)
Called pt and informed her that refill for Emgality will only be approved until her 03/2020 dose because she must be seen by provider at scheduled appointment, she verbalized understanding.

## 2020-01-25 NOTE — Telephone Encounter (Signed)
Patient called and scheduled a follow up appointment with Dr. Everlena Cooper on 04/24/20. She has been added to the wait list. Patient is requesting refills on her Emgality 120 MG to last until then.  [facility] [place] [facility] [address]

## 2020-02-10 ENCOUNTER — Other Ambulatory Visit: Payer: Self-pay

## 2020-02-10 ENCOUNTER — Ambulatory Visit (INDEPENDENT_AMBULATORY_CARE_PROVIDER_SITE_OTHER): Payer: Medicaid Other | Admitting: Gastroenterology

## 2020-02-10 ENCOUNTER — Encounter: Payer: Self-pay | Admitting: Gastroenterology

## 2020-02-10 VITALS — BP 118/75 | HR 75 | Temp 98.9°F | Wt 175.1 lb

## 2020-02-10 DIAGNOSIS — R112 Nausea with vomiting, unspecified: Secondary | ICD-10-CM

## 2020-02-10 MED ORDER — OMEPRAZOLE 40 MG PO CPDR
40.0000 mg | DELAYED_RELEASE_CAPSULE | Freq: Two times a day (BID) | ORAL | 2 refills | Status: DC
Start: 2020-02-10 — End: 2020-03-29

## 2020-02-10 NOTE — Progress Notes (Signed)
Arlyss Repress, MD [address]  [address]  [place], [place] [address]  Main: [phone]  Fax: [phone]    Gastroenterology Consultation  Referring Provider:     Center, [facility]* Primary Care Physician:  Center, [facility] Primary Gastroenterologist:  Dr. Arlyss Repress Reason for Consultation:     Intractable nausea and vomiting        HPI:   Danielle Pollard is a 34 y.o. female referred by Dr. Eli Phillips, [facility]  for consultation & management of intractable nausea and vomiting.  Patient reports that she had Covid pneumonia in December 2020, recovered from it in 2 weeks.  Since then, she has been experiencing daily episodes of nausea and vomiting, primarily postprandial on a daily basis.  She lost significant weight due to nausea and vomiting.  She denies hematemesis, melena, abdominal pain, heartburn, dysphagia, abdominal bloating, altered bowel habits.  Her most recent labs are unremarkable.  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 Anemia   . Asthma    WELL CONTROLLED-LAST USED INHALER IN 2012  . Migraines    MIGRAINES  . Sickle cell trait [facility])     Past Surgical History:  Procedure Laterality Date  . ABDOMINAL HYSTERECTOMY    . CESAREAN SECTION    . DIAGNOSTIC LAPAROSCOPY  2012   Dr. Luella Cook; lysis of adhesions  . LAPAROSCOPIC HYSTERECTOMY Bilateral 01/05/2018   Procedure: HYSTERECTOMY TOTAL LAPAROSCOPIC, BILATERAL SALPINGECTOMY;  Surgeon: Nadara Mustard, MD;  Location: [facility] ORS;  Service: Gynecology;  Laterality: Bilateral;  . LAPAROSCOPY N/A 01/05/2018   Procedure: LAPAROSCOPY DIAGNOSTIC;  Surgeon: Nadara Mustard, MD;  Location: [facility] ORS;   Service: Gynecology;  Laterality: N/A;    Current Outpatient Medications:  .  albuterol (PROVENTIL HFA;VENTOLIN HFA) 108 (90 Base) MCG/ACT inhaler, Inhale 2 puffs into the lungs every 6 (six) hours as needed for wheezing or shortness of breath., Disp: , Rfl:  .  Galcanezumab-gnlm (EMGALITY) 120 MG/ML SOAJ, Inject 120 mg into the skin every 30 (thirty) days., Disp: 1 pen, Rfl: 2 .  rizatriptan (MAXALT) 10 MG tablet, Take 1 tablet earliest onset of migraine.  May repeat in 2 hours if needed.  Max 2 tablets in 24 hrs, Disp: 10 tablet, Rfl: 3 .  omeprazole (PRILOSEC) 40 MG capsule, Take 1 capsule (40 mg total) by mouth 2 (two) times daily before a meal., Disp: 60 capsule, Rfl: 2   Family History  Problem Relation Age of Onset  . Hypertension Maternal Grandmother   . Hypertension Maternal Grandfather   . Breast cancer Neg Hx      Social History   Tobacco Use  . Smoking status: Never Smoker  . Smokeless tobacco: Never Used  Vaping Use  . Vaping Use: Never used  Substance Use Topics  . Alcohol use: No  . Drug use: No    Allergies as of 02/10/2020  . (No Known Allergies)    Review of Systems:    All systems reviewed and negative except where noted in HPI.   Physical Exam:  BP 118/75 (BP Location: Left Arm, Patient Position: Sitting, Cuff Size: Normal)   Pulse 75   Temp 98.9 F (37.2 C) (Oral)   Wt  175 lb 2 oz (79.4 kg)   LMP 05/18/2016 (Approximate)   BMI 29.14 kg/m  Patient's last menstrual period was 05/18/2016 (approximate).  General:   Alert,  Well-developed, well-nourished, pleasant and cooperative in NAD Head:  Normocephalic and atraumatic. Eyes:  Sclera clear, no icterus.   Conjunctiva pink. Ears:  Normal auditory acuity. Nose:  No deformity, discharge, or lesions. Mouth:  No deformity or lesions,oropharynx pink & moist. Neck:  Supple; no masses or thyromegaly. Lungs:  Respirations even and unlabored.  Clear throughout to auscultation.   No wheezes, crackles, or  rhonchi. No acute distress. Heart:  Regular rate and rhythm; no murmurs, clicks, rubs, or gallops. Abdomen:  Normal bowel sounds. Soft, non-tender and non-distended without masses, hepatosplenomegaly or hernias noted.  No guarding or rebound tenderness.   Rectal: Not performed Msk:  Symmetrical without gross deformities. Good, equal movement & strength bilaterally. Pulses:  Normal pulses noted. Extremities:  No clubbing or edema.  No cyanosis. Neurologic:  Alert and oriented x3;  grossly normal neurologically. Skin:  Intact without significant lesions or rashes. No jaundice. Psych:  Alert and cooperative. Normal mood and affect.  Imaging Studies: None  Assessment and Plan:   Danielle Pollard is a 34 y.o. 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 twice daily before meals Recommend EGD to evaluate for any gastric outlet obstruction, peptic ulcer disease, perform duodenal and gastric biopsies If EGD is unremarkable and therapeutic trial of PPI fails,  Recommend gastric emptying study and trial of prokinetic agent   Follow up in 2 months   Arlyss Repress, MD

## 2020-02-17 ENCOUNTER — Ambulatory Visit: Payer: Medicaid Other | Admitting: Gastroenterology

## 2020-03-01 ENCOUNTER — Other Ambulatory Visit
Admission: RE | Admit: 2020-03-01 | Discharge: 2020-03-01 | Disposition: A | Discharge status: Home / Self Care | Payer: Medicaid Other | Source: Ambulatory Visit | Attending: Gastroenterology | Admitting: Gastroenterology

## 2020-03-01 ENCOUNTER — Other Ambulatory Visit: Payer: Self-pay

## 2020-03-01 DIAGNOSIS — M2022 Hallux rigidus, left foot: Secondary | ICD-10-CM | POA: Insufficient documentation

## 2020-03-01 DIAGNOSIS — Z01812 Encounter for preprocedural laboratory examination: Secondary | ICD-10-CM | POA: Diagnosis not present

## 2020-03-01 DIAGNOSIS — Z20822 Contact with and (suspected) exposure to covid-19: Secondary | ICD-10-CM | POA: Diagnosis not present

## 2020-03-02 LAB — SARS CORONAVIRUS 2 (TAT 6-24 HRS): SARS Coronavirus 2: NEGATIVE

## 2020-03-05 ENCOUNTER — Other Ambulatory Visit: Payer: Self-pay

## 2020-03-05 ENCOUNTER — Encounter
Admission: RE | Disposition: A | Discharge status: Home / Self Care | Payer: Self-pay | Source: Home / Self Care | Attending: Gastroenterology

## 2020-03-05 ENCOUNTER — Ambulatory Visit
Admission: RE | Admit: 2020-03-05 | Discharge: 2020-03-05 | Disposition: A | Discharge status: Home / Self Care | Payer: Medicaid Other | Attending: Gastroenterology | Admitting: Gastroenterology

## 2020-03-05 ENCOUNTER — Ambulatory Visit: Payer: Medicaid Other | Admitting: Registered Nurse

## 2020-03-05 ENCOUNTER — Encounter: Payer: Self-pay | Admitting: Gastroenterology

## 2020-03-05 DIAGNOSIS — G43909 Migraine, unspecified, not intractable, without status migrainosus: Secondary | ICD-10-CM | POA: Diagnosis not present

## 2020-03-05 DIAGNOSIS — B9681 Helicobacter pylori [H. pylori] as the cause of diseases classified elsewhere: Secondary | ICD-10-CM | POA: Diagnosis not present

## 2020-03-05 DIAGNOSIS — Z8249 Family history of ischemic heart disease and other diseases of the circulatory system: Secondary | ICD-10-CM | POA: Insufficient documentation

## 2020-03-05 DIAGNOSIS — Z90710 Acquired absence of both cervix and uterus: Secondary | ICD-10-CM | POA: Diagnosis not present

## 2020-03-05 DIAGNOSIS — D573 Sickle-cell trait: Secondary | ICD-10-CM | POA: Insufficient documentation

## 2020-03-05 DIAGNOSIS — R1013 Epigastric pain: Secondary | ICD-10-CM | POA: Insufficient documentation

## 2020-03-05 DIAGNOSIS — R112 Nausea with vomiting, unspecified: Secondary | ICD-10-CM | POA: Diagnosis not present

## 2020-03-05 DIAGNOSIS — Z79899 Other long term (current) drug therapy: Secondary | ICD-10-CM | POA: Diagnosis not present

## 2020-03-05 DIAGNOSIS — J45909 Unspecified asthma, uncomplicated: Secondary | ICD-10-CM | POA: Diagnosis not present

## 2020-03-05 DIAGNOSIS — R1110 Vomiting, unspecified: Secondary | ICD-10-CM

## 2020-03-05 DIAGNOSIS — Z9079 Acquired absence of other genital organ(s): Secondary | ICD-10-CM | POA: Insufficient documentation

## 2020-03-05 DIAGNOSIS — K2950 Unspecified chronic gastritis without bleeding: Secondary | ICD-10-CM | POA: Insufficient documentation

## 2020-03-05 DIAGNOSIS — Z8616 Personal history of COVID-19: Secondary | ICD-10-CM | POA: Diagnosis not present

## 2020-03-05 HISTORY — PX: ESOPHAGOGASTRODUODENOSCOPY (EGD) WITH PROPOFOL: SHX5813

## 2020-03-05 SURGERY — ESOPHAGOGASTRODUODENOSCOPY (EGD) WITH PROPOFOL
Anesthesia: General

## 2020-03-05 MED ORDER — DEXMEDETOMIDINE HCL 200 MCG/2ML IV SOLN
INTRAVENOUS | Status: DC | PRN
  Administered 2020-03-05: 20 ug via INTRAVENOUS

## 2020-03-05 MED ORDER — GLYCOPYRROLATE 0.2 MG/ML IJ SOLN
INTRAMUSCULAR | Status: AC
  Filled 2020-03-05: qty 1

## 2020-03-05 MED ORDER — SODIUM CHLORIDE 0.9 % IV SOLN
INTRAVENOUS | Status: DC
  Administered 2020-03-05: 1000 mL via INTRAVENOUS

## 2020-03-05 MED ORDER — GLYCOPYRROLATE 0.2 MG/ML IJ SOLN
INTRAMUSCULAR | Status: DC | PRN
  Administered 2020-03-05: .2 mg via INTRAVENOUS

## 2020-03-05 MED ORDER — PROPOFOL 10 MG/ML IV BOLUS
INTRAVENOUS | Status: DC | PRN
  Administered 2020-03-05: 80 mg via INTRAVENOUS

## 2020-03-05 MED ORDER — MIDAZOLAM HCL 2 MG/2ML IJ SOLN
INTRAMUSCULAR | Status: AC
  Filled 2020-03-05: qty 2

## 2020-03-05 MED ORDER — MIDAZOLAM HCL 2 MG/2ML IJ SOLN
INTRAMUSCULAR | Status: DC | PRN
  Administered 2020-03-05: 2 mg via INTRAVENOUS

## 2020-03-05 MED ORDER — FENTANYL CITRATE (PF) 100 MCG/2ML IJ SOLN: 25.0000 ug | INTRAMUSCULAR | Status: DC | PRN

## 2020-03-05 MED ORDER — LIDOCAINE HCL (CARDIAC) PF 100 MG/5ML IV SOSY
PREFILLED_SYRINGE | INTRAVENOUS | Status: DC | PRN
  Administered 2020-03-05: 100 mg via INTRAVENOUS

## 2020-03-05 MED ORDER — ONDANSETRON HCL 4 MG/2ML IJ SOLN: 4.0000 mg | Freq: Once | INTRAMUSCULAR | Status: DC | PRN

## 2020-03-05 MED ORDER — PROPOFOL 10 MG/ML IV BOLUS
INTRAVENOUS | Status: AC
  Filled 2020-03-05: qty 20

## 2020-03-05 MED ORDER — PROPOFOL 500 MG/50ML IV EMUL
INTRAVENOUS | Status: DC | PRN
  Administered 2020-03-05: 150 ug/kg/min via INTRAVENOUS

## 2020-03-05 NOTE — Op Note (Signed)
[facility] Patient Name: Danielle Pollard Procedure Date: 03/05/2020 8:50 AM MRN: [id number] Account #: [id number] Date of Birth: 12/25/1985 Admit Type: Outpatient Age: 34 Room: [facility] ENDO ROOM 4 Gender: Female Note Status: Finalized Procedure:             Upper GI endoscopy Indications:           Dyspepsia, Nausea with vomiting Providers:             Lin Landsman MD, MD Medicines:             Monitored Anesthesia Care Complications:         No immediate complications. Estimated blood loss: None. Procedure:             Pre-Anesthesia Assessment:                        - Prior to the procedure, a History and Physical was                         performed, and patient medications and allergies were                         reviewed. The patient is competent. The risks and                         benefits of the procedure and the sedation options and                         risks were discussed with the patient. All questions                         were answered and informed consent was obtained.                         Patient identification and proposed procedure were                         verified by the physician, the nurse, the                         anesthesiologist, the anesthetist and the technician                         in the pre-procedure area in the procedure room in the                         endoscopy suite. Mental Status Examination: alert and                         oriented. Airway Examination: normal oropharyngeal                         airway and neck mobility. Respiratory Examination:                         clear to auscultation. CV Examination: normal.                         Prophylactic Antibiotics: The patient does  not require                         prophylactic antibiotics. Prior Anticoagulants: The                         patient has taken no previous anticoagulant or                         antiplatelet  agents. ASA Grade Assessment: II - A                         patient with mild systemic disease. After reviewing                         the risks and benefits, the patient was deemed in                         satisfactory condition to undergo the procedure. The                         anesthesia plan was to use monitored anesthesia care                         (MAC). Immediately prior to administration of                         medications, the patient was re-assessed for adequacy                         to receive sedatives. The heart rate, respiratory                         rate, oxygen saturations, blood pressure, adequacy of                         pulmonary ventilation, and response to care were                         monitored throughout the procedure. The physical                         status of the patient was re-assessed after the                         procedure.                        After obtaining informed consent, the endoscope was                         passed under direct vision. Throughout the procedure,                         the patient's blood pressure, pulse, and oxygen                         saturations were monitored continuously. The Endoscope  was introduced through the mouth, and advanced to the                         second part of duodenum. The upper GI endoscopy was                         accomplished without difficulty. The patient tolerated                         the procedure well. Findings:      The examined duodenum was normal. Biopsies were taken with a cold       forceps for histology.      The entire examined stomach was normal. Biopsies were taken with a cold       forceps for Helicobacter pylori testing.      The cardia and gastric fundus were normal on retroflexion.      Esophagogastric landmarks were identified: the gastroesophageal junction       was found at 35 cm from the incisors.      The gastroesophageal  junction and examined esophagus were normal. Impression:            - Normal examined duodenum. Biopsied.                        - Normal stomach. Biopsied.                        - Esophagogastric landmarks identified.                        - Normal gastroesophageal junction and esophagus. Recommendation:        - Discharge patient to home (with escort).                        - Resume previous diet today.                        - Continue present medications.                        - Await pathology results.                        - Return to my office as previously scheduled. Procedure Code(s):     --- Professional ---                        [phone], Esophagogastroduodenoscopy, flexible,                         transoral; with biopsy, single or multiple Diagnosis Code(s):     --- Professional ---                        R10.13, Epigastric pain                        R11.2, Nausea with vomiting, unspecified CPT copyright 2019 American Medical Association. All rights reserved. The codes documented in this report are preliminary and upon coder review may  be revised to meet current compliance requirements. Dr. Ulyess Mort Raymund Manrique Raeanne Gathers MD,  MD 03/05/2020 9:02:47 AM This report has been signed electronically. Number of Addenda: 0 Note Initiated On: 03/05/2020 8:50 AM Estimated Blood Loss:  Estimated blood loss: none.      [facility]

## 2020-03-05 NOTE — Anesthesia Preprocedure Evaluation (Signed)
Anesthesia Evaluation  Patient identified by MRN, date of birth, ID band Patient awake    Reviewed: Allergy & Precautions, NPO status , Patient's Chart, lab work & pertinent test results  History of Anesthesia Complications Negative for: history of anesthetic complications  Airway Mallampati: III       Dental   Pulmonary asthma , neg sleep apnea, neg COPD, Not current smoker,           Cardiovascular (-) hypertension(-) Past MI and (-) CHF negative cardio ROS  (-) dysrhythmias (-) Valvular Problems/Murmurs     Neuro/Psych  Headaches, neg Seizures negative psych ROS   GI/Hepatic Neg liver ROS, neg GERD  ,  Endo/Other  neg diabetes  Renal/GU negative Renal ROS  negative genitourinary   Musculoskeletal   Abdominal   Peds negative pediatric ROS (+)  Hematology  (+) anemia ,   Anesthesia Other Findings Past Medical History: No date: Anemia No date: Asthma     Comment:  WELL CONTROLLED-LAST USED INHALER IN 2012 No date: Migraines     Comment:  MIGRAINES No date: Sickle cell trait (HCC)  Reproductive/Obstetrics                             Anesthesia Physical  Anesthesia Plan  ASA: II  Anesthesia Plan: General   Post-op Pain Management:    Induction: Intravenous  PONV Risk Score and Plan: 2 and Propofol infusion  Airway Management Planned: Nasal Cannula  Additional Equipment:   Intra-op Plan:   Post-operative Plan:   Informed Consent: I have reviewed the patients History and Physical, chart, labs and discussed the procedure including the risks, benefits and alternatives for the proposed anesthesia with the patient or authorized representative who has indicated his/her understanding and acceptance.     Dental advisory given  Plan Discussed with: CRNA and Surgeon  Anesthesia Plan Comments:         Anesthesia Quick Evaluation

## 2020-03-05 NOTE — H&P (Signed)
Arlyss Repress, MD [address]  [address]  [place], [place] [address]  Main: [phone]  Fax: [phone] Pager: [phone]  Primary Care Physician:  Center, [facility] Primary Gastroenterologist:  Dr. Arlyss Repress  Pre-Procedure History & Physical: HPI:  Danielle Pollard is a 34 y.o. female is here for an endoscopy.   Past Medical History:  Diagnosis Date  . Anemia   . Asthma    WELL CONTROLLED-LAST USED INHALER IN 2012  . Migraines    MIGRAINES  . Sickle cell trait [facility])     Past Surgical History:  Procedure Laterality Date  . ABDOMINAL HYSTERECTOMY    . CESAREAN SECTION    . DIAGNOSTIC LAPAROSCOPY  2012   Dr. Luella Cook; lysis of adhesions  . FOOT TENDON SURGERY    . LAPAROSCOPIC HYSTERECTOMY Bilateral 01/05/2018   Procedure: HYSTERECTOMY TOTAL LAPAROSCOPIC, BILATERAL SALPINGECTOMY;  Surgeon: Nadara Mustard, MD;  Location: [facility] ORS;  Service: Gynecology;  Laterality: Bilateral;  . LAPAROSCOPY N/A 01/05/2018   Procedure: LAPAROSCOPY DIAGNOSTIC;  Surgeon: Nadara Mustard, MD;  Location: [facility] ORS;  Service: Gynecology;  Laterality: N/A;    Prior to Admission medications   Medication Sig Start Date End Date Taking? Authorizing Provider  gabapentin (NEURONTIN) 300 MG capsule Take 300 mg by mouth 3 (three) times daily.   Yes [provider]  albuterol (PROVENTIL HFA;VENTOLIN HFA) 108 (90 Base) MCG/ACT inhaler Inhale 2 puffs into the lungs every 6 (six) hours as needed for wheezing or shortness of breath.    [provider]  Galcanezumab-gnlm (EMGALITY) 120 MG/ML SOAJ Inject 120 mg into the skin every 30 (thirty) days. 01/25/20   Drema Dallas, DO  omeprazole (PRILOSEC) 40 MG capsule Take 1 capsule (40 mg total) by mouth 2 (two) times daily before a meal. 02/10/20   Lorie Cleckley, Loel Dubonnet, MD  rizatriptan (MAXALT) 10 MG tablet Take 1 tablet earliest onset of migraine.  May repeat in 2 hours if needed.  Max 2 tablets in 24 hrs 11/12/18    Drema Dallas, DO    Allergies as of 02/10/2020  . (No Known Allergies)    Family History  Problem Relation Age of Onset  . Hypertension Maternal Grandmother   . Hypertension Maternal Grandfather   . Breast cancer Neg Hx     Social History   Socioeconomic History  . Marital status: Single    Spouse name: Not on file  . Number of children: Not on file  . Years of education: Not on file  . Highest education level: Not on file  Occupational History  . Not on file  Tobacco Use  . Smoking status: Never Smoker  . Smokeless tobacco: Never Used  Vaping Use  . Vaping Use: Never used  Substance and Sexual Activity  . Alcohol use: No  . Drug use: No  . Sexual activity: Yes    Birth control/protection: Surgical    Comment: Hysterectomy  Other Topics Concern  . Not on file  Social History Narrative  . Not on file   Social Determinants of Health   Financial Resource Strain:   . Difficulty of Paying Living Expenses:   Food Insecurity:   . Worried About Programme researcher, broadcasting/film/video in the Last Year:   . Barista in the Last Year:   Transportation Needs:   . Freight forwarder (Medical):   Marland Kitchen Lack of Transportation (Non-Medical):   Physical Activity:   . Days of Exercise per  Week:   . Minutes of Exercise per Session:   Stress:   . Feeling of Stress :   Social Connections:   . Frequency of Communication with Friends and Family:   . Frequency of Social Gatherings with Friends and Family:   . Attends Religious Services:   . Active Member of Clubs or Organizations:   . Attends Banker Meetings:   Marland Kitchen Marital Status:   Intimate Partner Violence:   . Fear of Current or Ex-Partner:   . Emotionally Abused:   Marland Kitchen Physically Abused:   . Sexually Abused:     Review of Systems: See HPI, otherwise negative ROS  Physical Exam: BP 119/76   Pulse 76   Temp 97.7 F (36.5 C) (Tympanic)   Resp 16   Ht 5\' 5"  (1.651 m)   Wt 78.5 kg   LMP 05/18/2016 (Approximate)    SpO2 100%   BMI 28.79 kg/m  General:   Alert,  pleasant and cooperative in NAD Head:  Normocephalic and atraumatic. Neck:  Supple; no masses or thyromegaly. Lungs:  Clear throughout to auscultation.    Heart:  Regular rate and rhythm. Abdomen:  Soft, nontender and nondistended. Normal bowel sounds, without guarding, and without rebound.   Neurologic:  Alert and  oriented x4;  grossly normal neurologically.  Impression/Plan: Danielle Pollard is here for an endoscopy to be performed for 6 months history of intractable nausea, nonbloody nonbilious emesis which started after Covid pneumonia  Risks, benefits, limitations, and alternatives regarding  endoscopy have been reviewed with the patient.  Questions have been answered.  All parties agreeable.   [name], MD  03/05/2020, 8:39 AM

## 2020-03-05 NOTE — Transfer of Care (Signed)
Immediate Anesthesia Transfer of Care Note  Patient: LUANNE KRZYZANOWSKI  Procedure(s) Performed: ESOPHAGOGASTRODUODENOSCOPY (EGD) WITH PROPOFOL (N/A )  Patient Location: PACU and Endoscopy Unit  Anesthesia Type:General  Level of Consciousness: drowsy  Airway & Oxygen Therapy: Patient Spontanous Breathing and Patient connected to nasal cannula oxygen  Post-op Assessment: Report given to RN and Post -op Vital signs reviewed and stable  Post vital signs: Reviewed and stable  Last Vitals:  Vitals Value Taken Time  BP 91/56 03/05/20 0907  Temp 36.3 C 03/05/20 0906  Pulse 69 03/05/20 0907  Resp 20 03/05/20 0907  SpO2 96 % 03/05/20 0907  Vitals shown include unvalidated device data.  Last Pain:  Vitals:   03/05/20 0906  TempSrc: Tympanic  PainSc: Asleep         Complications: No complications documented.

## 2020-03-05 NOTE — Anesthesia Postprocedure Evaluation (Signed)
Anesthesia Post Note  Patient: Danielle Pollard  Procedure(s) Performed: ESOPHAGOGASTRODUODENOSCOPY (EGD) WITH PROPOFOL (N/A )  Patient location during evaluation: Endoscopy Anesthesia Type: General Level of consciousness: awake and alert and oriented Pain management: pain level controlled Vital Signs Assessment: post-procedure vital signs reviewed and stable Respiratory status: spontaneous breathing Cardiovascular status: blood pressure returned to baseline Anesthetic complications: no   No complications documented.   Last Vitals:  Vitals:   03/05/20 0926 03/05/20 0936  BP: 105/75 117/62  Pulse: 60 64  Resp: 18 15  Temp:    SpO2: 100% 100%    Last Pain:  Vitals:   03/05/20 0936  TempSrc:   PainSc: 0-No pain                 Liboria Putnam

## 2020-03-06 ENCOUNTER — Encounter: Payer: Self-pay | Admitting: Gastroenterology

## 2020-03-06 ENCOUNTER — Telehealth: Payer: Self-pay

## 2020-03-06 DIAGNOSIS — Q400 Congenital hypertrophic pyloric stenosis: Secondary | ICD-10-CM

## 2020-03-06 LAB — SURGICAL PATHOLOGY

## 2020-03-06 MED ORDER — CLARITHROMYCIN 500 MG PO TABS
500.0000 mg | ORAL_TABLET | Freq: Two times a day (BID) | ORAL | 0 refills | Status: AC
Start: 2020-03-06 — End: 2020-03-20

## 2020-03-06 MED ORDER — AMOXICILLIN 500 MG PO TABS
1000.0000 mg | ORAL_TABLET | Freq: Two times a day (BID) | ORAL | 0 refills | Status: AC
Start: 2020-03-06 — End: 2020-03-20

## 2020-03-06 MED ORDER — OMEPRAZOLE 40 MG PO CPDR
40.0000 mg | DELAYED_RELEASE_CAPSULE | Freq: Two times a day (BID) | ORAL | 0 refills | Status: DC
Start: 2020-03-06 — End: 2020-03-29

## 2020-03-06 NOTE — Telephone Encounter (Signed)
Patient verbalized understanding of results. Sent medication to pharmacy and order labs for 6 weeks. Patient will have labs done in 6 weeks and pick up medications at pharmacy

## 2020-03-06 NOTE — Telephone Encounter (Signed)
-----   Message from Toney Reil, MD sent at 03/06/2020  2:54 PM EDT ----- Plz send her the prescription for triple therapy to treat H Pylori for 14days  Omeprazole 40mg  BID Clarithromycin 500mg  BID Amoxicillin 1gm BID  Order H Pylori breath test in 4weeks after completing medication to confirm eradication. She should be off prilosec and H2 blocker atleast for 2weeks before the test  Thanks RV

## 2020-03-08 ENCOUNTER — Telehealth: Payer: Self-pay

## 2020-03-08 ENCOUNTER — Encounter: Payer: Self-pay | Admitting: Neurology

## 2020-03-08 ENCOUNTER — Other Ambulatory Visit: Payer: Self-pay | Admitting: Gastroenterology

## 2020-03-08 MED ORDER — ONDANSETRON HCL 4 MG PO TABS
4.0000 mg | ORAL_TABLET | Freq: Three times a day (TID) | ORAL | 0 refills | Status: DC | PRN
Start: 2020-03-08 — End: 2020-04-10

## 2020-03-08 NOTE — Telephone Encounter (Signed)
Let us try Zofran 4 mg every 4-6 hours before taking antibiotics  RV

## 2020-03-08 NOTE — Addendum Note (Signed)
Addended by: Radene Knee L on: 03/08/2020 01:11 PM   Modules accepted: Orders

## 2020-03-08 NOTE — Telephone Encounter (Signed)
Patient verbalized understanding of results  

## 2020-03-08 NOTE — Progress Notes (Addendum)
Channing Mutters Key: [id number] - PA Case ID: [id number] - Rx #: [id number] Need help? Call us at [phone] Outcome Approvedtoday Approved. This drug has been approved. Approved quantity: 1 ML per 30 day(s). You may fill up to a 34 day supply at a retail pharmacy. You may fill up to a 90 day supply for maintenance drugs, please refer to the formulary for details. Please call the pharmacy to process your prescription claim. Drug Emgality 120MG /ML auto-injectors (migraine) Form [facility] Medicaid of [facility] Prior Authorization Request Form [phone] NCPDP) Original Claim Info 75  Valid from 03/08/20 to 06/08/20 per fax received- sent to scanning.

## 2020-03-08 NOTE — Telephone Encounter (Signed)
Patient is eating with amoxicillin and the Biaxan. Patient states she is throwing up after she takes the medication each time. Please advised

## 2020-03-15 ENCOUNTER — Other Ambulatory Visit: Payer: Self-pay

## 2020-03-15 ENCOUNTER — Telehealth: Payer: Self-pay | Admitting: Gastroenterology

## 2020-03-15 MED ORDER — FLUCONAZOLE 150 MG PO TABS: ORAL_TABLET | ORAL | 0 refills | Status: DC

## 2020-03-15 NOTE — Telephone Encounter (Signed)
Please send her prescription for fluconazole 150mg  x 1 dose, repeat after 72 hours if infection persists  RV

## 2020-03-15 NOTE — Telephone Encounter (Signed)
Pt advised the fluconazole 150mg  has been sent to her pharmacy.

## 2020-03-15 NOTE — Telephone Encounter (Signed)
Patient called and stated that the antibiotics Dr. Allegra Lai has prescribed has given her a yeast infection and wants to know what else can be done, per patient. Clinical staff was informed.

## 2020-03-29 ENCOUNTER — Encounter: Payer: Self-pay | Admitting: Neurology

## 2020-03-29 ENCOUNTER — Other Ambulatory Visit: Payer: Self-pay

## 2020-03-29 ENCOUNTER — Ambulatory Visit (INDEPENDENT_AMBULATORY_CARE_PROVIDER_SITE_OTHER): Payer: Medicaid Other | Admitting: Neurology

## 2020-03-29 VITALS — BP 109/75 | HR 77 | Ht 65.0 in | Wt 166.8 lb

## 2020-03-29 DIAGNOSIS — G43009 Migraine without aura, not intractable, without status migrainosus: Secondary | ICD-10-CM | POA: Diagnosis not present

## 2020-03-29 MED ORDER — RIZATRIPTAN BENZOATE 10 MG PO TABS: ORAL_TABLET | ORAL | 11 refills | Status: DC

## 2020-03-29 MED ORDER — EMGALITY 120 MG/ML ~~LOC~~ SOAJ: 120.0000 mg | SUBCUTANEOUS | 11 refills | Status: DC

## 2020-03-29 NOTE — Patient Instructions (Signed)
  1. Emgality every 30 days 2. Take rizatriptan at earliest onset of headache.  May repeat dose once in 2 hours if needed.  Maximum 2 tablets in 24 hours. 3. Limit use of pain relievers to no more than 2 days out of the week.  These medications include acetaminophen, NSAIDs (ibuprofen/Advil/Motrin, naproxen/Aleve, triptans (Imitrex/sumatriptan), Excedrin, and narcotics.  This will help reduce risk of rebound headaches. 4. Be aware of common food triggers:  - Caffeine:  coffee, black tea, cola, Mt. Dew  - Chocolate  - Dairy:  aged cheeses (brie, blue, cheddar, gouda, Lolo, provolone, Coats, Swiss, etc), chocolate milk, buttermilk, sour cream, limit eggs and yogurt  - Nuts, peanut butter  - Alcohol  - Cereals/grains:  FRESH breads (fresh bagels, sourdough, doughnuts), yeast productions  - Processed/canned/aged/cured meats (pre-packaged deli meats, hotdogs)  - MSG/glutamate:  soy sauce, flavor enhancer, pickled/preserved/marinated foods  - Sweeteners:  aspartame (Equal, Nutrasweet).  Sugar and Splenda are okay  - Vegetables:  legumes (lima beans, lentils, snow peas, fava beans, pinto peans, peas, garbanzo beans), sauerkraut, onions, olives, pickles  - Fruit:  avocados, bananas, citrus fruit (orange, lemon, grapefruit), mango  - Other:  Frozen meals, macaroni and cheese 5. Routine exercise 6. Stay adequately hydrated (aim for 64 oz water daily) 7. Keep headache diary 8. Maintain proper stress management 9. Maintain proper sleep hygiene 10. Do not skip meals 11. Consider supplements:  magnesium citrate 400mg  daily, riboflavin 400mg  daily, coenzyme Q10 100mg  three times daily.

## 2020-03-29 NOTE — Progress Notes (Signed)
NEUROLOGY FOLLOW UP OFFICE NOTE  Danielle Pollard [id number]  HISTORY OF PRESENT ILLNESS: Danielle Pollard is a 34 year old woman with migraines, asthma and sickle cell trait who follows up for migraines.  UPDATE: Started Emgality in April 2020.  No migraines since last year.  Rizatriptan effective, lasting several hours.   Current NSAIDS:  ibuprofen Current analgesics:  acetaminophen Current triptans:  rizatriptan 10mg  Current ergotamine:  none Current anti-emetic:  Zofran 4mg  Current muscle relaxants:  none Current anti-anxiolytic:  none Current sleep aide:  none Current Antihypertensive medications:  none Current Antidepressant medications:  none Current Anticonvulsant medications:  none Current anti-CGRP:  Emgality Current Vitamins/Herbal/Supplements:  none Current Antihistamines/Decongestants:  none Other therapy:  none Hormone/birth control:  none  Caffeine:  No coffee.  No recent soda. Diet:  Hydrates.  Does not skip meals. Exercise:  routine Depression:  no; Anxiety:  no Other pain:  no Sleep hygiene:  Good.  HISTORY: Onset:  Middle school Location:  Unilateral (either side) or bifrontal Quality:  Pressure, squeezing, throbbing, pounding Initial Intensity:  10/10.  She denies new headache, thunderclap headache Aura:  no Premonitory Phase:  no Postdrome:  Fatigue or linger dull headache for 1 to 2 days Associated symptoms:  Photophobia, phonophobia, nausea, vomiting, blurred vision, dizzy when severe.  She denies associated unilateral numbness or weakness. Initial Duration:  2-3 days up to a week Initial Frequency:  Frequent until hysterectomy last year and since then once a month (from 2-3 days up to a week) Frequency of abortive medication: usually 3 days a month Triggers:  Unknown Relieving factors:  Sleep in dark and quiet room, laying on a cold floor Activity:  aggravates  Headaches have been worked up with 2 head CTs.  CT Head from 12/17/11  reportedly was normal.  CT head from 06/07/15 was personally reviewed and was also unremarkable.    Past NSAIDS:  naproxen Past analgesics:  Excedrin Past abortive triptans:  Sumatriptan 100mg  Past abortive ergotamine:  none Past muscle relaxants:  none Past anti-emetic:  none Past antihypertensive medications:  none Past antidepressant medications:  nortriptyline Past anticonvulsant medications:  Topiramate, gabapentin (for nerve pain) Past anti-CGRP:  none Past vitamins/Herbal/Supplements:  none Past antihistamines/decongestants:  none Other past therapies: Depo-Provera   Family history of headache:  Maternal aunt  PAST MEDICAL HISTORY: Past Medical History:  Diagnosis Date  . Anemia   . Asthma    WELL CONTROLLED-LAST USED INHALER IN 2012  . Migraines    MIGRAINES  . Sickle cell trait (HCC)     MEDICATIONS: Current Outpatient Medications on File Prior to Visit  Medication Sig Dispense Refill  . albuterol (PROVENTIL HFA;VENTOLIN HFA) 108 (90 Base) MCG/ACT inhaler Inhale 2 puffs into the lungs every 6 (six) hours as needed for wheezing or shortness of breath.    . fluconazole (DIFLUCAN) 150 MG tablet Take one tablet now and one 3 days later. 2 tablet 0  . gabapentin (NEURONTIN) 300 MG capsule Take 300 mg by mouth 3 (three) times daily.    . Galcanezumab-gnlm (EMGALITY) 120 MG/ML SOAJ Inject 120 mg into the skin every 30 (thirty) days. 1 pen 2  . omeprazole (PRILOSEC) 40 MG capsule Take 1 capsule (40 mg total) by mouth 2 (two) times daily before a meal. 60 capsule 2  . omeprazole (PRILOSEC) 40 MG capsule Take 1 capsule (40 mg total) by mouth 2 (two) times daily before a meal for 14 days. 28 capsule 0  . ondansetron (ZOFRAN) 4  MG tablet Take 1 tablet (4 mg total) by mouth every 8 (eight) hours as needed for nausea or vomiting. 30 tablet 0  . rizatriptan (MAXALT) 10 MG tablet Take 1 tablet earliest onset of migraine.  May repeat in 2 hours if needed.  Max 2 tablets in 24 hrs  10 tablet 3   No current facility-administered medications on file prior to visit.    ALLERGIES: No Known Allergies  FAMILY HISTORY: Family History  Problem Relation Age of Onset  . Hypertension Maternal Grandmother   . Hypertension Maternal Grandfather   . Breast cancer Neg Hx     SOCIAL HISTORY: Social History   Socioeconomic History  . Marital status: Single    Spouse name: Not on file  . Number of children: Not on file  . Years of education: Not on file  . Highest education level: Not on file  Occupational History  . Not on file  Tobacco Use  . Smoking status: Never Smoker  . Smokeless tobacco: Never Used  Vaping Use  . Vaping Use: Never used  Substance and Sexual Activity  . Alcohol use: No  . Drug use: No  . Sexual activity: Yes    Birth control/protection: Surgical    Comment: Hysterectomy  Other Topics Concern  . Not on file  Social History Narrative  . Not on file   Social Determinants of Health   Financial Resource Strain:   . Difficulty of Paying Living Expenses: Not on file  Food Insecurity:   . Worried About Programme researcher, broadcasting/film/video in the Last Year: Not on file  . Ran Out of Food in the Last Year: Not on file  Transportation Needs:   . Lack of Transportation (Medical): Not on file  . Lack of Transportation (Non-Medical): Not on file  Physical Activity:   . Days of Exercise per Week: Not on file  . Minutes of Exercise per Session: Not on file  Stress:   . Feeling of Stress : Not on file  Social Connections:   . Frequency of Communication with Friends and Family: Not on file  . Frequency of Social Gatherings with Friends and Family: Not on file  . Attends Religious Services: Not on file  . Active Member of Clubs or Organizations: Not on file  . Attends Banker Meetings: Not on file  . Marital Status: Not on file  Intimate Partner Violence:   . Fear of Current or Ex-Partner: Not on file  . Emotionally Abused: Not on file  .  Physically Abused: Not on file  . Sexually Abused: Not on file    PHYSICAL EXAM: Blood pressure 109/75, pulse 77, height 5\' 5"  (1.651 m), weight 166 lb 12.8 oz (75.7 kg), last menstrual period 05/18/2016, SpO2 98 %. General: No acute distress.  Patient appears well-groomed.   Head:  Normocephalic/atraumatic Eyes:  Fundi examined but not visualized Neck: supple, no paraspinal tenderness, full range of motion Heart:  Regular rate and rhythm Lungs:  Clear to auscultation bilaterally Back: No paraspinal tenderness Neurological Exam: alert and oriented to person, place, and time. Attention span and concentration intact, recent and remote memory intact, fund of knowledge intact.  Speech fluent and not dysarthric, language intact.  CN II-XII intact. Bulk and tone normal, muscle strength 5/5 throughout.  Sensation to light touch, temperature and vibration intact.  Deep tendon reflexes 2+ throughout, toes downgoing.  Finger to nose and heel to shin testing intact.  Gait normal, Romberg negative.  IMPRESSION: Migraine  without aura, without status migrainosus, not intractable  PLAN: 1.  For preventative management, Emgality 2.  For abortive therapy, rizatriptan 10mg  and Zofran 4mg  3.  Limit use of pain relievers to no more than 2 days out of week to prevent risk of rebound or medication-overuse headache. 4.  Keep headache diary 5.  Exercise, hydration, caffeine cessation, sleep hygiene, monitor for and avoid triggers 6.  Follow up 1 year   , DO

## 2020-04-10 ENCOUNTER — Other Ambulatory Visit: Payer: Self-pay

## 2020-04-13 ENCOUNTER — Telehealth: Payer: Self-pay | Admitting: Gastroenterology

## 2020-04-13 ENCOUNTER — Ambulatory Visit: Payer: Medicaid Other | Admitting: Gastroenterology

## 2020-04-13 NOTE — Telephone Encounter (Signed)
Patient called to reschedule appt. Due to car trouble.

## 2020-04-24 ENCOUNTER — Ambulatory Visit: Payer: Medicaid Other | Admitting: Neurology

## 2020-06-01 ENCOUNTER — Other Ambulatory Visit: Payer: Self-pay

## 2020-06-01 ENCOUNTER — Ambulatory Visit (INDEPENDENT_AMBULATORY_CARE_PROVIDER_SITE_OTHER): Payer: Medicaid Other | Admitting: Gastroenterology

## 2020-06-01 ENCOUNTER — Encounter: Payer: Self-pay | Admitting: Gastroenterology

## 2020-06-01 VITALS — BP 118/79 | HR 78 | Temp 97.9°F | Wt 172.1 lb

## 2020-06-01 DIAGNOSIS — R112 Nausea with vomiting, unspecified: Secondary | ICD-10-CM | POA: Diagnosis not present

## 2020-06-01 DIAGNOSIS — K2970 Gastritis, unspecified, without bleeding: Secondary | ICD-10-CM

## 2020-06-01 DIAGNOSIS — B9681 Helicobacter pylori [H. pylori] as the cause of diseases classified elsewhere: Secondary | ICD-10-CM | POA: Diagnosis not present

## 2020-06-01 DIAGNOSIS — Q400 Congenital hypertrophic pyloric stenosis: Secondary | ICD-10-CM

## 2020-06-01 MED ORDER — PROMETHAZINE HCL 12.5 MG PO TABS: 12.5000 mg | ORAL_TABLET | Freq: Four times a day (QID) | ORAL | 0 refills | Status: DC | PRN

## 2020-06-01 NOTE — Progress Notes (Signed)
Arlyss Repress, MD [address]  [address]  [place], [place] [address]  Main: [phone]  Fax: [phone]    Gastroenterology Consultation  Referring Provider:     Center, [facility] Primary Care Physician:  Center, [facility] Primary Gastroenterologist:  Dr. Arlyss Repress Reason for Consultation:     Intractable nausea and vomiting        HPI:   Danielle Pollard is a 34 y.o. female referred by Dr. Eli Phillips, [facility]  for consultation & management of intractable nausea and vomiting.  Patient reports that she had Covid pneumonia in December 2020, recovered from it in 2 weeks.  Since then, she has been experiencing daily episodes of nausea and vomiting, primarily postprandial on a daily basis.  She lost significant weight due to nausea and vomiting.  She denies hematemesis, melena, abdominal pain, heartburn, dysphagia, abdominal bloating, altered bowel habits.  Her most recent labs are unremarkable.  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 pylori infection based on upper endoscopy.  She was given triple therapy for 2 weeks.  Patient is worried that she may not have kept the pills down due to severe nausea and emesis although she was taking Zofran regularly.  Today, she is here for follow-up.  Still has ongoing nausea and emesis.  She is trying to keep food down and able to maintain weight.  She thinks Zofran is not helping.  She did not undergo H. pylori breath test posttreatment  NSAIDs: None  Antiplts/Anticoagulants/Anti thrombotics: None  GI Procedures:  Upper endoscopy 03/05/2020 - Normal examined duodenum. Biopsied. - Normal stomach. Biopsied. - Esophagogastric landmarks identified. - Normal gastroesophageal junction and esophagus.  DIAGNOSIS:  A. DUODENUM; COLD BIOPSY:    - DETACHED FRAGMENT OF EXUDATIVE DEBRIS, SUGGESTIVE OF ULCER.  - FRAGMENTS OF UNREMARKABLE DUODENAL MUCOSA.  - NEGATIVE FOR FEATURES OF CELIAC DISEASE.  - NEGATIVE FOR DYSPLASIA AND MALIGNANCY.   B. STOMACH, RANDOM; COLD BIOPSY:  - CHRONIC ACTIVE HELICOBACTER PYLORI GASTRITIS.  - NEGATIVE FOR INTESTINAL METAPLASIA, DYSPLASIA, AND MALIGNANCY.  She denies family history of GI malignancy  Past Medical History:  Diagnosis Date  . Anemia   . Asthma    WELL CONTROLLED-LAST USED INHALER IN 2012  . Migraines    MIGRAINES  . Sickle cell trait [facility])     Past Surgical History:  Procedure Laterality Date  . ABDOMINAL HYSTERECTOMY    . CESAREAN SECTION    . DIAGNOSTIC LAPAROSCOPY  2012   Dr. Luella Cook; lysis of adhesions  . ESOPHAGOGASTRODUODENOSCOPY (EGD) WITH PROPOFOL N/A 03/05/2020   Procedure: ESOPHAGOGASTRODUODENOSCOPY (EGD) WITH PROPOFOL;  Surgeon: Toney Reil, MD;  Location: [facility] ENDOSCOPY;  Service: Gastroenterology;  Laterality: N/A;  . FOOT TENDON SURGERY    . LAPAROSCOPIC HYSTERECTOMY Bilateral 01/05/2018   Procedure: HYSTERECTOMY TOTAL LAPAROSCOPIC, BILATERAL SALPINGECTOMY;  Surgeon: Nadara Mustard, MD;  Location: [facility] ORS;  Service: Gynecology;  Laterality: Bilateral;  . LAPAROSCOPY N/A 01/05/2018   Procedure: LAPAROSCOPY DIAGNOSTIC;  Surgeon: Nadara Mustard, MD;  Location: [facility] ORS;  Service: Gynecology;  Laterality: N/A;    Current Outpatient Medications:  .  albuterol (PROVENTIL HFA;VENTOLIN HFA) 108 (90 Base) MCG/ACT inhaler, Inhale 2 puffs into the lungs every 6 (six) hours as needed for wheezing or shortness of breath., Disp: , Rfl:  .  diclofenac Sodium (VOLTAREN) 1 % GEL, diclofenac 1 % topical gel  APPLY 2 GRAMS TO THE AFFECTED AREA(S) BY TOPICAL ROUTE 4 TIMES PER DAY, Disp: , Rfl:  .  DULoxetine (CYMBALTA) 30 MG capsule, Take 30 mg by mouth daily., Disp: , Rfl:  .  gabapentin (NEURONTIN) 300 MG capsule, Take 300 mg by mouth 3 (three) times daily., Disp: , Rfl:   .  Galcanezumab-gnlm (EMGALITY) 120 MG/ML SOAJ, Inject 120 mg into the skin every 30 (thirty) days., Disp: 1 mL, Rfl: 11 .  meloxicam (MOBIC) 15 MG tablet, Take 15 mg by mouth daily., Disp: , Rfl:  .  rizatriptan (MAXALT) 10 MG tablet, Take 1 tablet earliest onset of migraine.  May repeat in 2 hours if needed.  Max 2 tablets in 24 hrs, Disp: 10 tablet, Rfl: 11 .  promethazine (PHENERGAN) 12.5 MG tablet, Take 1 tablet (12.5 mg total) by mouth every 6 (six) hours as needed for nausea or vomiting., Disp: 30 tablet, Rfl: 0   Family History  Problem Relation Age of Onset  . Hypertension Maternal Grandmother   . Hypertension Maternal Grandfather   . Breast cancer Neg Hx      Social History   Tobacco Use  . Smoking status: Never Smoker  . Smokeless tobacco: Never Used  Vaping Use  . Vaping Use: Never used  Substance Use Topics  . Alcohol use: No  . Drug use: No    Allergies as of 06/01/2020  . (No Known Allergies)    Review of Systems:    All systems reviewed and negative except where noted in HPI.   Physical Exam:  BP 118/79 (BP Location: Left Arm, Patient Position: Sitting, Cuff Size: Normal)   Pulse 78   Temp 97.9 F (36.6 C) (Oral)   Wt 172 lb 2 oz (78.1 kg)   LMP 05/18/2016 (Approximate)   BMI 28.64 kg/m  Patient's last menstrual period was 05/18/2016 (approximate).  General:   Alert,  Well-developed, well-nourished, pleasant and cooperative in NAD Head:  Normocephalic and atraumatic. Eyes:  Sclera clear, no icterus.   Conjunctiva pink. Ears:  Normal auditory acuity. Nose:  No deformity, discharge, or lesions. Mouth:  No deformity or lesions,oropharynx pink & moist. Neck:  Supple; no masses or thyromegaly. Lungs:  Respirations even and unlabored.  Clear throughout to auscultation.   No wheezes, crackles, or rhonchi. No acute distress. Heart:  Regular rate and rhythm; no murmurs, clicks, rubs, or gallops. Abdomen:  Normal bowel sounds. Soft, mild epigastric  tenderness and non-distended without masses, hepatosplenomegaly or hernias noted.  No guarding or rebound tenderness.   Rectal: Not performed Msk:  Symmetrical without gross deformities. Good, equal movement & strength bilaterally. Pulses:  Normal pulses noted. Extremities:  No clubbing or edema.  No cyanosis. Neurologic:  Alert and oriented x3;  grossly normal neurologically. Skin:  Intact without significant lesions or rashes. No jaundice. Psych:  Alert and cooperative. Normal mood and affect.  Imaging Studies: None  Assessment and Plan:   Danielle Pollard is a 34 y.o. female with no significant past medical history is seen in consultation for approximately 6 months history of intractable nausea, nonbloody nonbilious emesis which started after Covid pneumonia, found to have Helicobacter pylori infection based on upper endoscopy, biopsy-proven  H. pylori gastritis: Persistent symptoms S/p triple therapy Recommend H. pylori breath test, if positive recommend Pylera treatment Trial of Phenergan 12.5 mg every 6 hours as needed for nausea, cautioned about drowsiness as a side effect and to  avoid on her work days  Follow up in 2 months   Arlyss Repress, MD

## 2020-06-03 LAB — H. PYLORI BREATH TEST: H pylori Breath Test: NEGATIVE

## 2020-06-04 ENCOUNTER — Telehealth: Payer: Self-pay

## 2020-06-04 DIAGNOSIS — R112 Nausea with vomiting, unspecified: Secondary | ICD-10-CM

## 2020-06-04 NOTE — Telephone Encounter (Signed)
-----   Message from Toney Reil, MD sent at 06/04/2020  9:39 AM EST ----- Breath test came back normal which means there is no bacterial infection of the stomach any more.  I recommend gastric emptying study to assess if her stomach is emptying normally since she has persistent nausea and vomiting, if patient is agreeable  RV

## 2020-06-04 NOTE — Telephone Encounter (Signed)
Called and left a message for call back  

## 2020-06-04 NOTE — Telephone Encounter (Signed)
Patient verbalized understanding of results. She is okay with scheduled the gastric emptying study.  Patient needs to hold GI medications 8 hours before the scan.  Scheduled patient for 08/11/2019 this was first available. Nothing to eat or drink after midnight. [facility] to medical mall at 7:30am for a 8:00am scan. Patient verbalized understanding of instruction

## 2020-06-19 ENCOUNTER — Encounter: Payer: Self-pay | Admitting: Neurology

## 2020-06-19 NOTE — Progress Notes (Addendum)
Channing Mutters Key: [id number] - PA Case ID: [id number] - Rx #: [id number] Need help? Call us at [phone] Outcome Approvedtoday Approved. This drug has been approved. Approved quantity: 1 <> per 30 day(s). You may fill up to a 34 day supply at a retail pharmacy. You may fill up to a 90 day supply for maintenance drugs, please refer to the formulary for details. Please call the pharmacy to process your prescription claim. Drug Emgality 120MG /ML auto-injectors (migraine) Form [facility] Medicaid of [facility] Prior Authorization Request Form [phone] NCPDP) Original Claim Info 75  Approval valid from 06/19/20 to 09/19/20.

## 2020-08-10 ENCOUNTER — Ambulatory Visit
Admission: RE | Admit: 2020-08-10 | Discharge: 2020-08-10 | Disposition: A | Discharge status: Home / Self Care | Payer: Medicaid Other | Source: Ambulatory Visit | Attending: Gastroenterology | Admitting: Gastroenterology

## 2020-08-10 ENCOUNTER — Telehealth: Payer: Self-pay

## 2020-08-10 ENCOUNTER — Other Ambulatory Visit: Payer: Self-pay

## 2020-08-10 DIAGNOSIS — R112 Nausea with vomiting, unspecified: Secondary | ICD-10-CM | POA: Insufficient documentation

## 2020-08-10 MED ORDER — METOCLOPRAMIDE HCL 5 MG PO TABS: 5.0000 mg | ORAL_TABLET | Freq: Three times a day (TID) | ORAL | 0 refills | Status: DC

## 2020-08-10 MED ORDER — TECHNETIUM TC 99M SULFUR COLLOID
2.0000 | Freq: Once | INTRAVENOUS | Status: AC | PRN
  Administered 2020-08-10: 1.83 via ORAL

## 2020-08-10 NOTE — Telephone Encounter (Signed)
Let's start her on Reglan 5 mg 3 times daily before each meal and 1 at bedtime She can also take it as needed before each meal Give her only 30 pills  I will see her for follow-up as scheduled  Rohini Vanga

## 2020-08-10 NOTE — Telephone Encounter (Signed)
Patient verbalized understanding and sent medication to the pharmacy  

## 2020-08-10 NOTE — Telephone Encounter (Signed)
Danielle Pollard attempted her Gastric Empty Study this am. She made it 1/2 way through eating the meal and she began vomiting. All that she had eaten came back up. It was less then 5 minutes after she began eating that she began vomiting. She stated that this is often what happens. Wanted to keep you informed, she tried really hard to get this test done but was unable to do so.

## 2020-08-10 NOTE — Addendum Note (Signed)
Addended by: Radene Knee L on: 08/10/2020 11:10 AM   Modules accepted: Orders

## 2020-08-16 ENCOUNTER — Encounter: Payer: Self-pay | Admitting: Gastroenterology

## 2020-08-16 ENCOUNTER — Other Ambulatory Visit: Payer: Self-pay

## 2020-08-16 ENCOUNTER — Telehealth (INDEPENDENT_AMBULATORY_CARE_PROVIDER_SITE_OTHER): Payer: Medicaid Other | Admitting: Gastroenterology

## 2020-08-16 DIAGNOSIS — R112 Nausea with vomiting, unspecified: Secondary | ICD-10-CM

## 2020-08-16 MED ORDER — PANTOPRAZOLE SODIUM 40 MG PO TBEC: 40.0000 mg | DELAYED_RELEASE_TABLET | Freq: Every day | ORAL | 1 refills | Status: DC

## 2020-08-16 MED ORDER — METOCLOPRAMIDE HCL 5 MG PO TABS: 5.0000 mg | ORAL_TABLET | Freq: Three times a day (TID) | ORAL | 0 refills | Status: DC | PRN

## 2020-08-16 NOTE — Progress Notes (Signed)
Danielle Donath, MD [address]  [address]  [place], [place] [address]  Main: [phone]  Fax: [phone]    Gastroenterology Consultation Tele Visit  Referring Provider:     [facility], [facility] Primary Care Physician:  Center, [facility] Primary Gastroenterologist:  Dr. Arlyss Repress Reason for Consultation:     Dyspepsia, intractable nausea and vomiting        HPI:   Danielle Pollard is a 35 y.o. female referred by Dr. Eli Phillips, [facility]  for consultation & management of Dyspepsia, intractable nausea and vomiting  Virtual Visit via Telephone Note  I connected with Danielle Pollard on 08/16/20 at  2:15 PM EST by telephone and verified that I am speaking with the correct person using two identifiers.   I discussed the limitations, risks, security and privacy concerns of performing an evaluation and management service by telephone and the availability of in person appointments. I also discussed with the patient that there may be a patient responsible charge related to this service. The patient expressed understanding and agreed to proceed.  Location of the Patient: home  Location of the provider: Office  Persons participating in the visit: patient and provider only  History of Present Illness: Patient has been diagnosed with H. pylori infection based on upper endoscopy.  She was given triple therapy for 2 weeks.  Patient is worried that she may not have kept the pills down due to severe nausea and emesis although she was taking Zofran regularly.  Today, she is here for follow-up.  Still has ongoing nausea and emesis.  She is trying to keep food down and able to maintain weight.  She thinks Zofran is not helping.  She did not undergo H. pylori breath test posttreatment  Follow-up visit 08/16/2020 Patient did not tolerate gastric emptying study due to refractory nausea and vomiting. I empirically started her on Reglan 5 mg before each meal  and at bedtime. Since then, she reports that she did have only 2 episodes of emesis. However, she has been experiencing diarrhea likely secondary to side effect from Reglan. She is otherwise doing well and denies any other GI symptoms. She is taking meloxicam  NSAIDs: Meloxicam  Antiplts/Anticoagulants/Anti thrombotics: None  GI Procedures:  Upper endoscopy 03/05/2020 - Normal examined duodenum. Biopsied. - Normal stomach. Biopsied. - Esophagogastric landmarks identified. - Normal gastroesophageal junction and esophagus.  DIAGNOSIS:  A. DUODENUM; COLD BIOPSY:  - DETACHED FRAGMENT OF EXUDATIVE DEBRIS, SUGGESTIVE OF ULCER.  - FRAGMENTS OF UNREMARKABLE DUODENAL MUCOSA.  - NEGATIVE FOR FEATURES OF CELIAC DISEASE.  - NEGATIVE FOR DYSPLASIA AND MALIGNANCY.   B. STOMACH, RANDOM; COLD BIOPSY:  - CHRONIC ACTIVE HELICOBACTER PYLORI GASTRITIS.  - NEGATIVE FOR INTESTINAL METAPLASIA, DYSPLASIA, AND MALIGNANCY.  She denies family history of GI malignancy  Past Medical History:  Diagnosis Date  . Anemia   . Asthma    WELL CONTROLLED-LAST USED INHALER IN 2012  . Migraines    MIGRAINES  . Obesity, unspecified 04/18/2014  . Sickle cell trait [facility])     Past Surgical History:  Procedure Laterality Date  . ABDOMINAL HYSTERECTOMY    . CESAREAN SECTION    . DIAGNOSTIC LAPAROSCOPY  2012   Dr. Luella Cook; lysis of adhesions  . ESOPHAGOGASTRODUODENOSCOPY (EGD) WITH PROPOFOL N/A 03/05/2020   Procedure: ESOPHAGOGASTRODUODENOSCOPY (EGD) WITH PROPOFOL;  Surgeon: Toney Reil, MD;  Location: [facility] ENDOSCOPY;  Service: Gastroenterology;  Laterality: N/A;  . FOOT TENDON SURGERY    .  LAPAROSCOPIC HYSTERECTOMY Bilateral 01/05/2018   Procedure: HYSTERECTOMY TOTAL LAPAROSCOPIC, BILATERAL SALPINGECTOMY;  Surgeon: Nadara Mustard, MD;  Location: [facility] ORS;  Service: Gynecology;  Laterality: Bilateral;  . LAPAROSCOPY N/A 01/05/2018   Procedure: LAPAROSCOPY DIAGNOSTIC;  Surgeon: Nadara Mustard, MD;  Location:  [facility] ORS;  Service: Gynecology;  Laterality: N/A;    Current Outpatient Medications:  .  albuterol (PROVENTIL HFA;VENTOLIN HFA) 108 (90 Base) MCG/ACT inhaler, Inhale 2 puffs into the lungs every 6 (six) hours as needed for wheezing or shortness of breath., Disp: , Rfl:  .  diclofenac Sodium (VOLTAREN) 1 % GEL, diclofenac 1 % topical gel  APPLY 2 GRAMS TO THE AFFECTED AREA(S) BY TOPICAL ROUTE 4 TIMES PER DAY, Disp: , Rfl:  .  DULoxetine (CYMBALTA) 30 MG capsule, Take 30 mg by mouth daily., Disp: , Rfl:  .  gabapentin (NEURONTIN) 300 MG capsule, Take 300 mg by mouth 3 (three) times daily., Disp: , Rfl:  .  Galcanezumab-gnlm (EMGALITY) 120 MG/ML SOAJ, Inject 120 mg into the skin every 30 (thirty) days., Disp: 1 mL, Rfl: 11 .  meloxicam (MOBIC) 15 MG tablet, Take 15 mg by mouth daily., Disp: , Rfl:  .  metoCLOPramide (REGLAN) 5 MG tablet, Take 1 tablet (5 mg total) by mouth every 8 (eight) hours as needed for refractory nausea / vomiting., Disp: 90 tablet, Rfl: 0 .  pantoprazole (PROTONIX) 40 MG tablet, Take 1 tablet (40 mg total) by mouth daily before breakfast., Disp: 90 tablet, Rfl: 1 .  promethazine (PHENERGAN) 12.5 MG tablet, Take 1 tablet (12.5 mg total) by mouth every 6 (six) hours as needed for nausea or vomiting., Disp: 30 tablet, Rfl: 0 .  rizatriptan (MAXALT) 10 MG tablet, Take 1 tablet earliest onset of migraine.  May repeat in 2 hours if needed.  Max 2 tablets in 24 hrs, Disp: 10 tablet, Rfl: 11    Social History   Tobacco Use  . Smoking status: Never Smoker  . Smokeless tobacco: Never Used  Vaping Use  . Vaping Use: Never used  Substance Use Topics  . Alcohol use: No  . Drug use: No    Allergies as of 08/16/2020  . (No Known Allergies)     Imaging Studies: Reviewed  Assessment and Plan:   Danielle Pollard is a 35 y.o. female with no significant past medical history is seen in consultation for approximately 6 months history of intractable nausea, nonbloody nonbilious  emesis which started after Covid pneumonia, found to have Helicobacter pylori infection based on upper endoscopy, biopsy-proven  Intractable nausea and vomiting, history of H. pylori gastritis, S/p triple therapy, H. pylori breath test is negative. Due to persistent symptoms, I ordered gastric emptying study, which patient did not tolerate it. Therefore, I started her empirically on Reglan 5 mg before each meal and her symptoms have significantly improved. Advised her to switch to Reglan as needed Will repeat nuclear medicine gastric emptying study in 1 to 2 months Also, recommend to start Protonix 40 mg daily while patient is on meloxicam  Follow Up Instructions:   I discussed the assessment and treatment plan with the patient. The patient was provided an opportunity to ask questions and all were answered. The patient agreed with the plan and demonstrated an understanding of the instructions.   The patient was advised to call back or seek an in-person evaluation if the symptoms worsen or if the condition fails to improve as anticipated.  I provided 12 minutes of non-face-to-face time during this  encounter.   Follow up in 2 to 3 months   Arlyss Repress, MD

## 2020-08-17 ENCOUNTER — Other Ambulatory Visit: Payer: Self-pay

## 2020-08-20 ENCOUNTER — Ambulatory Visit (LOCAL_COMMUNITY_HEALTH_CENTER): Payer: Self-pay

## 2020-08-20 ENCOUNTER — Other Ambulatory Visit: Payer: Self-pay

## 2020-08-20 DIAGNOSIS — Z111 Encounter for screening for respiratory tuberculosis: Secondary | ICD-10-CM

## 2020-08-23 ENCOUNTER — Other Ambulatory Visit: Payer: Self-pay

## 2020-08-23 ENCOUNTER — Ambulatory Visit (LOCAL_COMMUNITY_HEALTH_CENTER): Payer: Medicaid Other

## 2020-08-23 DIAGNOSIS — Z111 Encounter for screening for respiratory tuberculosis: Secondary | ICD-10-CM

## 2020-08-23 LAB — TB SKIN TEST
Induration: 0 mm
TB Skin Test: NEGATIVE

## 2020-08-31 ENCOUNTER — Other Ambulatory Visit: Payer: Self-pay | Admitting: Family Medicine

## 2020-08-31 DIAGNOSIS — R928 Other abnormal and inconclusive findings on diagnostic imaging of breast: Secondary | ICD-10-CM

## 2020-09-14 ENCOUNTER — Encounter: Payer: Self-pay | Admitting: Neurology

## 2020-09-14 NOTE — Progress Notes (Signed)
Channing Mutters Key: [id number] - [place] Case ID: [id number] Need help? Call us at [phone] Outcome Approvedtoday Approved. This drug has been approved. Approved quantity: 1 <> per 30 day(s). You may fill up to a 34 day supply. You may fill up to a 90 day supply for maintenance drugs, please refer to the formulary for details. You filled this medication on <<09/07/2020>> for quantity of <<1 Pens>> for a <<30>> day supply at <>. Your next refill is on <<09/30/2020>>. Drug Emgality 120MG /ML auto-injectors (migraine) Form [facility] Medicaid of [facility] Prior Authorization Request Form [phone] NCPDP)

## 2020-11-27 ENCOUNTER — Encounter: Payer: Self-pay | Admitting: Gastroenterology

## 2020-11-27 ENCOUNTER — Ambulatory Visit: Payer: Medicaid Other | Admitting: Gastroenterology

## 2020-11-27 ENCOUNTER — Other Ambulatory Visit: Payer: Self-pay

## 2021-01-03 ENCOUNTER — Ambulatory Visit: Payer: Medicaid Other | Admitting: Gastroenterology

## 2021-01-24 ENCOUNTER — Other Ambulatory Visit: Payer: Self-pay

## 2021-01-24 ENCOUNTER — Emergency Department
Admission: EM | Admit: 2021-01-24 | Discharge: 2021-01-25 | Disposition: A | Discharge status: Home / Self Care | Payer: Medicaid Other | Attending: Emergency Medicine | Admitting: Emergency Medicine

## 2021-01-24 DIAGNOSIS — X58XXXA Exposure to other specified factors, initial encounter: Secondary | ICD-10-CM | POA: Insufficient documentation

## 2021-01-24 DIAGNOSIS — T782XXA Anaphylactic shock, unspecified, initial encounter: Secondary | ICD-10-CM | POA: Insufficient documentation

## 2021-01-24 DIAGNOSIS — R0602 Shortness of breath: Secondary | ICD-10-CM | POA: Diagnosis not present

## 2021-01-24 DIAGNOSIS — J45909 Unspecified asthma, uncomplicated: Secondary | ICD-10-CM | POA: Diagnosis not present

## 2021-01-24 DIAGNOSIS — Z79899 Other long term (current) drug therapy: Secondary | ICD-10-CM | POA: Diagnosis not present

## 2021-01-24 DIAGNOSIS — R519 Headache, unspecified: Secondary | ICD-10-CM | POA: Diagnosis not present

## 2021-01-24 DIAGNOSIS — R220 Localized swelling, mass and lump, head: Secondary | ICD-10-CM | POA: Diagnosis present

## 2021-01-24 DIAGNOSIS — R202 Paresthesia of skin: Secondary | ICD-10-CM | POA: Insufficient documentation

## 2021-01-24 DIAGNOSIS — L509 Urticaria, unspecified: Secondary | ICD-10-CM | POA: Diagnosis not present

## 2021-01-24 MED ORDER — METHYLPREDNISOLONE SODIUM SUCC 125 MG IJ SOLR
125.0000 mg | Freq: Once | INTRAMUSCULAR | Status: AC
  Administered 2021-01-24: 125 mg via INTRAVENOUS
  Filled 2021-01-24: qty 2

## 2021-01-24 MED ORDER — EPINEPHRINE 0.3 MG/0.3ML IJ SOAJ: 0.3000 mg | INTRAMUSCULAR | 1 refills | Status: AC | PRN

## 2021-01-24 MED ORDER — ONDANSETRON HCL 4 MG/2ML IJ SOLN
4.0000 mg | Freq: Once | INTRAMUSCULAR | Status: AC
  Administered 2021-01-24: 4 mg via INTRAVENOUS
  Filled 2021-01-24: qty 2

## 2021-01-24 MED ORDER — PREDNISONE 20 MG PO TABS: 40.0000 mg | ORAL_TABLET | Freq: Every day | ORAL | 0 refills | Status: AC

## 2021-01-24 MED ORDER — ACETAMINOPHEN 500 MG PO TABS
1000.0000 mg | ORAL_TABLET | Freq: Once | ORAL | Status: AC
  Administered 2021-01-24: 1000 mg via ORAL
  Filled 2021-01-24: qty 2

## 2021-01-24 NOTE — ED Provider Notes (Addendum)
[facility] Emergency Department Provider Note  ____________________________________________   Event Date/Time   First MD Initiated Contact with Patient 01/24/21 2205     (approximate)  I have reviewed the triage vital signs and the nursing notes.   HISTORY  Chief Complaint Allergic Reaction    HPI Danielle Pollard is a 35 y.o. female with asthma who comes in with concern for allergic reaction.  Patient reports being outside and felt swelling fly up her nose immediately started having hives, trouble breathing, mouth tingling.  Patient reports having this happen once previously but did not get prescribed an EpiPen because they were not sure what she was allergic to.  She has not followed up with an allergy doctor.  Patient was given epi, Pepcid, Benadryl with EMS.  No steroids were given.  Patient reports some nausea, headache but states that the shortness of breath and the difficulty breathing is better with these medications.  Onset just prior to arrival.  Reaction was severe.          Past Medical History:  Diagnosis Date   Anemia    Asthma    WELL CONTROLLED-LAST USED INHALER IN 2012   Migraines    MIGRAINES   Obesity, unspecified 04/18/2014   Sickle cell trait [facility])     Patient Active Problem List   Diagnosis Date Noted   Intractable vomiting    Acquired hallux rigidus of left foot 03/01/2020   Pain in left foot 01/20/2020   Migraine 05/10/2014   Headache 04/18/2014    Past Surgical History:  Procedure Laterality Date   ABDOMINAL HYSTERECTOMY     CESAREAN SECTION     DIAGNOSTIC LAPAROSCOPY  2012   Dr. Luella Cook; lysis of adhesions   ESOPHAGOGASTRODUODENOSCOPY (EGD) WITH PROPOFOL N/A 03/05/2020   Procedure: ESOPHAGOGASTRODUODENOSCOPY (EGD) WITH PROPOFOL;  Surgeon: Toney Reil, MD;  Location: [facility] ENDOSCOPY;  Service: Gastroenterology;  Laterality: N/A;   FOOT TENDON SURGERY     LAPAROSCOPIC HYSTERECTOMY Bilateral 01/05/2018    Procedure: HYSTERECTOMY TOTAL LAPAROSCOPIC, BILATERAL SALPINGECTOMY;  Surgeon: Nadara Mustard, MD;  Location: [facility] ORS;  Service: Gynecology;  Laterality: Bilateral;   LAPAROSCOPY N/A 01/05/2018   Procedure: LAPAROSCOPY DIAGNOSTIC;  Surgeon: Nadara Mustard, MD;  Location: [facility] ORS;  Service: Gynecology;  Laterality: N/A;    Prior to Admission medications   Medication Sig Start Date End Date Taking? Authorizing Provider  albuterol (PROVENTIL HFA;VENTOLIN HFA) 108 (90 Base) MCG/ACT inhaler Inhale 2 puffs into the lungs every 6 (six) hours as needed for wheezing or shortness of breath.    [provider]  diclofenac Sodium (VOLTAREN) 1 % GEL diclofenac 1 % topical gel  APPLY 2 GRAMS TO THE AFFECTED AREA(S) BY TOPICAL ROUTE 4 TIMES PER DAY    [provider]  DULoxetine (CYMBALTA) 30 MG capsule Take 30 mg by mouth daily. 03/16/20   [provider]  gabapentin (NEURONTIN) 400 MG capsule Take 400 mg by mouth 4 (four) times daily. 09/07/20   [provider]  Galcanezumab-gnlm (EMGALITY) 120 MG/ML SOAJ Inject 120 mg into the skin every 30 (thirty) days. 03/29/20   Drema Dallas, DO  meloxicam (MOBIC) 15 MG tablet Take 15 mg by mouth daily. 02/28/20   [provider]  metoCLOPramide (REGLAN) 5 MG tablet Take 1 tablet (5 mg total) by mouth every 8 (eight) hours as needed for refractory nausea / vomiting. 08/16/20 09/15/20  Toney Reil, MD  pantoprazole (PROTONIX) 40 MG tablet Take 1 tablet (  40 mg total) by mouth daily before breakfast. 08/16/20 11/14/20  Toney Reil, MD  promethazine (PHENERGAN) 12.5 MG tablet Take 1 tablet (12.5 mg total) by mouth every 6 (six) hours as needed for nausea or vomiting. 06/01/20   Toney Reil, MD  rizatriptan (MAXALT) 10 MG tablet Take 1 tablet earliest onset of migraine.  May repeat in 2 hours if needed.  Max 2 tablets in 24 hrs 03/29/20   Drema Dallas, DO    Allergies Patient has no known allergies.  Family  History  Problem Relation Age of Onset   Hypertension Maternal Grandmother    Hypertension Maternal Grandfather    Breast cancer Neg Hx     Social History Social History   Tobacco Use   Smoking status: Never   Smokeless tobacco: Never  Vaping Use   Vaping Use: Never used  Substance Use Topics   Alcohol use: No   Drug use: No      Review of Systems Constitutional: No fever/chills Eyes: No visual changes. ENT: No sore throat. Cardiovascular: Denies chest pain. Respiratory: Positive shortness of Gastrointestinal: No abdominal pain.  Positive nausea, no vomiting.  No diarrhea.  No constipation. Genitourinary: Negative for dysuria. Musculoskeletal: Negative for back pain. Skin: Positive rash Neurological: Positive headache, focal weakness or numbness. All other ROS negative ____________________________________________   PHYSICAL EXAM:  VITAL SIGNS: ED Triage Vitals  Enc Vitals Group     BP 01/24/21 2212 122/86     Pulse Rate 01/24/21 2212 (!) 107     Resp 01/24/21 2212 (!) 22     Temp 01/24/21 2212 98.7 F (37.1 C)     Temp Source 01/24/21 2212 Oral     SpO2 01/24/21 2212 96 %     Weight 01/24/21 2213 191 lb 9.3 oz (86.9 kg)     Height 01/24/21 2213 5\' 4"  (1.626 m)     Head Circumference --      Peak Flow --      Pain Score 01/24/21 2213 3     Pain Loc --      Pain Edu? --      Excl. in GC? --     Constitutional: Alert and oriented. Well appearing and in no acute distress. Eyes: Conjunctivae are normal. EOMI. some swelling noted to her eyelids Head: Atraumatic. Nose: No congestion/rhinnorhea.  No bug noted in nasal passage Mouth/Throat: Mucous membranes are moist.   Neck: No stridor. Trachea Midline. FROM Cardiovascular: Normal rate, regular rhythm. Grossly normal heart sounds.  Good peripheral circulation. Respiratory: Normal respiratory effort.  No retractions. Lungs CTAB. Gastrointestinal: Soft and nontender. No distention. No abdominal bruits.   Musculoskeletal: No lower extremity tenderness nor edema.  No joint effusions. Neurologic:  Normal speech and language. No gross focal neurologic deficits are appreciated.  Skin:  Skin is warm, dry and intact.  Per EMS rash is now resolved Psychiatric: Mood and affect are normal. Speech and behavior are normal. GU: Deferred   ____________________________________________   LABS (all labs ordered are listed, but only abnormal results are displayed)  Labs Reviewed - No data to display ____________________________________________   ED ECG REPORT I, 2214, the attending physician, personally viewed and interpreted this ECG.     PROCEDURES  Procedure(s) performed (including Critical Care):  Procedures   ____________________________________________   INITIAL IMPRESSION / ASSESSMENT AND PLAN / ED COURSE  MARKEL MERGENTHALER was evaluated in Emergency Department on 01/24/2021 for the symptoms described in the history of present  illness. She was evaluated in the context of the global COVID-19 pandemic, which necessitated consideration that the patient might be at risk for infection with the SARS-CoV-2 virus that causes COVID-19. Institutional protocols and algorithms that pertain to the evaluation of patients at risk for COVID-19 are in a state of rapid change based on information released by regulatory bodies including the CDC and federal and state organizations. These policies and algorithms were followed during the patient's care in the ED.     Patient is a 35 year old who comes in with concern for allergic reaction/anaphylaxis.  Patient received epi around 10 PM so will need to be monitored until 1-2pm.  We will give patient steroids, Zofran, Tylenol to help with the headache.  We will continue to closely monitor on the cardiac monitor to see if she needs repeat epi dosing.  Patient is not on lisinopril to suggest ACE induced angioedema.  Patient denies any chest pain or any  other concerns.  Patient will be scribed EpiPen, steroids for discharge.  Patient will be handed off to oncoming team pending reassessment        ____________________________________________   FINAL CLINICAL IMPRESSION(S) / ED DIAGNOSES   Final diagnoses:  Anaphylaxis, initial encounter      MEDICATIONS GIVEN DURING THIS VISIT:  Medications  methylPREDNISolone sodium succinate (SOLU-MEDROL) 125 mg/2 mL injection 125 mg (125 mg Intravenous Given 01/24/21 2221)  ondansetron (ZOFRAN) injection 4 mg (4 mg Intravenous Given 01/24/21 2221)  acetaminophen (TYLENOL) tablet 1,000 mg (1,000 mg Oral Given 01/24/21 2221)     ED Discharge Orders          Ordered    EPINEPHrine 0.3 mg/0.3 mL IJ SOAJ injection  As needed        01/24/21 2220    predniSONE (DELTASONE) 20 MG tablet  Daily with breakfast        01/24/21 2220             Note:  This document was prepared using Dragon voice recognition software and may include unintentional dictation errors.    Concha Se, MD 01/24/21 2221    Concha Se, MD 01/24/21 2222

## 2021-01-24 NOTE — Discharge Instructions (Addendum)
Your doctor your primary care doctor about refer you to an allergy doctor.  Use the EpiPen for potential future reaction.  SHe should carry this around with you.  Take the steroids starting tomorrow to help prevent rebound reaction.  Return to the ER for worsening symptoms or any other concerns

## 2021-01-24 NOTE — ED Triage Notes (Signed)
Pt was outside and felt something fly up her nose and immediately started having hives, trouble breathing, mouth tingling. Pt states this happened one other time but does not know the trigger.

## 2021-01-25 NOTE — ED Provider Notes (Signed)
-----------------------------------------   1:07 AM on 01/25/2021 -----------------------------------------  Awaken patient from sleep to assess her.  There is no posterior oropharyngeal edema.  Room air saturations 98%.  No hives.  Overall patient is feeling better.  Will discharge home per previous providers plan with prescriptions for EpiPen and steroids.  Strict return precautions given.  Patient verbalizes understanding agrees with plan of care.   Irean Hong, MD 01/25/21 [phone]

## 2021-02-11 ENCOUNTER — Other Ambulatory Visit: Payer: Self-pay

## 2021-02-12 ENCOUNTER — Ambulatory Visit: Payer: Medicaid Other | Admitting: Gastroenterology

## 2021-03-27 NOTE — Progress Notes (Deleted)
Virtual Visit via Video Note The purpose of this virtual visit is to provide medical care while limiting exposure to the novel coronavirus.    Consent was obtained for video visit:  Yes.   Answered questions that patient had about telehealth interaction:  Yes.   I discussed the limitations, risks, security and privacy concerns of performing an evaluation and management service by telemedicine. I also discussed with the patient that there may be a patient responsible charge related to this service. The patient expressed understanding and agreed to proceed.  Pt location: Home Physician Location: office Name of referring provider:  [facility], [facility] I connected with Danielle Pollard at patients initiation/request on 03/29/2021 at  8:30 AM EDT by video enabled telemedicine application and verified that I am speaking with the correct person using two identifiers. Pt MRN:  [id number] Pt DOB:  Aug 22, 1985 Video Participants:  Danielle Pollard  Assessment and Plan:   Migraine without aura, without status migrainosus, not intractable  Migraine prevention:  *** Migraine rescue:  *** Limit use of pain relievers to no more than 2 days out of week to prevent risk of rebound or medication-overuse headache. Keep headache diary Follow up ***   History of Present Illness:  Danielle Pollard is a 35 year old woman with migraines, asthma and sickle cell trait who follows up for migraines.   UPDATE: *** Current NSAIDS:  ibuprofen Current analgesics:  acetaminophen Current triptans:  rizatriptan 10mg  Current ergotamine:  none Current anti-emetic:  Zofran 4mg  Current muscle relaxants:  none Current anti-anxiolytic:  none Current sleep aide:  none Current Antihypertensive medications:  none Current Antidepressant medications:  none Current Anticonvulsant medications:  none Current anti-CGRP:  Emgality Current Vitamins/Herbal/Supplements:  none Current Antihistamines/Decongestants:   none Other therapy:  none Hormone/birth control:  none   Caffeine:  No coffee.  No recent soda. Diet:  Hydrates.  Does not skip meals. Exercise:  routine Depression:  no; Anxiety:  no Other pain:  no Sleep hygiene:  Good.   HISTORY:  Onset:  Middle school Location:  Unilateral (either side) or bifrontal Quality:  Pressure, squeezing, throbbing, pounding Initial Intensity:  10/10.  She denies new headache, thunderclap headache Aura:  no Premonitory Phase:  no Postdrome:  Fatigue or linger dull headache for 1 to 2 days Associated symptoms:  Photophobia, phonophobia, nausea, vomiting, blurred vision, dizzy when severe.  She denies associated unilateral numbness or weakness. Initial Duration:  2-3 days up to a week Initial Frequency:  Frequent until hysterectomy last year and since then once a month (from 2-3 days up to a week) Frequency of abortive medication: usually 3 days a month Triggers:  Unknown Relieving factors:  Sleep in dark and quiet room, laying on a cold floor Activity:  aggravates   Headaches have been worked up with 2 head CTs.  CT Head from 12/17/11 reportedly was normal.  CT head from 06/07/15 was personally reviewed and was also unremarkable.      Past NSAIDS:  naproxen Past analgesics:  Excedrin Past abortive triptans:  Sumatriptan 100mg  Past abortive ergotamine:  none Past muscle relaxants:  none Past anti-emetic:  none Past antihypertensive medications:  none Past antidepressant medications:  nortriptyline Past anticonvulsant medications:  Topiramate, gabapentin (for nerve pain) Past anti-CGRP:  none Past vitamins/Herbal/Supplements:  none Past antihistamines/decongestants:  none Other past therapies: Depo-Provera     Family history of headache:  Maternal aunt  Past Medical History: Past Medical History:  Diagnosis Date   Anemia  Asthma    WELL CONTROLLED-LAST USED INHALER IN 2012   Migraines    MIGRAINES   Obesity, unspecified 04/18/2014    Sickle cell trait (HCC)     Medications: Outpatient Encounter Medications as of 03/29/2021  Medication Sig   albuterol (PROVENTIL HFA;VENTOLIN HFA) 108 (90 Base) MCG/ACT inhaler Inhale 2 puffs into the lungs every 6 (six) hours as needed for wheezing or shortness of breath.   diclofenac (VOLTAREN) 75 MG EC tablet diclofenac sodium 75 mg tablet,delayed release  Take 1 tablet twice a day by oral route for 30 days.   diclofenac Sodium (VOLTAREN) 1 % GEL diclofenac 1 % topical gel  APPLY 2 GRAMS TO THE AFFECTED AREA(S) BY TOPICAL ROUTE 4 TIMES PER DAY   DULoxetine (CYMBALTA) 30 MG capsule Take 30 mg by mouth daily.   gabapentin (NEURONTIN) 400 MG capsule Take 400 mg by mouth 4 (four) times daily.   Galcanezumab-gnlm (EMGALITY) 120 MG/ML SOAJ Inject 120 mg into the skin every 30 (thirty) days.   ibuprofen (ADVIL) 800 MG tablet ibuprofen 800 mg tablet  TAKE 1 TABLET BY MOUTH WITH FOOD EVERY 8 HOURS AS NEEDED FOR PAIN   meloxicam (MOBIC) 15 MG tablet Take 15 mg by mouth daily.   metoCLOPramide (REGLAN) 5 MG tablet Take 1 tablet (5 mg total) by mouth every 8 (eight) hours as needed for refractory nausea / vomiting.   pantoprazole (PROTONIX) 40 MG tablet Take 1 tablet (40 mg total) by mouth daily before breakfast.   promethazine (PHENERGAN) 12.5 MG tablet Take 1 tablet (12.5 mg total) by mouth every 6 (six) hours as needed for nausea or vomiting.   rizatriptan (MAXALT) 10 MG tablet Take 1 tablet earliest onset of migraine.  May repeat in 2 hours if needed.  Max 2 tablets in 24 hrs   tiZANidine (ZANAFLEX) 4 MG tablet tizanidine 4 mg tablet  TAKE 1 TABLET BY MOUTH TWICE A DAY AS NEEDED FOR MUSCLE SPASMS   No facility-administered encounter medications on file as of 03/29/2021.    Allergies: No Known Allergies  Family History: Family History  Problem Relation Age of Onset   Hypertension Maternal Grandmother    Hypertension Maternal Grandfather    Breast cancer Neg Hx     Observations/Objective:    *** No acute distress.  Alert and oriented.  Speech fluent and not dysarthric.  Language intact.  Eyes orthophoric on primary gaze.  Face symmetric.   Follow Up Instructions:    -I discussed the assessment and treatment plan with the patient. The patient was provided an opportunity to ask questions and all were answered. The patient agreed with the plan and demonstrated an understanding of the instructions.   The patient was advised to call back or seek an in-person evaluation if the symptoms worsen or if the condition fails to improve as anticipated.   Cira Servant, DO

## 2021-03-29 ENCOUNTER — Other Ambulatory Visit: Payer: Self-pay

## 2021-03-29 ENCOUNTER — Telehealth: Payer: Medicaid Other | Admitting: Neurology

## 2021-03-29 ENCOUNTER — Encounter: Payer: Self-pay | Admitting: Neurology

## 2021-04-27 ENCOUNTER — Other Ambulatory Visit: Payer: Self-pay | Admitting: Neurology

## 2021-05-06 ENCOUNTER — Other Ambulatory Visit: Payer: Self-pay | Admitting: Family Medicine

## 2021-05-06 DIAGNOSIS — R928 Other abnormal and inconclusive findings on diagnostic imaging of breast: Secondary | ICD-10-CM

## 2021-06-07 ENCOUNTER — Ambulatory Visit
Admission: RE | Admit: 2021-06-07 | Discharge: 2021-06-07 | Disposition: A | Discharge status: Home / Self Care | Payer: Medicaid Other | Source: Ambulatory Visit | Attending: Family Medicine | Admitting: Family Medicine

## 2021-06-07 ENCOUNTER — Other Ambulatory Visit: Payer: Self-pay | Admitting: Family Medicine

## 2021-06-07 ENCOUNTER — Other Ambulatory Visit: Payer: Self-pay

## 2021-06-07 DIAGNOSIS — R928 Other abnormal and inconclusive findings on diagnostic imaging of breast: Secondary | ICD-10-CM

## 2021-06-18 ENCOUNTER — Other Ambulatory Visit: Payer: Self-pay | Admitting: Family Medicine

## 2021-06-18 DIAGNOSIS — R928 Other abnormal and inconclusive findings on diagnostic imaging of breast: Secondary | ICD-10-CM

## 2021-06-18 DIAGNOSIS — N6489 Other specified disorders of breast: Secondary | ICD-10-CM

## 2021-06-27 ENCOUNTER — Ambulatory Visit
Admission: RE | Admit: 2021-06-27 | Discharge: 2021-06-27 | Disposition: A | Discharge status: Home / Self Care | Payer: Medicaid Other | Source: Ambulatory Visit | Attending: Family Medicine | Admitting: Family Medicine

## 2021-06-27 ENCOUNTER — Other Ambulatory Visit: Payer: Self-pay

## 2021-06-27 DIAGNOSIS — N6489 Other specified disorders of breast: Secondary | ICD-10-CM

## 2021-06-27 DIAGNOSIS — R928 Other abnormal and inconclusive findings on diagnostic imaging of breast: Secondary | ICD-10-CM | POA: Insufficient documentation

## 2021-06-27 HISTORY — PX: BREAST BIOPSY: SHX20

## 2021-06-28 LAB — SURGICAL PATHOLOGY

## 2021-07-01 ENCOUNTER — Encounter: Payer: Self-pay | Admitting: Neurology

## 2021-07-23 ENCOUNTER — Other Ambulatory Visit: Payer: Self-pay | Admitting: Gastroenterology

## 2021-07-23 DIAGNOSIS — R112 Nausea with vomiting, unspecified: Secondary | ICD-10-CM

## 2021-07-24 ENCOUNTER — Other Ambulatory Visit: Payer: Self-pay

## 2021-08-04 IMAGING — MR MR FOOT*L* W/O CM
7 series · 40 of 40 positions shown · non-contrast
Comparison: None.

CLINICAL DATA: Left foot pain for 1 year.

EXAM:
MRI OF THE LEFT FOOT WITHOUT CONTRAST
TECHNIQUE: Multiplanar, multisequence MR imaging of the left forefoot was
performed. No intravenous contrast was administered.

[Series 4: T1 · coronal · left · 3.0mm · 0.38mm/px · 7 of 45 slices shown (1 of 2)]
[im 1/45]
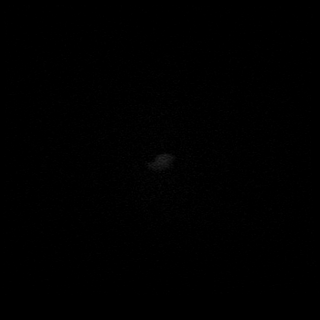
[im 8/45]
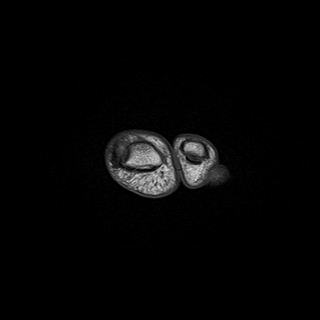
[im 15/45]
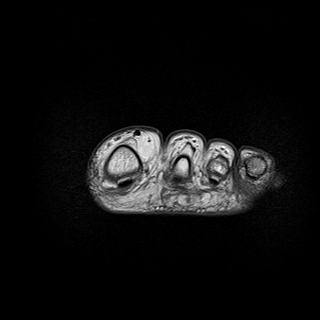
[im 23/45]
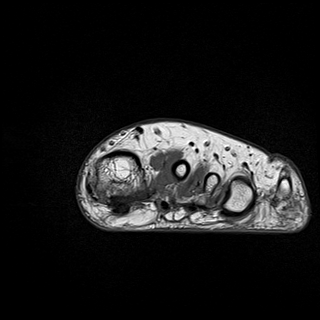
[im 30/45]
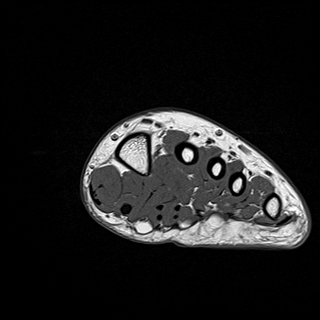
[im 37/45]
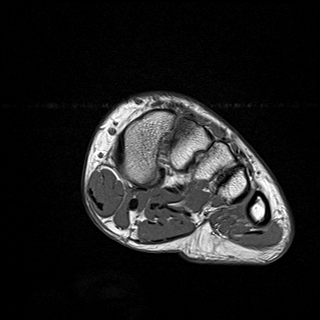
[im 45/45]
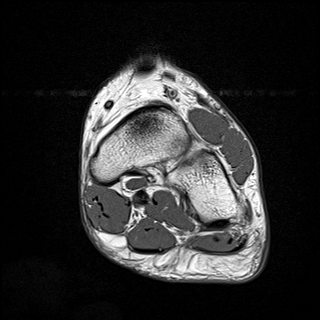

[Series 5: T2 · coronal · left · 3.0mm · 0.38mm/px · 8 of 45 slices shown (1 of 4)]
[im 1/45]
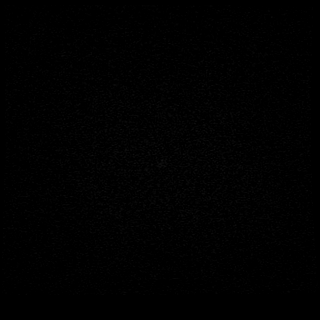
[im 7/45]
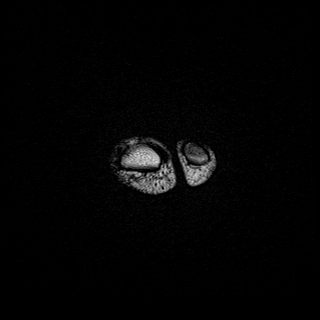
[im 13/45]
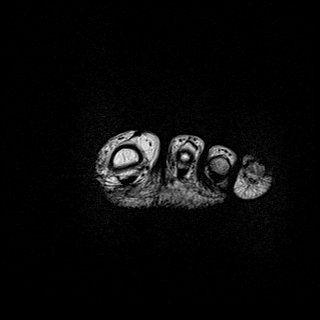
[im 19/45]
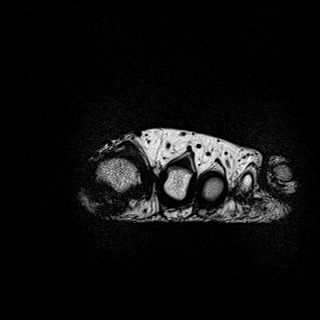
[im 26/45]
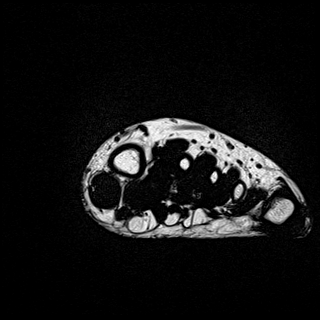
[im 32/45]
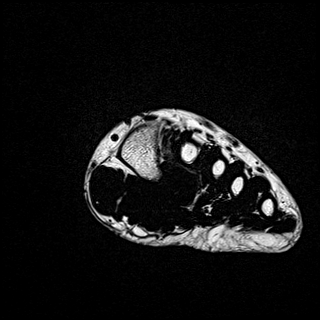
[im 38/45]
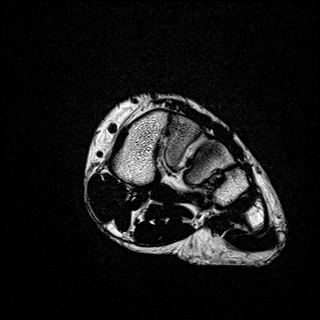
[im 45/45]
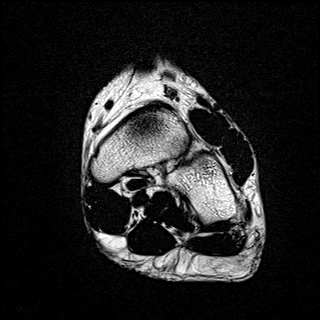

[Series 6: T2 · coronal · left · 3.0mm · 0.38mm/px · 8 of 45 slices shown (2 of 4)]
[im 1/45]
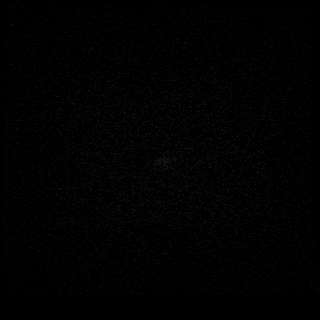
[im 7/45]
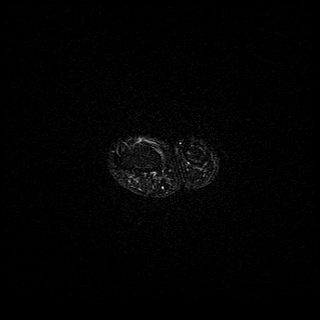
[im 13/45]
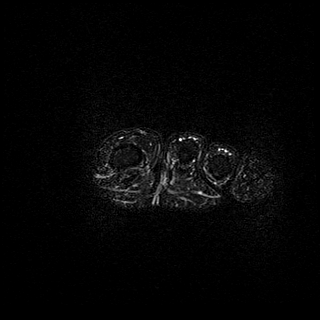
[im 19/45]
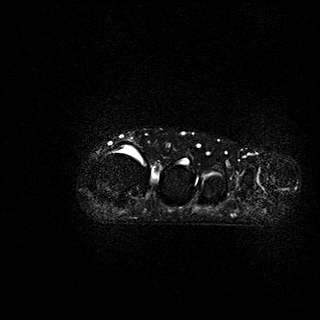
[im 26/45]
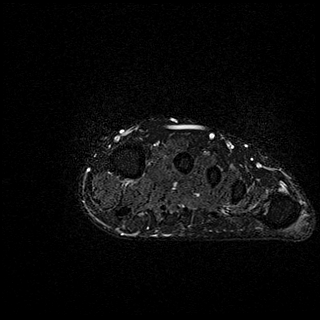
[im 32/45]
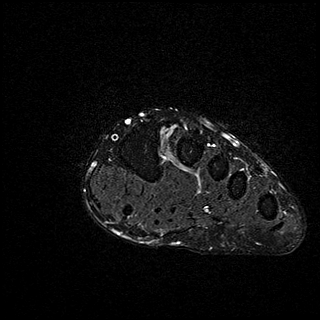
[im 38/45]
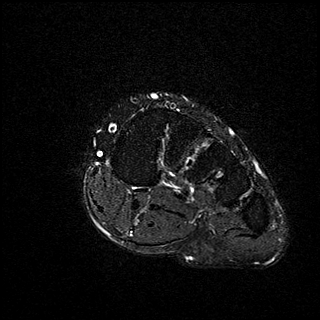
[im 45/45]
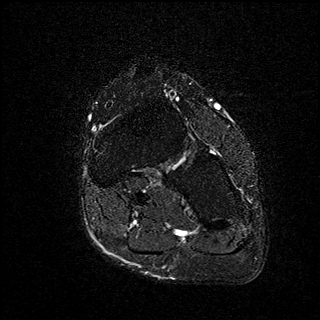

[Series 7: T1 · axial · left · 3.0mm · 0.70mm/px · z∈[-113,-39]mm · 4 of 20 slices shown (2 of 2)]
[im 1/20]
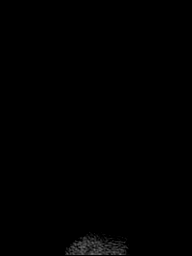
[im 7/20]
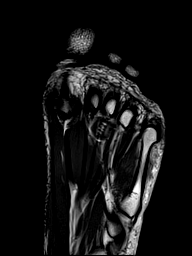
[im 13/20]
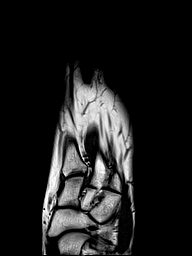
[im 20/20]
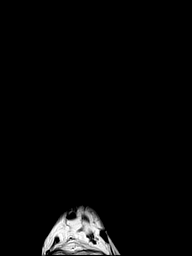

[Series 8: T2 · axial · left · 3.0mm · 0.70mm/px · z∈[-113,-39]mm · 4 of 20 slices shown (3 of 4)]
[im 1/20]
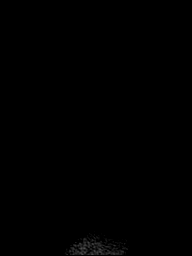
[im 7/20]
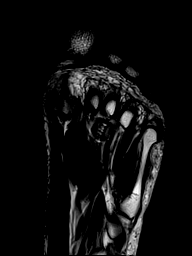
[im 13/20]
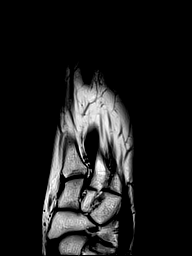
[im 20/20]
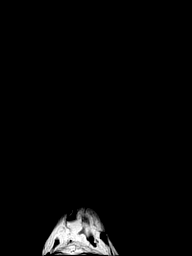

[Series 9: T2 · axial · left · 3.0mm · 0.70mm/px · z∈[-113,-39]mm · 4 of 20 slices shown (4 of 4)]
[im 1/20]
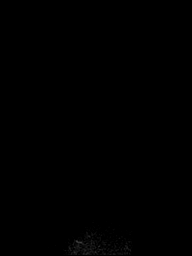
[im 7/20]
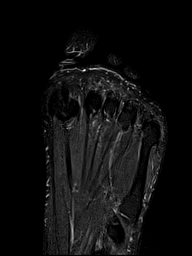
[im 13/20]
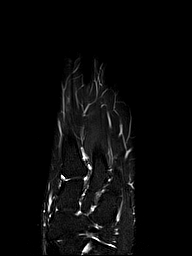
[im 20/20]
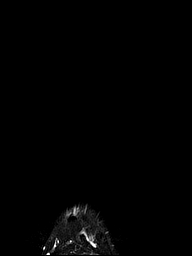

[Series 10: STIR · sagittal · left · 3.0mm · 0.62mm/px · 5 of 27 slices shown]
[im 1/27]
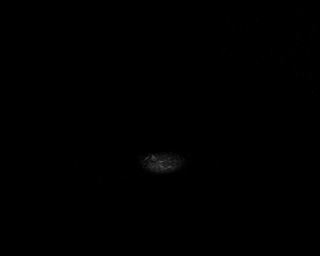
[im 7/27]
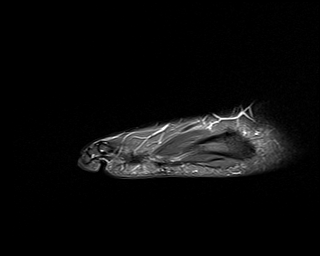
[im 14/27]
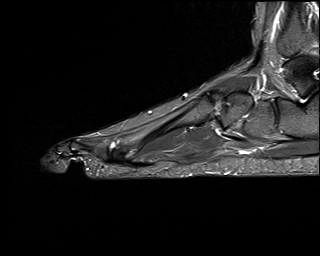
[im 20/27]
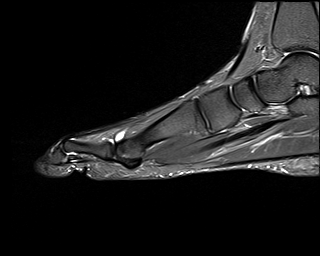
[im 27/27]
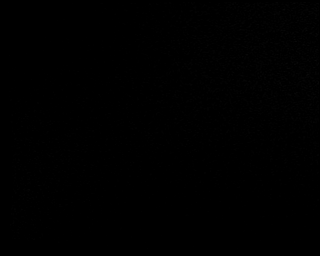

[40 of 40 positions shown; findings below may reference images not displayed]

FINDINGS: Bones/Joint/Cartilage

No marrow signal abnormality. No fracture or dislocation. Normal
alignment. No joint effusion.

Ligaments

Collateral ligaments are intact.  Lisfranc ligament is intact.

Muscles and Tendons
Flexor, peroneal and extensor compartment tendons are intact.
Muscles are normal.

Soft tissue
Small amount of fluid between the first and second metatarsal heads
as can be seen with intermetatarsal bursitis. No soft tissue mass. 5
mm ganglion cyst along the dorsal aspect of the first metatarsal
head adjacent to the MTP joint.
IMPRESSION: 1. No acute osseous injury of the left forefoot.
2. Mild intermetatarsal bursitis between the first and second
metatarsal heads.

## 2021-08-08 ENCOUNTER — Other Ambulatory Visit: Payer: Self-pay

## 2021-08-08 ENCOUNTER — Emergency Department
Admission: EM | Admit: 2021-08-08 | Discharge: 2021-08-08 | Disposition: A | Discharge status: Home / Self Care | Payer: Medicaid Other | Attending: Emergency Medicine | Admitting: Emergency Medicine

## 2021-08-08 DIAGNOSIS — R110 Nausea: Secondary | ICD-10-CM | POA: Diagnosis not present

## 2021-08-08 DIAGNOSIS — G43909 Migraine, unspecified, not intractable, without status migrainosus: Secondary | ICD-10-CM

## 2021-08-08 DIAGNOSIS — R51 Headache with orthostatic component, not elsewhere classified: Secondary | ICD-10-CM | POA: Diagnosis present

## 2021-08-08 LAB — CBC
HCT: 40 % (ref 36.0–46.0)
Hemoglobin: 13.5 g/dL (ref 12.0–15.0)
MCH: 31 pg (ref 26.0–34.0)
MCHC: 33.8 g/dL (ref 30.0–36.0)
MCV: 92 fL (ref 80.0–100.0)
Platelets: 263 10*3/uL (ref 150–400)
RBC: 4.35 MIL/uL (ref 3.87–5.11)
RDW: 11.5 % (ref 11.5–15.5)
WBC: 4.1 10*3/uL (ref 4.0–10.5)
nRBC: 0 % (ref 0.0–0.2)

## 2021-08-08 LAB — URINALYSIS, ROUTINE W REFLEX MICROSCOPIC
Bilirubin Urine: NEGATIVE
Glucose, UA: NEGATIVE mg/dL
Hgb urine dipstick: NEGATIVE
Ketones, ur: NEGATIVE mg/dL
Leukocytes,Ua: NEGATIVE
Nitrite: NEGATIVE
Protein, ur: NEGATIVE mg/dL
Specific Gravity, Urine: 1.011 (ref 1.005–1.030)
pH: 6 (ref 5.0–8.0)

## 2021-08-08 LAB — COMPREHENSIVE METABOLIC PANEL
ALT: 14 U/L (ref 0–44)
AST: 19 U/L (ref 15–41)
Albumin: 3.8 g/dL (ref 3.5–5.0)
Alkaline Phosphatase: 60 U/L (ref 38–126)
Anion gap: 8 (ref 5–15)
BUN: 6 mg/dL (ref 6–20)
CO2: 26 mmol/L (ref 22–32)
Calcium: 9.4 mg/dL (ref 8.9–10.3)
Chloride: 104 mmol/L (ref 98–111)
Creatinine, Ser: 0.78 mg/dL (ref 0.44–1.00)
GFR, Estimated: >60 mL/min (ref >60)
Glucose, Bld: 103 mg/dL — ABNORMAL HIGH (ref 70–99)
Potassium: 3.7 mmol/L (ref 3.5–5.1)
Sodium: 138 mmol/L (ref 135–145)
Total Bilirubin: 0.9 mg/dL (ref 0.3–1.2)
Total Protein: 7.7 g/dL (ref 6.5–8.1)

## 2021-08-08 LAB — LIPASE, BLOOD: Lipase: 24 U/L (ref 11–51)

## 2021-08-08 MED ORDER — KETOROLAC TROMETHAMINE 30 MG/ML IJ SOLN
30.0000 mg | Freq: Once | INTRAMUSCULAR | Status: AC
Start: 2021-08-08 — End: 2021-08-08
  Administered 2021-08-08: 30 mg via INTRAMUSCULAR
  Filled 2021-08-08: qty 1

## 2021-08-08 MED ORDER — METOCLOPRAMIDE HCL 10 MG PO TABS: 10.0000 mg | ORAL_TABLET | Freq: Three times a day (TID) | ORAL | 0 refills | Status: DC | PRN

## 2021-08-08 MED ORDER — METOCLOPRAMIDE HCL 10 MG PO TABS
10.0000 mg | ORAL_TABLET | Freq: Once | ORAL | Status: AC
  Administered 2021-08-08: 10 mg via ORAL
  Filled 2021-08-08: qty 1

## 2021-08-08 NOTE — ED Triage Notes (Signed)
Pt to ER via POV with complaints of HA x several months. Reports possible bug bite to middle of forehead, states a raised area appears intermittently and when this happens her headache starts again and worsens. Also reports x2 weeks of nausea, vomiting, and diarrhea.   Pt uses emgality shot for chronic migraines, reports no relief when using this and other prescribed medications.

## 2021-08-08 NOTE — Discharge Instructions (Addendum)
Take the prescription nausea medicine, OTC Benadryl, along with your migraine breakthrough medicine, as directed. Follow-up with your neurologist as planned. Return to the ED if needed.

## 2021-08-08 NOTE — ED Provider Notes (Signed)
[facility] Provider Note  Patient Contact: 5:52 PM (approximate)   History   Headache and Nausea   HPI  Danielle Pollard is a 36 y.o. female presents to the ED for evaluation of 2 months of intermittent persistent headache.  Patient would describe a frontal headache with associated nausea, for the last 2 months.  According to her recollection, the onset of the headache syndrome coincides with a insect bite she had to the central forehead at the hairline.  Patient is unaware of the insect that bit her, but she noted a small local reaction at that time.  She denied experiencing any abscess formation, anaphylaxis, angioedema, or cellulitis.  She did not seek medical care related to the insect bite.  She currently takes [employer], and a self-administered injection.  For the last 2 years and his medication regimen which has completely alleviated her of her previous migraine syndrome.  Patient presents to the ED today, for evaluation of this new headache syndrome, concerned that it may be related to the insect bite.  Patient denies any ongoing fevers, chills, sweats, chest pain, shortness of breath patient also denies any vision loss, vertigo dizziness, or weakness.  Patient scheduled to see her neurologist in March for routine evaluation.  Patient notes that alleviating measures have been attempted for the headaches, including Tylenol.     Physical Exam   Triage Vital Signs: ED Triage Vitals  Enc Vitals Group     BP 08/08/21 1640 (!) 136/98     Pulse Rate 08/08/21 1640 80     Resp 08/08/21 1640 16     Temp 08/08/21 1640 99 F (37.2 C)     Temp src --      SpO2 08/08/21 1640 98 %     Weight 08/08/21 1641 192 lb (87.1 kg)     Height 08/08/21 1641 5\' 5"  (1.651 m)     Head Circumference --      Peak Flow --      Pain Score 08/08/21 1641 8     Pain Loc --      Pain Edu? --      Excl. in GC? --     Most recent vital signs: Vitals:   08/08/21 1640  08/08/21 2007  BP: (!) 136/98 (!) 135/91  Pulse: 80 83  Resp: 16 18  Temp: 99 F (37.2 C)   SpO2: 98% 99%     General: Alert and in no acute distress. Eyes:  PERRL. EOMI. Head: No acute traumatic findings Neck: No stridor. No cervical spine tenderness to palpation. Cardiovascular:  Good peripheral perfusion Respiratory: Normal respiratory effort without tachypnea or retractions. Lungs CTAB.  Musculoskeletal: Full range of motion to all extremities.  Neurologic:  No gross focal neurologic deficits are appreciated.  Skin:   No rash noted  ED Results / Procedures / Treatments   Labs (all labs ordered are listed, but only abnormal results are displayed) Labs Reviewed  COMPREHENSIVE METABOLIC PANEL - Abnormal; Notable for the following components:      Result Value   Glucose, Bld 103 (*)    All other components within normal limits  URINALYSIS, ROUTINE W REFLEX MICROSCOPIC - Abnormal; Notable for the following components:   Color, Urine YELLOW (*)    APPearance HAZY (*)    All other components within normal limits  LIPASE, BLOOD  CBC     EKG   RADIOLOGY   No results found.  PROCEDURES:  Critical Care performed:  No  Procedures   MEDICATIONS ORDERED IN ED: Medications  ketorolac (TORADOL) 30 MG/ML injection 30 mg (30 mg Intramuscular Given 08/08/21 1910)  metoCLOPramide (REGLAN) tablet 10 mg (10 mg Oral Given 08/08/21 1910)     IMPRESSION / MDM / ASSESSMENT AND PLAN / ED COURSE  I reviewed the triage vital signs and the nursing notes.                              Differential diagnosis includes, but is not limited to, intracranial hemorrhage, meningitis/encephalitis, previous head trauma, cavernous venous thrombosis, tension headache, temporal arteritis, migraine or migraine equivalent, idiopathic intracranial hypertension, and non-specific headache.  Patient's diagnosis is consistent with chronic migraine or breakthrough migraine.  Patient responded to  medicines given in ED including Toradol and Reglan.  No red flags on exam and no concerning findings.  Patient's exam is reassuring at this time.  No acute neuromuscular deficit or red flags appreciated.  No indication of head trauma or symptoms consistent with an infectious process like meningitis.  Labs are normal as a CBC is without leukocytosis.  No electrolyte abnormalities appreciated, no signs of a UTI patient will be discharged home with prescriptions for Reglan. Patient is to follow up with her neurologist as needed or otherwise directed. Patient is given ED precautions to return to the ED for any worsening or new symptoms.    FINAL CLINICAL IMPRESSION(S) / ED DIAGNOSES   Final diagnoses:  Migraine without status migrainosus, not intractable, unspecified migraine type     Rx / DC Orders   ED Discharge Orders          Ordered    metoCLOPramide (REGLAN) 10 MG tablet  Every 8 hours PRN        08/08/21 1958             Note:  This document was prepared using Dragon voice recognition software and may include unintentional dictation errors.    Lissa Hoard, PA-C 08/08/21 2310    Chesley Noon, MD 08/08/21 [phone]

## 2021-08-20 ENCOUNTER — Other Ambulatory Visit: Payer: Self-pay | Admitting: Family Medicine

## 2021-08-20 DIAGNOSIS — N644 Mastodynia: Secondary | ICD-10-CM

## 2021-09-03 ENCOUNTER — Telehealth: Payer: Self-pay

## 2021-09-03 NOTE — Telephone Encounter (Signed)
New message ° ° ° ° °

## 2021-09-11 ENCOUNTER — Other Ambulatory Visit: Payer: Medicaid Other

## 2021-09-17 ENCOUNTER — Other Ambulatory Visit: Payer: Medicaid Other

## 2021-10-14 ENCOUNTER — Other Ambulatory Visit (HOSPITAL_COMMUNITY): Payer: Self-pay | Admitting: Family Medicine

## 2021-10-14 ENCOUNTER — Other Ambulatory Visit: Payer: Self-pay | Admitting: Family Medicine

## 2021-10-14 DIAGNOSIS — M25552 Pain in left hip: Secondary | ICD-10-CM

## 2021-10-16 NOTE — Progress Notes (Signed)
? ?Virtual Visit via Video Note ?The purpose of this virtual visit is to provide medical care while limiting exposure to the novel coronavirus.   ? ?Consent was obtained for video visit:  Yes.   ?Answered questions that patient had about telehealth interaction:  Yes.   ?I discussed the limitations, risks, security and privacy concerns of performing an evaluation and management service by telemedicine. I also discussed with the patient that there may be a patient responsible charge related to this service. The patient expressed understanding and agreed to proceed. ? ?Pt location: Home ?Physician Location: office ?Name of referring provider:  [facility] ?I connected with Danielle Pollard at patients initiation/request on 10/21/2021 at  9:10 AM EDT by video enabled telemedicine application and verified that I am speaking with the correct person using two identifiers. ?Pt MRN:  [id number] ?Pt DOB:  Jul 03, 1986 ?Video Participants:  Danielle Pollard ? ?Assessment and Plan:   ? For preventative management, Emgality ? For abortive therapy, rizatriptan 10mg  and Zofran 4mg  ? Limit use of pain relievers to no more than 2 days out of week to prevent risk of rebound or medication-overuse headache. ? Keep headache diary ? Exercise, hydration, caffeine cessation, sleep hygiene, monitor for and avoid triggers ? Follow up 1 year ? ?History of Present Illness:  ?Danielle Pollard is a 36 year old woman with migraines, asthma and sickle cell trait who follows up for migraines. ?  ?UPDATE: ?She had been doing well on Emgality over the past year.  She started getting new frequent headaches around November 2022.  In coincided with an insect bite on her forehead.  Feels like a bruise/knot.  She ultimately went to the ED on 08/08/2021 where she was treated with a migraine cocktail.    Still notes a knot/tenderness in middle of her forehead.   ? ?Virtually does not have a migraine unless late in taking the shot.  Lasts a  a day but mild in intensity.   ?Current NSAIDS:  ibuprofen ?Current analgesics:  acetaminophen ?Current triptans:  rizatriptan 10mg  ?Current ergotamine:  none ?Current anti-emetic:  none ?Current muscle relaxants:  tizanidine ?Current anti-anxiolytic:  none ?Current sleep aide:  none ?Current Antihypertensive medications:  none ?Current Antidepressant medications:  Cymbalta 30mg  daily ?Current Anticonvulsant medications:  none ?Current anti-CGRP:  Emgality ?Current Vitamins/Herbal/Supplements:  none ?Current Antihistamines/Decongestants:  none ?Other therapy:  none ?Hormone/birth control:  none ?  ?Caffeine:  No coffee.  No recent soda. ?Diet:  Hydrates.  Does not skip meals. ?Exercise:  routine ?Depression:  no; Anxiety:  no ?Other pain:  no ?Sleep hygiene:  Good. ?  ?HISTORY:  ?Onset:  Middle school ?Location:  Unilateral (either side) or bifrontal ?Quality:  Pressure, squeezing, throbbing, pounding ?Initial Intensity:  10/10.  She denies new headache, thunderclap headache ?Aura:  no ?Premonitory Phase:  no ?Postdrome:  Fatigue or linger dull headache for 1 to 2 days ?Associated symptoms:  Photophobia, phonophobia, nausea, vomiting, blurred vision, dizzy when severe.  She denies associated unilateral numbness or weakness. ?Initial Duration:  2-3 days up to a week ?Initial Frequency:  Frequent until hysterectomy last year and since then once a month (from 2-3 days up to a week) ?Frequency of abortive medication: usually 3 days a month ?Triggers:  Unknown ?Relieving factors:  Sleep in dark and quiet room, laying on a cold floor ?Activity:  aggravates ?  ?Headaches have been worked up with 2 head CTs.  CT Head from 12/17/11 reportedly was normal.  CT  head from 06/07/15 was personally reviewed and was also unremarkable.  ?  ?  ?Past NSAIDS:  naproxen, Mobic ?Past analgesics:  Excedrin ?Past abortive triptans:  Sumatriptan 100mg  ?Past abortive ergotamine:  none ?Past muscle relaxants:  none ?Past anti-emetic:  Reglan,  Zofran, Phenergan ?Past antihypertensive medications:  none ?Past antidepressant medications:  nortriptyline ?Past anticonvulsant medications:  Topiramate, gabapentin (for nerve pain) ?Past anti-CGRP:  none ?Past vitamins/Herbal/Supplements:  none ?Past antihistamines/decongestants:  none ?Other past therapies: Depo-Provera ?  ?  ?Family history of headache:  Maternal aunt ? ?Past Medical History: ?Past Medical History:  ?Diagnosis Date  ? Anemia   ? Asthma   ? WELL CONTROLLED-LAST USED INHALER IN 2012  ? Migraines   ? MIGRAINES  ? Obesity, unspecified 04/18/2014  ? Sickle cell trait (HCC)   ? ? ?Medications: ?Outpatient Encounter Medications as of 10/21/2021  ?Medication Sig  ? albuterol (PROVENTIL HFA;VENTOLIN HFA) 108 (90 Base) MCG/ACT inhaler Inhale 2 puffs into the lungs every 6 (six) hours as needed for wheezing or shortness of breath.  ? diclofenac (VOLTAREN) 75 MG EC tablet diclofenac sodium 75 mg tablet,delayed release ? Take 1 tablet twice a day by oral route for 30 days.  ? diclofenac Sodium (VOLTAREN) 1 % GEL diclofenac 1 % topical gel ? APPLY 2 GRAMS TO THE AFFECTED AREA(S) BY TOPICAL ROUTE 4 TIMES PER DAY  ? DULoxetine (CYMBALTA) 30 MG capsule Take 30 mg by mouth daily.  ? EMGALITY 120 MG/ML SOAJ INJECT 120 MG INTO THE SKIN EVERY 30 (THIRTY) DAYS.  ? gabapentin (NEURONTIN) 400 MG capsule Take 400 mg by mouth 4 (four) times daily.  ? ibuprofen (ADVIL) 800 MG tablet ibuprofen 800 mg tablet ? TAKE 1 TABLET BY MOUTH WITH FOOD EVERY 8 HOURS AS NEEDED FOR PAIN  ? meloxicam (MOBIC) 15 MG tablet Take 15 mg by mouth daily.  ? metoCLOPramide (REGLAN) 10 MG tablet Take 1 tablet (10 mg total) by mouth every 8 (eight) hours as needed for up to 10 days for nausea.  ? pantoprazole (PROTONIX) 40 MG tablet Take 1 tablet (40 mg total) by mouth daily before breakfast.  ? promethazine (PHENERGAN) 12.5 MG tablet Take 1 tablet (12.5 mg total) by mouth every 6 (six) hours as needed for nausea or vomiting.  ? rizatriptan  (MAXALT) 10 MG tablet TAKE 1 TABLET EARLIEST ONSET OF MIGRAINE. MAY REPEAT IN 2 HOURS IF NEEDED. MAX 2 TABLETS IN 24 HRS  ? tiZANidine (ZANAFLEX) 4 MG tablet tizanidine 4 mg tablet ? TAKE 1 TABLET BY MOUTH TWICE A DAY AS NEEDED FOR MUSCLE SPASMS  ? ?No facility-administered encounter medications on file as of 10/21/2021.  ? ? ?Allergies: ?No Known Allergies ? ?Family History: ?Family History  ?Problem Relation Age of Onset  ? Hypertension Maternal Grandmother   ? Hypertension Maternal Grandfather   ? Breast cancer Neg Hx   ? ? ?Observations/Objective:   ?No acute distress.  Alert and oriented.  Speech fluent and not dysarthric.  Language intact.  ? ? ?Follow Up Instructions: ?  ? -I discussed the assessment and treatment plan with the patient. The patient was provided an opportunity to ask questions and all were answered. The patient agreed with the plan and demonstrated an understanding of the instructions. ?  ?The patient was advised to call back or seek an in-person evaluation if the symptoms worsen or if the condition fails to improve as anticipated. ? ? ? ?10/23/2021, DO ? ?

## 2021-10-17 ENCOUNTER — Telehealth: Payer: Medicaid Other | Admitting: Neurology

## 2021-10-21 ENCOUNTER — Other Ambulatory Visit: Payer: Self-pay

## 2021-10-21 ENCOUNTER — Telehealth (INDEPENDENT_AMBULATORY_CARE_PROVIDER_SITE_OTHER): Payer: Medicaid Other | Admitting: Neurology

## 2021-10-21 ENCOUNTER — Encounter: Payer: Self-pay | Admitting: Neurology

## 2021-10-21 VITALS — Ht 64.0 in | Wt 200.0 lb

## 2021-10-21 DIAGNOSIS — G43009 Migraine without aura, not intractable, without status migrainosus: Secondary | ICD-10-CM

## 2021-10-21 MED ORDER — RIZATRIPTAN BENZOATE 10 MG PO TABS: ORAL_TABLET | ORAL | 5 refills | Status: DC

## 2021-10-21 MED ORDER — ONDANSETRON 4 MG PO TBDP: 4.0000 mg | ORAL_TABLET | Freq: Three times a day (TID) | ORAL | 5 refills | Status: DC | PRN

## 2021-10-21 MED ORDER — EMGALITY 120 MG/ML ~~LOC~~ SOAJ: 120.0000 mg | SUBCUTANEOUS | 5 refills | Status: DC

## 2021-10-22 ENCOUNTER — Ambulatory Visit
Admission: RE | Admit: 2021-10-22 | Discharge: 2021-10-22 | Disposition: A | Discharge status: Home / Self Care | Payer: Medicaid Other | Source: Ambulatory Visit | Attending: Family Medicine | Admitting: Family Medicine

## 2021-10-22 ENCOUNTER — Other Ambulatory Visit: Payer: Self-pay | Admitting: Family Medicine

## 2021-10-22 DIAGNOSIS — M25552 Pain in left hip: Secondary | ICD-10-CM | POA: Insufficient documentation

## 2021-10-23 ENCOUNTER — Other Ambulatory Visit (HOSPITAL_COMMUNITY): Payer: Self-pay

## 2021-10-23 ENCOUNTER — Telehealth: Payer: Self-pay

## 2021-10-23 NOTE — Telephone Encounter (Signed)
Patient Advocate Encounter ?  ?Received notification from [facility] that prior authorization for Emgality 120mg /ml  is required by his/her insurance [employer]. ?  ?PA submitted on 10/23/21 ? ?Key#: [id number] ? ?Status is pending ?   ?[facility] will continue to follow: ? ?Patient Advocate ?Fax: [phone]  ?

## 2021-10-23 NOTE — Telephone Encounter (Signed)
Patient Advocate Encounter ? ?Prior Authorization for [employer] 120mg /ml auto-injectors has been approved.   ? ?PA# ? ?Effective dates: 10/21/21 through 10/23/22 ? ?Per Test Claim Patients co-pay is $0.  ? ?Spoke with Pharmacy to Process. ? ?Patient Advocate ?Fax: [phone]  ?

## 2022-04-09 ENCOUNTER — Other Ambulatory Visit: Payer: Self-pay | Admitting: Family Medicine

## 2022-04-09 DIAGNOSIS — R102 Pelvic and perineal pain: Secondary | ICD-10-CM

## 2022-04-16 ENCOUNTER — Other Ambulatory Visit: Payer: Self-pay | Admitting: Family Medicine

## 2022-04-16 ENCOUNTER — Ambulatory Visit
Admission: RE | Admit: 2022-04-16 | Discharge: 2022-04-16 | Disposition: A | Discharge status: Home / Self Care | Payer: Medicaid Other | Source: Ambulatory Visit | Attending: Family Medicine | Admitting: Family Medicine

## 2022-04-16 DIAGNOSIS — R102 Pelvic and perineal pain: Secondary | ICD-10-CM

## 2022-05-22 ENCOUNTER — Other Ambulatory Visit: Payer: Medicaid Other

## 2022-05-22 ENCOUNTER — Inpatient Hospital Stay: Admission: RE | Admit: 2022-05-22 | Payer: Medicaid Other | Source: Ambulatory Visit

## 2022-05-30 ENCOUNTER — Other Ambulatory Visit: Payer: Medicaid Other

## 2022-06-11 ENCOUNTER — Other Ambulatory Visit: Payer: Self-pay | Admitting: Family Medicine

## 2022-06-11 ENCOUNTER — Ambulatory Visit
Admission: RE | Admit: 2022-06-11 | Discharge: 2022-06-11 | Disposition: A | Discharge status: Home / Self Care | Payer: Medicaid Other | Source: Ambulatory Visit | Attending: Family Medicine | Admitting: Family Medicine

## 2022-06-11 DIAGNOSIS — N644 Mastodynia: Secondary | ICD-10-CM

## 2022-06-17 ENCOUNTER — Other Ambulatory Visit (HOSPITAL_COMMUNITY)
Admission: RE | Admit: 2022-06-17 | Discharge: 2022-06-17 | Disposition: A | Discharge status: Home / Self Care | Payer: Medicaid Other | Source: Ambulatory Visit | Attending: Obstetrics & Gynecology | Admitting: Obstetrics & Gynecology

## 2022-06-17 ENCOUNTER — Ambulatory Visit (INDEPENDENT_AMBULATORY_CARE_PROVIDER_SITE_OTHER): Payer: Medicaid Other | Admitting: Obstetrics & Gynecology

## 2022-06-17 ENCOUNTER — Encounter: Payer: Self-pay | Admitting: Obstetrics & Gynecology

## 2022-06-17 VITALS — BP 116/77 | HR 59 | Ht 63.0 in | Wt 200.0 lb

## 2022-06-17 DIAGNOSIS — G8929 Other chronic pain: Secondary | ICD-10-CM | POA: Insufficient documentation

## 2022-06-17 DIAGNOSIS — N760 Acute vaginitis: Secondary | ICD-10-CM

## 2022-06-17 DIAGNOSIS — Z90710 Acquired absence of both cervix and uterus: Secondary | ICD-10-CM

## 2022-06-17 DIAGNOSIS — R102 Pelvic and perineal pain: Secondary | ICD-10-CM | POA: Insufficient documentation

## 2022-06-17 NOTE — Progress Notes (Signed)
GYNECOLOGY OFFICE VISIT NOTE  History:   Danielle Pollard is a 36 y.o. G1P1001 here today to establish care.  History of TLH/BS in 2019 for chronic pelvic pain.  Still has some residual pain in lower abdomen and pelvis occasionally, had negative pelvic ultrasound in 03/2022.  Reports recurrent vaginitis episodes, wants to be evaluated today. Also wants STI screen.  She denies any abnormal vaginal discharge, bleeding or other concerns.    Past Medical History:  Diagnosis Date   Anemia    Asthma    WELL CONTROLLED-LAST USED INHALER IN 2012   Migraines    MIGRAINES   Obesity, unspecified 04/18/2014   Sickle cell trait [facility])     Past Surgical History:  Procedure Laterality Date   ABDOMINAL HYSTERECTOMY     BREAST BIOPSY Right 06/27/2021   stereo bx , asymmetry"X" clip-path pending   CESAREAN SECTION     DIAGNOSTIC LAPAROSCOPY  2012   Dr. Luella Cook; lysis of adhesions   ESOPHAGOGASTRODUODENOSCOPY (EGD) WITH PROPOFOL N/A 03/05/2020   Procedure: ESOPHAGOGASTRODUODENOSCOPY (EGD) WITH PROPOFOL;  Surgeon: Toney Reil, MD;  Location: [facility] ENDOSCOPY;  Service: Gastroenterology;  Laterality: N/A;   FOOT TENDON SURGERY     LAPAROSCOPIC HYSTERECTOMY Bilateral 01/05/2018   Procedure: HYSTERECTOMY TOTAL LAPAROSCOPIC, BILATERAL SALPINGECTOMY;  Surgeon: Nadara Mustard, MD;  Location: [facility] ORS;  Service: Gynecology;  Laterality: Bilateral;   LAPAROSCOPY N/A 01/05/2018   Procedure: LAPAROSCOPY DIAGNOSTIC;  Surgeon: Nadara Mustard, MD;  Location: [facility] ORS;  Service: Gynecology;  Laterality: N/A;    The following portions of the patient's history were reviewed and updated as appropriate: allergies, current medications, past family history, past medical history, past social history, past surgical history and problem list.   Review of Systems:  Pertinent items noted in HPI and remainder of comprehensive ROS otherwise negative.  Physical Exam:  BP 116/77   Pulse (!) 59   Ht 5\' 3"  (1.6  m)   Wt 200 lb (90.7 kg)   LMP 04/18/2016   BMI 35.43 kg/m  CONSTITUTIONAL: Well-developed, well-nourished female in no acute distress.  HEENT:  Normocephalic, atraumatic. External right and left ear normal. No scleral icterus.  NECK: Normal range of motion, supple, no masses noted on observation SKIN: No rash noted. Not diaphoretic. No erythema. No pallor. MUSCULOSKELETAL: Normal range of motion. No edema noted. NEUROLOGIC: Alert and oriented to person, place, and time. Normal muscle tone coordination. No cranial nerve deficit noted. PSYCHIATRIC: Normal mood and affect. Normal behavior. Normal judgment and thought content. CARDIOVASCULAR: Normal heart rate noted RESPIRATORY: Effort and breath sounds normal, no problems with respiration noted ABDOMEN: No masses noted. No other overt distention noted.   Mild diffuse tenderness in mid abdominal area, no pelvic tenderness. PELVIC: Normal appearing external genitalia; normal urethral meatus; normal appearing distal vaginal mucosa. Thin, white discharge noted, testing sample obtained.   Performed in the presence of a chaperone     Assessment and Plan:     1. History of total hysterectomy Benign pathology, no need for pap smears. Patient is aware.  2. Recurrent vaginitis - Cervicovaginal ancillary only done.  Proper vulvar hygiene emphasized: discussed avoidance of perfumed soaps, detergents, lotions and any type of douches; in addition to wearing cotton underwear and no underwear at night.  Also recommended cleaning front to back, voiding and cleaning up after intercourse.  If BV present, patient desires prolonged vaginal metronidazole therapy in lieu of boric acid therapy.   3. Chronic female pelvic pain Multifactorial, likely myofascial.  No localizing symptoms.  No intervention needed for now. Continue to monitor.  Routine preventative health maintenance measures emphasized. Please refer to After Visit Summary for other counseling  recommendations.   Return for any gynecologic concerns.    I spent 30 minutes dedicated to the care of this patient including pre-visit review of records, face to face time with the patient discussing her conditions and treatments and post visit orders.    Jaynie Collins, MD, FACOG Obstetrician & Gynecologist, [facility] for [employer], [facility]

## 2022-06-18 LAB — CERVICOVAGINAL ANCILLARY ONLY
Bacterial Vaginitis (gardnerella): NEGATIVE
Candida Glabrata: NEGATIVE
Candida Vaginitis: NEGATIVE
Chlamydia: NEGATIVE
Comment: NEGATIVE
Comment: NEGATIVE
Comment: NEGATIVE
Comment: NEGATIVE
Comment: NEGATIVE
Comment: NORMAL
Neisseria Gonorrhea: NEGATIVE
Trichomonas: NEGATIVE

## 2022-08-07 NOTE — Progress Notes (Signed)
FMLA Paperwork Rec from Patient

## 2022-08-22 NOTE — Progress Notes (Signed)
Telephone call to patient, Per Dr. We need to discuss question on the FMLA Form How frequent are the migraine. Meaning how may episodes do you have 1 once a week lasting 1-2 days or 2 per week lasting 1-2 days.

## 2022-08-22 NOTE — Progress Notes (Signed)
Patient returned our call, Per patient since sh missed three months of the emaglity( On Back Order) her migraines have increase in [place].  Last week she had one last two days with nausea and vomiting, Unable to eat. Patient states she has one now since this morning.   2-3 a week lasting 1-2 days

## 2022-08-22 NOTE — Progress Notes (Signed)
Patient advised of Dr.Jaffe note, She will have to wait for the Emgality to kick back in.  When she gets a chance, she may come by the office to try samples of Nurtec to see if it works better than rizatriptan (1 daily as needed)

## 2022-08-25 ENCOUNTER — Telehealth: Payer: Self-pay

## 2022-08-25 NOTE — Telephone Encounter (Signed)
LMOVM for patient to call the office back.

## 2022-08-26 NOTE — Telephone Encounter (Signed)
Pt called in and left a message. She was returning a call to [facility]

## 2022-08-26 NOTE — Telephone Encounter (Signed)
Patient advised need to go over FMLA with [facility] during a visit.  VV with [facility] 08/28/22

## 2022-08-27 NOTE — Progress Notes (Unsigned)
Virtual Visit via Video Note  Consent was obtained for video visit:  Yes.   Answered questions that patient had about telehealth interaction:  Yes.   I discussed the limitations, risks, security and privacy concerns of performing an evaluation and management service by telemedicine. I also discussed with the patient that there may be a patient responsible charge related to this service. The patient expressed understanding and agreed to proceed.  Pt location: Home Physician Location: office Name of referring provider:  [facility] connected with Danielle Pollard at patients initiation/request on 08/28/2022 at  8:30 AM EST by video enabled telemedicine application and verified that I am speaking with the correct person using two identifiers. Pt MRN:  [id number] Pt DOB:  30-Aug-1985 Video Participants:  Danielle Pollard;   Assessment and Plan:   Migraine without aura, without status migrainosus, ntractable - just restarted Emgality last month.  Already with some improvement   Migraine prevention: Emgality   Migraine rescue: rizatriptan 10mg  and Zofran 4mg .  She will come by the office to try samples of Nurtec as well.  Limit use of pain relievers to no more than 2 days out of week to prevent risk of rebound or medication-overuse headache.  Keep headache diary  Exercise, hydration, caffeine cessation, sleep hygiene, monitor for and avoid triggers  Follow up 6 months.  History of Present Illness:  Danielle Pollard is a 37 year old woman with migraines, asthma and sickle cell trait who follows up for migraines.   UPDATE: Migraines are more frequent.  It was backordered and she was unable to take it after September-October.  She had a migraine every week. She finally was able to take Emgality on 1/1.  This past month.  Intensity:  severe Duration:  one lasted a day, another lasted 2-3 days.  Did not respond to rizatriptan.   Frequency:  2 in January   Current  NSAIDS:  ibuprofen Current analgesics:  acetaminophen Current triptans:  rizatriptan 10mg  Current ergotamine:  none Current anti-emetic:  none Current muscle relaxants:  tizanidine Current anti-anxiolytic:  none Current sleep aide:  none Current Antihypertensive medications:  none Current Antidepressant medications:  Cymbalta 30mg  daily Current Anticonvulsant medications:  none Current anti-CGRP:  Emgality Current Vitamins/Herbal/Supplements:  none Current Antihistamines/Decongestants:  none Other therapy:  none Hormone/birth control:  none   Caffeine:  No coffee.  No recent soda. Diet:  Hydrates.  Does not skip meals. Exercise:  routine Depression:  no; Anxiety:  no Other pain:  no Sleep hygiene:  Good.   HISTORY:  Onset:  Middle school Location:  Unilateral (either side) or bifrontal Quality:  Pressure, squeezing, throbbing, pounding Initial Intensity:  10/10.  She denies new headache, thunderclap headache Aura:  no Premonitory Phase:  no Postdrome:  Fatigue or linger dull headache for 1 to 2 days Associated symptoms:  Photophobia, phonophobia, nausea, vomiting, blurred vision, dizzy when severe.  She denies associated unilateral numbness or weakness. Initial Duration:  2-3 days up to a week Initial Frequency:  Frequent until hysterectomy last year and since then once a month (from 2-3 days up to a week) Frequency of abortive medication: usually 3 days a month Triggers:  Unknown Relieving factors:  Sleep in dark and quiet room, laying on a cold floor Activity:  aggravates   Headaches have been worked up with 2 head CTs.  CT Head from 12/17/11 reportedly was normal.  CT head from 06/07/15 was personally reviewed and was also unremarkable.  Past NSAIDS:  naproxen, Mobic Past analgesics:  Excedrin Past abortive triptans:  Sumatriptan 100mg  Past abortive ergotamine:  none Past muscle relaxants:  none Past anti-emetic:  Reglan, Zofran, Phenergan Past antihypertensive  medications:  none Past antidepressant medications:  nortriptyline Past anticonvulsant medications:  Topiramate, gabapentin (for nerve pain) Past anti-CGRP:  none Past vitamins/Herbal/Supplements:  none Past antihistamines/decongestants:  none Other past therapies: Depo-Provera     Family history of headache:  Maternal aunt  Past Medical History: Past Medical History:  Diagnosis Date   Anemia    Asthma    WELL CONTROLLED-LAST USED INHALER IN 2012   Migraines    MIGRAINES   Obesity, unspecified 04/18/2014   Sickle cell trait ([place])     Medications: Outpatient Encounter Medications as of 08/28/2022  Medication Sig   albuterol (PROVENTIL HFA;VENTOLIN HFA) 108 (90 Base) MCG/ACT inhaler Inhale 2 puffs into the lungs every 6 (six) hours as needed for wheezing or shortness of breath.   diclofenac Sodium (VOLTAREN) 1 % GEL diclofenac 1 % topical gel  APPLY 2 GRAMS TO THE AFFECTED AREA(S) BY TOPICAL ROUTE 4 TIMES PER DAY   DULoxetine (CYMBALTA) 30 MG capsule Take 30 mg by mouth daily.   Galcanezumab-gnlm (EMGALITY) 120 MG/ML SOAJ Inject 120 mg into the skin every 30 (thirty) days.   ibuprofen (ADVIL) 800 MG tablet ibuprofen 800 mg tablet  TAKE 1 TABLET BY MOUTH WITH FOOD EVERY 8 HOURS AS NEEDED FOR PAIN   rizatriptan (MAXALT) 10 MG tablet May repeat in 2 hours if needed   tiZANidine (ZANAFLEX) 4 MG tablet tizanidine 4 mg tablet  TAKE 1 TABLET BY MOUTH TWICE A DAY AS NEEDED FOR MUSCLE SPASMS   No facility-administered encounter medications on file as of 08/28/2022.    Allergies: No Known Allergies  Family History: Family History  Problem Relation Age of Onset   Hypertension Maternal Grandmother    Hypertension Maternal Grandfather    Breast cancer Neg Hx     Observations/Objective:   No acute distress.  Alert and oriented.  Speech fluent and not dysarthric.  Language intact.     Follow Up Instructions:    -I discussed the assessment and treatment plan with the patient. The  patient was provided an opportunity to ask questions and all were answered. The patient agreed with the plan and demonstrated an understanding of the instructions.   The patient was advised to call back or seek an in-person evaluation if the symptoms worsen or if the condition fails to improve as anticipated.   Dudley Major, DO

## 2022-08-28 ENCOUNTER — Telehealth (INDEPENDENT_AMBULATORY_CARE_PROVIDER_SITE_OTHER): Payer: Medicaid Other | Admitting: Neurology

## 2022-08-28 ENCOUNTER — Encounter: Payer: Self-pay | Admitting: Neurology

## 2022-08-28 VITALS — Ht 63.0 in | Wt 202.0 lb

## 2022-08-28 DIAGNOSIS — G43019 Migraine without aura, intractable, without status migrainosus: Secondary | ICD-10-CM | POA: Diagnosis not present

## 2022-09-11 NOTE — Telephone Encounter (Signed)
Patient was told on visit on 2/1 that the FMLA paperwork would be faxed over, but her manager has not heard or seen anything. Wondering if we have an update.

## 2022-09-12 NOTE — Telephone Encounter (Signed)
FMLA Forms faxed, patient advised.

## 2022-10-17 DIAGNOSIS — Z0279 Encounter for issue of other medical certificate: Secondary | ICD-10-CM

## 2022-10-20 ENCOUNTER — Telehealth: Payer: Self-pay

## 2022-10-20 NOTE — Telephone Encounter (Signed)
Called patient and let her know 7-10 business days for [facility]

## 2022-10-20 NOTE — Telephone Encounter (Signed)
Patient paid on Friday for FMLA papers wondering what the turn around time is for them to be faxed to her employer.

## 2022-10-22 ENCOUNTER — Ambulatory Visit: Payer: Medicaid Other | Admitting: Neurology

## 2022-10-31 ENCOUNTER — Telehealth: Payer: Self-pay | Admitting: Neurology

## 2022-10-31 NOTE — Telephone Encounter (Signed)
She was calling to see if the FMLA paper work is ready

## 2022-11-03 NOTE — Telephone Encounter (Signed)
Forms faxed 08/2022. Mychart message sent to patient with FMLA paperwork attached.

## 2022-11-19 ENCOUNTER — Other Ambulatory Visit: Payer: Self-pay | Admitting: Neurology

## 2022-12-01 ENCOUNTER — Other Ambulatory Visit: Payer: Self-pay | Admitting: Neurology

## 2022-12-03 ENCOUNTER — Telehealth: Payer: Self-pay | Admitting: Neurology

## 2022-12-03 NOTE — Telephone Encounter (Signed)
PA team please start a PA for Emgality 120 mg.

## 2022-12-03 NOTE — Telephone Encounter (Signed)
Patient left message on VM stating that she needs a prior auth for the emgality  please call

## 2022-12-04 ENCOUNTER — Other Ambulatory Visit (HOSPITAL_COMMUNITY): Payer: Self-pay

## 2022-12-04 ENCOUNTER — Telehealth: Payer: Self-pay

## 2022-12-04 NOTE — Telephone Encounter (Signed)
PA submitted, will be updated in additional encounter created.  

## 2022-12-04 NOTE — Telephone Encounter (Signed)
PA request received via provider/CMM for Emgality 120MG /ML auto-injectors (migraine)  PA has been submitted to [facility] and is pending determination  Key: [id number]

## 2022-12-09 ENCOUNTER — Other Ambulatory Visit (HOSPITAL_COMMUNITY): Payer: Self-pay

## 2022-12-09 NOTE — Telephone Encounter (Signed)
Patient Advocate Encounter  Prior Authorization for [facility] 120MG  has been approved with [facility] ([facility]    PA#  Effective dates: 5.9.24 through 5.9.25  Per [facility] test claim, copay for 30 days supply is $4

## 2022-12-12 NOTE — Telephone Encounter (Signed)
This has been sent to prior authorization. Unable to know the time, will call the patient

## 2022-12-12 NOTE — Telephone Encounter (Signed)
Pt called in and left a message. She still has not been able to get her emgality shot. She is wondering how much longer it might be?

## 2022-12-26 ENCOUNTER — Other Ambulatory Visit: Payer: Self-pay

## 2022-12-26 ENCOUNTER — Telehealth: Payer: Self-pay | Admitting: Neurology

## 2022-12-26 MED ORDER — EMGALITY 120 MG/ML ~~LOC~~ SOAJ: 120.0000 mg | SUBCUTANEOUS | 3 refills | Status: DC

## 2022-12-26 NOTE — Telephone Encounter (Signed)
Prescription received.

## 2022-12-26 NOTE — Telephone Encounter (Signed)
New message      1. Which medications need to be refilled? (please list name of each medication and dose if known)  Galcanezumab-gnlm (EMGALITY) 120 MG/ML SOAJ   2. Which pharmacy/location (including street and city if local pharmacy) is medication to be sent to? [id number] [place] [place] [facility] [address]   3. Do they need a 30 day or 90 day supply? 30 day supply

## 2023-01-07 ENCOUNTER — Ambulatory Visit (LOCAL_COMMUNITY_HEALTH_CENTER): Payer: Self-pay

## 2023-01-07 ENCOUNTER — Ambulatory Visit: Payer: Self-pay

## 2023-01-07 DIAGNOSIS — Z719 Counseling, unspecified: Secondary | ICD-10-CM

## 2023-01-07 NOTE — Progress Notes (Addendum)
Pt seen in clinic requested TB ppd skin test and Varicella vaccine required for school. Advised pt to come back either Friday or Monday for Varicella vaccine and PPD skin test to be able to read results in 3 days, pt agreed and re-scheduled appt this Monday. M.Sehaj Mcenroe, LPN.

## 2023-01-09 ENCOUNTER — Ambulatory Visit (LOCAL_COMMUNITY_HEALTH_CENTER): Payer: Medicaid Other

## 2023-01-09 ENCOUNTER — Other Ambulatory Visit: Payer: Medicaid Other

## 2023-01-09 DIAGNOSIS — Z111 Encounter for screening for respiratory tuberculosis: Secondary | ICD-10-CM

## 2023-01-09 DIAGNOSIS — Z23 Encounter for immunization: Secondary | ICD-10-CM

## 2023-01-09 DIAGNOSIS — Z719 Counseling, unspecified: Secondary | ICD-10-CM

## 2023-01-09 NOTE — Progress Notes (Signed)
Pt seen in clinic for Varicella vaccine and PPD TB skin test requires for school. No vaccine record provided, pt denied having chicken pox as a child. Given ppd skin test to rfa and varicella vaccine SQ to upper left posterior arm, tolerated well. Given VIS and NCIR copy, explained and understood. M.Ritika Hellickson, LPN.

## 2023-01-12 ENCOUNTER — Other Ambulatory Visit: Payer: Self-pay

## 2023-01-12 ENCOUNTER — Ambulatory Visit (LOCAL_COMMUNITY_HEALTH_CENTER): Payer: Self-pay

## 2023-01-12 DIAGNOSIS — Z111 Encounter for screening for respiratory tuberculosis: Secondary | ICD-10-CM

## 2023-01-12 LAB — TB SKIN TEST
Induration: 0 mm
TB Skin Test: NEGATIVE

## 2023-02-26 ENCOUNTER — Ambulatory Visit: Payer: Medicaid Other | Admitting: Neurology

## 2023-02-26 NOTE — Progress Notes (Signed)
Virtual Visit via Video Note  Consent was obtained for video visit:  Yes.   Answered questions that patient had about telehealth interaction:  Yes.   I discussed the limitations, risks, security and privacy concerns of performing an evaluation and management service by telemedicine. I also discussed with the patient that there may be a patient responsible charge related to this service. The patient expressed understanding and agreed to proceed.  Pt location: Home Physician Location: office Name of referring provider:  [facility] I connected with Danielle Pollard at patients initiation/request on 02/27/2023 at  1:50 PM EDT by video enabled telemedicine application and verified that I am speaking with the correct person using two identifiers. Pt MRN:  [id number] Pt DOB:  1986/01/22 Video Participants:  Danielle Pollard   Assessment/Plan:   Migraine without aura, without status migrainosus, not intractable    Migraine prevention: Emgality every 4 weeks  Migraine rescue: rizatriptan 10mg  and Zofran 4mg . Nurtec is more effective but I would like to find her an acute treatment that can abort the migraine faster.  She will come by the office next week to pick up samples of Ubrelvy.  If Nurtec works better, we will send a prescription for Nurtec.  Limit use of pain relievers to no more than 2 days out of week to prevent risk of rebound or medication-overuse headache.  Keep headache diary  Exercise, hydration, caffeine cessation, sleep hygiene, monitor for and avoid triggers  Follow up 6 months.  Subjective:  Danielle Pollard is a 37 year old woman with migraines, asthma and sickle cell trait who follows up for migraines.   UPDATE: Reports increased frequency of migraines a month ago.   Nurtec works, lasting no more than a day.   Intensity:  severe Duration:  no more than 1 day with Nurtec Frequency:  1-2 a month.  Last month, she had 2.     Current NSAIDS:   ibuprofen Current analgesics:  acetaminophen Current triptans:  rizatriptan 10mg  Current ergotamine:  none Current anti-emetic:  none Current muscle relaxants:  tizanidine Current anti-anxiolytic:  none Current sleep aide:  none Current Antihypertensive medications:  none Current Antidepressant medications:  Cymbalta 30mg  daily Current Anticonvulsant medications:  none Current anti-CGRP:  Emgality Current Vitamins/Herbal/Supplements:  none Current Antihistamines/Decongestants:  none Other therapy:  none Hormone/birth control:  none   Caffeine:  No coffee.  No recent soda. Diet:  Hydrates.  Does not skip meals. Exercise:  routine Depression:  no; Anxiety:  no Other pain:  no Sleep hygiene:  Good.   HISTORY:  Onset:  Middle school Location:  Unilateral (either side) or bifrontal Quality:  Pressure, squeezing, throbbing, pounding Initial Intensity:  10/10.  She denies new headache, thunderclap headache Aura:  no Premonitory Phase:  no Postdrome:  Fatigue or linger dull headache for 1 to 2 days Associated symptoms:  Photophobia, phonophobia, nausea, vomiting, blurred vision, dizzy when severe.  She denies associated unilateral numbness or weakness. Initial Duration:  2-3 days up to a week Initial Frequency:  Frequent until hysterectomy last year and since then once a month (from 2-3 days up to a week) Frequency of abortive medication: usually 3 days a month Triggers:  Unknown Relieving factors:  Sleep in dark and quiet room, laying on a cold floor Activity:  aggravates   Headaches have been worked up with 2 head CTs.  CT Head from 12/17/11 reportedly was normal.  CT head from 06/07/15 was personally reviewed and was also unremarkable.  Past NSAIDS:  naproxen, Mobic Past analgesics:  Excedrin Past abortive triptans:  Sumatriptan 100mg  Past abortive ergotamine:  none Past muscle relaxants:  none Past anti-emetic:  Reglan, Zofran, Phenergan Past antihypertensive  medications:  none Past antidepressant medications:  nortriptyline Past anticonvulsant medications:  Topiramate, gabapentin (for nerve pain) Past anti-CGRP:  none Past vitamins/Herbal/Supplements:  none Past antihistamines/decongestants:  none Other past therapies: Depo-Provera     Family history of headache:  Maternal aunt  Past Medical History: Past Medical History:  Diagnosis Date   Anemia    Asthma    WELL CONTROLLED-LAST USED INHALER IN 2012   Migraines    MIGRAINES   Obesity, unspecified 04/18/2014   Sickle cell trait ([facility])     Medications: Outpatient Encounter Medications as of 02/27/2023  Medication Sig   albuterol (PROVENTIL HFA;VENTOLIN HFA) 108 (90 Base) MCG/ACT inhaler Inhale 2 puffs into the lungs every 6 (six) hours as needed for wheezing or shortness of breath.   diclofenac Sodium (VOLTAREN) 1 % GEL diclofenac 1 % topical gel  APPLY 2 GRAMS TO THE AFFECTED AREA(S) BY TOPICAL ROUTE 4 TIMES PER DAY   DULoxetine (CYMBALTA) 30 MG capsule Take 30 mg by mouth daily.   Galcanezumab-gnlm (EMGALITY) 120 MG/ML SOAJ Inject 120 mg into the skin every 30 (thirty) days.   ibuprofen (ADVIL) 800 MG tablet ibuprofen 800 mg tablet  TAKE 1 TABLET BY MOUTH WITH FOOD EVERY 8 HOURS AS NEEDED FOR PAIN   rizatriptan (MAXALT) 10 MG tablet TAKE 1 TABLET BY MOUTH. MAY REPEAT IN 2 HOURS IF NEEDED   tiZANidine (ZANAFLEX) 4 MG tablet tizanidine 4 mg tablet  TAKE 1 TABLET BY MOUTH TWICE A DAY AS NEEDED FOR MUSCLE SPASMS   No facility-administered encounter medications on file as of 02/27/2023.    Allergies: No Known Allergies  Family History: Family History  Problem Relation Age of Onset   Hypertension Maternal Grandmother    Hypertension Maternal Grandfather    Breast cancer Neg Hx     Observations/Objective:   No acute distress.  Alert and oriented.  Speech fluent and not dysarthric.  Language intact.  Eyes orthophoric on primary gaze.  Face symmetric.   Follow Up Instructions:     -I discussed the assessment and treatment plan with the patient. The patient was provided an opportunity to ask questions and all were answered. The patient agreed with the plan and demonstrated an understanding of the instructions.   The patient was advised to call back or seek an in-person evaluation if the symptoms worsen or if the condition fails to improve as anticipated.   Cira Servant, DO

## 2023-02-27 ENCOUNTER — Encounter: Payer: Self-pay | Admitting: Neurology

## 2023-02-27 ENCOUNTER — Telehealth (INDEPENDENT_AMBULATORY_CARE_PROVIDER_SITE_OTHER): Payer: Medicaid Other | Admitting: Neurology

## 2023-02-27 DIAGNOSIS — G43009 Migraine without aura, not intractable, without status migrainosus: Secondary | ICD-10-CM

## 2023-02-27 MED ORDER — EMGALITY 120 MG/ML ~~LOC~~ SOAJ: 120.0000 mg | SUBCUTANEOUS | 11 refills | Status: DC

## 2023-05-04 ENCOUNTER — Telehealth: Payer: Self-pay | Admitting: Neurology

## 2023-05-04 NOTE — Telephone Encounter (Signed)
Patient called stating that her FMLA paperwork goes out tomorrow and is wanting to know what to do for Dr, Everlena Cooper to extend the [facility]

## 2023-05-05 NOTE — Telephone Encounter (Signed)
Patient advised if the paperwork is expiring, have the job to fax over the forms. Please allow two weeks to fill out paperwork.

## 2023-05-18 ENCOUNTER — Ambulatory Visit: Payer: Medicaid Other | Admitting: Neurology

## 2023-05-22 NOTE — Telephone Encounter (Signed)
Patient said Danielle Pollard was going to fax her FMLA paper work over to her job but they have not got it yet. They put the new cartridge in and it should be okay to send now.

## 2023-05-25 NOTE — Telephone Encounter (Signed)
Paperwork faxed over 05/21/23.  Advised patient paperwork faxed over.

## 2023-05-28 ENCOUNTER — Telehealth: Payer: Self-pay | Admitting: Neurology

## 2023-05-28 NOTE — Telephone Encounter (Signed)
Called patient to ask how I can help her because Zenon Mayo is out of the office. Patient stated that she had spoke to [facility] and the FMLA paperwork was faxed, but her job is just not receiving it. Patient asked if we could email her the FMLA but I informed patient that we are unable to send anything via email due to HIPAA. Patient asked if I can refax her FMLA and I informed her that I will.   Patient would like Sheena to mail her a copy of her FMLA so she can hand deliver it to her  job if they do not receive the fax.

## 2023-05-28 NOTE — Telephone Encounter (Signed)
Left message with the after hour service on 05-28-23 at 12:49 pm   Caller states that she needs to speak with Sheena Dr Everlena Cooper Nurse   Called to see what she needs to speak to sheena about but no one answered

## 2023-05-29 NOTE — Telephone Encounter (Signed)
FMLA paperwork attached to a mychart message and sent to patient.

## 2023-09-06 NOTE — Progress Notes (Signed)
 NEUROLOGY FOLLOW UP OFFICE NOTE  Danielle Pollard [id number]  Assessment/Plan:   Migraine without aura, without status migrainosus, not intractable    Migraine prevention: Emgality  every 4 weeks  Migraine rescue: rizatriptan  10mg  and Zofran  4mg . She will try samples of Ubrelvy  to see if more effective.  If she wakes up with migraine, she will try sumatriptan  20mg  nasal spray (not to take within 24 hours of rizatriptan )   Limit use of pain relievers to no more than 2 days out of week to prevent risk of rebound or medication-overuse headache.  Keep headache diary  Exercise, hydration, caffeine cessation, sleep hygiene, monitor for and avoid triggers  Follow up 6 months.  Subjective:  Danielle Pollard is a 38 year old woman with migraines, asthma and sickle cell trait who follows up for migraines.   UPDATE: Never picked up Ubrelvy  samples. Intensity:  severe Duration:  1 to 2 days with rizatriptan .  If she wakes up with migraine, lasts 3 days. Frequency:  1-2 a month.    Current NSAIDS:  ibuprofen  Current analgesics:  acetaminophen  Current triptans:  rizatriptan  10mg  Current ergotamine:  none Current anti-emetic:  none Current muscle relaxants:  tizanidine Current anti-anxiolytic:  none Current sleep aide:  none Current Antihypertensive medications:  none Current Antidepressant medications:  Cymbalta 30mg  daily Current Anticonvulsant medications:  none Current anti-CGRP:  Emgality  Current Vitamins/Herbal/Supplements:  none Current Antihistamines/Decongestants:  none Other therapy:  none Hormone/birth control:  none   Caffeine:  No coffee.  No recent soda. Diet:  Hydrates.  Does not skip meals. Exercise:  routine Depression:  no; Anxiety:  no Other pain:  no Sleep hygiene:  Good.   HISTORY:  Onset:  Middle school Location:  Unilateral (either side) or bifrontal Quality:  Pressure, squeezing, throbbing, pounding Initial Intensity:  10/10.  She denies new  headache, thunderclap headache Aura:  no Premonitory Phase:  no Postdrome:  Fatigue or linger dull headache for 1 to 2 days Associated symptoms:  Photophobia, phonophobia, nausea, vomiting, blurred vision, dizzy when severe.  She denies associated unilateral numbness or weakness. Initial Duration:  2-3 days up to a week Initial Frequency:  Frequent until hysterectomy last year and since then once a month (from 2-3 days up to a week) Frequency of abortive medication: usually 3 days a month Triggers:  Unknown Relieving factors:  Sleep in dark and quiet room, laying on a cold floor Activity:  aggravates   Headaches have been worked up with 2 head CTs.  CT Head from 12/17/11 reportedly was normal.  CT head from 06/07/15 was personally reviewed and was also unremarkable.      Past NSAIDS:  naproxen, Mobic  Past analgesics:  Excedrin Past abortive triptans:  Sumatriptan  100mg  Past abortive ergotamine:  none Past muscle relaxants:  none Past anti-emetic:  Reglan , Zofran , Phenergan  Past antihypertensive medications:  none Past antidepressant medications:  nortriptyline Past anticonvulsant medications:  Topiramate, gabapentin  (for nerve pain) Past anti-CGRP:  none Past vitamins/Herbal/Supplements:  none Past antihistamines/decongestants:  none Other past therapies: Depo-Provera     Family history of headache:  Maternal aunt  PAST MEDICAL HISTORY: Past Medical History:  Diagnosis Date   Anemia    Asthma    WELL CONTROLLED-LAST USED INHALER IN 2012   Migraines    MIGRAINES   Obesity, unspecified 04/18/2014   Sickle cell trait ([facility])     MEDICATIONS: Current Outpatient Medications on File Prior to Visit  Medication Sig Dispense Refill   albuterol  (PROVENTIL  HFA;VENTOLIN  HFA) 108 (90  Base) MCG/ACT inhaler Inhale 2 puffs into the lungs every 6 (six) hours as needed for wheezing or shortness of breath.     DULoxetine (CYMBALTA) 30 MG capsule Take 30 mg by mouth daily.      Galcanezumab -gnlm (EMGALITY ) 120 MG/ML SOAJ Inject 120 mg into the skin every 30 (thirty) days. 1 mL 11   ibuprofen  (ADVIL ) 800 MG tablet ibuprofen  800 mg tablet  TAKE 1 TABLET BY MOUTH WITH FOOD EVERY 8 HOURS AS NEEDED FOR PAIN     rizatriptan  (MAXALT ) 10 MG tablet TAKE 1 TABLET BY MOUTH. MAY REPEAT IN 2 HOURS IF NEEDED 10 tablet 5   tiZANidine (ZANAFLEX) 4 MG tablet tizanidine 4 mg tablet  TAKE 1 TABLET BY MOUTH TWICE A DAY AS NEEDED FOR MUSCLE SPASMS     No current facility-administered medications on file prior to visit.    ALLERGIES: No Known Allergies  FAMILY HISTORY: Family History  Problem Relation Age of Onset   Hypertension Maternal Grandmother    Hypertension Maternal Grandfather    Breast cancer Neg Hx       Objective:  Blood pressure 137/87, pulse 70, height 5\' 4"  (1.626 m), weight 200 lb (90.7 kg), last menstrual period 04/18/2016, SpO2 98%. General: No acute distress.  Patient appears well-groomed.   Head:  Normocephalic/atraumatic Eyes:  Fundi examined but not visualized Neck: supple, no paraspinal tenderness, full range of motion Heart:  Regular rate and rhythm Neurological Exam: alert and oriented.  Speech fluent and not dysarthric, language intact.  CN II-XII intact. Bulk and tone normal, muscle strength 5/5 throughout.  Sensation to light touch intact.  Deep tendon reflexes 2+ throughout, toes downgoing.  Finger to nose testing intact.  Gait normal, Romberg negative.   Janne Members, DO

## 2023-09-07 ENCOUNTER — Encounter: Payer: Self-pay | Admitting: Neurology

## 2023-09-07 ENCOUNTER — Ambulatory Visit: Payer: Medicaid Other | Admitting: Neurology

## 2023-09-07 VITALS — BP 137/87 | HR 70 | Ht 64.0 in | Wt 200.0 lb

## 2023-09-07 DIAGNOSIS — G43009 Migraine without aura, not intractable, without status migrainosus: Secondary | ICD-10-CM | POA: Diagnosis not present

## 2023-09-07 MED ORDER — SUMATRIPTAN 20 MG/ACT NA SOLN: 20.0000 mg | NASAL | 5 refills | Status: DC | PRN

## 2023-09-07 MED ORDER — RIZATRIPTAN BENZOATE 10 MG PO TABS: ORAL_TABLET | ORAL | 5 refills | Status: DC

## 2023-09-07 NOTE — Progress Notes (Signed)
 Medication Samples have been provided to the patient.  Drug name: Ubrelvy        Strength: 100 mg        Qty: 4  LOT: [id number]  Exp.Date: 1/26  Dosing instructions: as needed  The patient has been instructed regarding the correct time, dose, and frequency of taking this medication, including desired effects and most common side effects.   Danielle Pollard 2:28 PM 09/07/2023

## 2023-09-07 NOTE — Patient Instructions (Signed)
 Emgality  Instead of rizatriptan , try Ubrelvy  - take 1 tablet as needed.  May repeat after 2 hours.  Maximum 2 tablets in 24 hours.  Let me know if it works better When you wake up with migraine, use sumatriptan  nasal spray.  Do not take within 24 hours of rizatriptan .

## 2023-11-19 ENCOUNTER — Telehealth: Payer: Self-pay | Admitting: Neurology

## 2023-11-19 NOTE — Telephone Encounter (Signed)
 Pt called in stating she got a sample box with 1 pill in it at her last visit. She stated it worked well and she would like a prescription for it. [facility]

## 2023-11-20 ENCOUNTER — Telehealth: Payer: Self-pay | Admitting: Pharmacy Technician

## 2023-11-20 ENCOUNTER — Other Ambulatory Visit (HOSPITAL_COMMUNITY): Payer: Self-pay

## 2023-11-20 ENCOUNTER — Other Ambulatory Visit: Payer: Self-pay | Admitting: Neurology

## 2023-11-20 MED ORDER — UBRELVY 100 MG PO TABS: 1.0000 | ORAL_TABLET | ORAL | 5 refills | Status: DC | PRN

## 2023-11-20 NOTE — Telephone Encounter (Signed)
 Pharmacy Patient Advocate Encounter  Received notification from [facility] [employer] that Prior Authorization for UBLREVY 100MG  has been APPROVED from 11-06-2023 to 11-19-2024  Quantity limit of 10 tablets per 30 days  PA #/Case ID/Reference #: [id number]

## 2023-11-20 NOTE — Telephone Encounter (Signed)
 Pharmacy Patient Advocate Encounter   Received notification from CoverMyMeds that prior authorization for UBLREVY 100MG  is required/requested.   Insurance verification completed.   The patient is insured through [facility] [place] .   Per test claim: PA required; PA submitted to above mentioned insurance via CoverMyMeds Key/confirmation #/EOC [id number] Status is pending

## 2023-11-24 ENCOUNTER — Telehealth: Payer: Self-pay | Admitting: Pharmacy Technician

## 2023-11-24 NOTE — Telephone Encounter (Signed)
 Pharmacy Patient Advocate Encounter  Received notification from [facility] Medicaid that Prior Authorization for EMGALITY  120MG  has been CANCELLED due to

## 2023-11-24 NOTE — Telephone Encounter (Signed)
 Pharmacy Patient Advocate Encounter   Received notification from CoverMyMeds that prior authorization for EMGALITY  120MG  is required/requested.   Insurance verification completed.   The patient is insured through [facility] [place] .   Per test claim: PA required; PA submitted to above mentioned insurance via CoverMyMeds Key/confirmation #/EOC [id number] Status is pending

## 2023-12-29 ENCOUNTER — Telehealth: Payer: Self-pay | Admitting: Neurology

## 2023-12-29 NOTE — Telephone Encounter (Signed)
 Pt called in stating she has been having a migraine since last week. She will take her medicine and it will go away and then come back the next day. The one she has had today started around 6:30 and will not go away despite taking all of her medication. Is a headache cocktail an option for her?

## 2024-01-05 NOTE — Progress Notes (Unsigned)
 NEUROLOGY FOLLOW UP OFFICE NOTE  Danielle Pollard [id number]  Assessment/Plan:   Migraine without aura, without status migrainosus, not intractable    Migraine prevention: Emgality  every 4 weeks ***  Migraine rescue: Ubrelvy  100mg  and Zofran  4mg . She will try samples of Ubrelvy  to see if more effective.  If she wakes up with migraine, sumatriptan  20mg  nasal spray (not to take within 24 hours of rizatriptan )  *** Limit use of pain relievers to no more than 9 days out of the month to prevent risk of rebound or medication-overuse headache.  Keep headache diary  Exercise, hydration, caffeine cessation, sleep hygiene, monitor for and avoid triggers  Follow up 6 months. ***  Subjective:  Danielle Pollard is a 38 year old woman with migraines, asthma and sickle cell trait who follows up for migraines.   UPDATE:. Intensity:  severe Duration:  1 to 2 days with rizatriptan .  *** with Ubrelvy  samples.  If she wakes up with migraine, lasts *** with sumatriptan  NS Frequency:  1-2 a month.    Current NSAIDS:  ibuprofen  Current analgesics:  acetaminophen  Current triptans:  rizatriptan  10mg , sumatriptan  20mg  NS Current ergotamine:  none Current anti-emetic:  none Current muscle relaxants:  tizanidine Current anti-anxiolytic:  none Current sleep aide:  none Current Antihypertensive medications:  none Current Antidepressant medications:  Cymbalta 30mg  daily Current Anticonvulsant medications:  none Current anti-CGRP:  Emgality , Ubrelvy  100mg  Current Vitamins/Herbal/Supplements:  none Current Antihistamines/Decongestants:  none Other therapy:  none Hormone/birth control:  none   Caffeine:  No coffee.  No recent soda. Diet:  Hydrates.  Does not skip meals. Exercise:  routine Depression:  no; Anxiety:  no Other pain:  no Sleep hygiene:  Good.   HISTORY:  Onset:  Middle school Location:  Unilateral (either side) or bifrontal Quality:  Pressure, squeezing, throbbing,  pounding Initial Intensity:  10/10.  She denies new headache, thunderclap headache Aura:  no Premonitory Phase:  no Postdrome:  Fatigue or linger dull headache for 1 to 2 days Associated symptoms:  Photophobia, phonophobia, nausea, vomiting, blurred vision, dizzy when severe.  She denies associated unilateral numbness or weakness. Initial Duration:  2-3 days up to a week Initial Frequency:  Frequent until hysterectomy last year and since then once a month (from 2-3 days up to a week) Frequency of abortive medication: usually 3 days a month Triggers:  Unknown Relieving factors:  Sleep in dark and quiet room, laying on a cold floor Activity:  aggravates   Headaches have been worked up with 2 head CTs.  CT Head from 12/17/11 reportedly was normal.  CT head from 06/07/15 was personally reviewed and was also unremarkable.      Past NSAIDS:  naproxen, Mobic  Past analgesics:  Excedrin Past abortive triptans:  Sumatriptan  100mg  Past abortive ergotamine:  none Past muscle relaxants:  none Past anti-emetic:  Reglan , Zofran , Phenergan  Past antihypertensive medications:  none Past antidepressant medications:  nortriptyline Past anticonvulsant medications:  Topiramate, gabapentin  (for nerve pain) Past anti-CGRP:  none Past vitamins/Herbal/Supplements:  none Past antihistamines/decongestants:  none Other past therapies: Depo-Provera     Family history of headache:  Maternal aunt  PAST MEDICAL HISTORY: Past Medical History:  Diagnosis Date   Anemia    Asthma    WELL CONTROLLED-LAST USED INHALER IN 2012   Migraines    MIGRAINES   Obesity, unspecified 04/18/2014   Sickle cell trait (HCC)     MEDICATIONS: Current Outpatient Medications on File Prior to Visit  Medication Sig Dispense Refill  albuterol  (PROVENTIL  HFA;VENTOLIN  HFA) 108 (90 Base) MCG/ACT inhaler Inhale 2 puffs into the lungs every 6 (six) hours as needed for wheezing or shortness of breath.     DULoxetine (CYMBALTA) 30 MG  capsule Take 30 mg by mouth daily.     Galcanezumab -gnlm (EMGALITY ) 120 MG/ML SOAJ Inject 120 mg into the skin every 30 (thirty) days. 1 mL 11   ibuprofen  (ADVIL ) 800 MG tablet ibuprofen  800 mg tablet  TAKE 1 TABLET BY MOUTH WITH FOOD EVERY 8 HOURS AS NEEDED FOR PAIN     rizatriptan  (MAXALT ) 10 MG tablet TAKE 1 TABLET BY MOUTH. MAY REPEAT IN 2 HOURS IF NEEDED 10 tablet 5   SUMAtriptan  (IMITREX ) 20 MG/ACT nasal spray Place 1 spray (20 mg total) into the nose as needed for migraine or headache. May repeat in 2 hours if headache persists or recurs.  Maximum 2 sprays in 24 hours. 1 each 5   tiZANidine (ZANAFLEX) 4 MG tablet tizanidine 4 mg tablet  TAKE 1 TABLET BY MOUTH TWICE A DAY AS NEEDED FOR MUSCLE SPASMS     Ubrogepant  (UBRELVY ) 100 MG TABS Take 1 tablet (100 mg total) by mouth as needed. May repeat after 2 hours.  Maximum 2 tablets in 24 hours. 10 tablet 5   No current facility-administered medications on file prior to visit.    ALLERGIES: No Known Allergies  FAMILY HISTORY: Family History  Problem Relation Age of Onset   Hypertension Maternal Grandmother    Hypertension Maternal Grandfather    Breast cancer Neg Hx       Objective:  *** General: No acute distress.  Patient appears well-groomed.   ***   Janne Members, DO

## 2024-01-06 ENCOUNTER — Encounter: Payer: Self-pay | Admitting: Neurology

## 2024-01-06 ENCOUNTER — Ambulatory Visit: Admitting: Neurology

## 2024-01-06 VITALS — BP 129/84 | HR 67 | Ht 64.0 in | Wt 203.0 lb

## 2024-01-06 DIAGNOSIS — G43009 Migraine without aura, not intractable, without status migrainosus: Secondary | ICD-10-CM

## 2024-01-06 NOTE — Patient Instructions (Signed)
 Continue Emgality  Take prednisone  taper Take Ubrelvy  (or rizatriptan ) for migraine attacks. Take sumatriptan  injection if wake up with migraine If no improvement in 4 weeks, contact me

## 2024-02-03 ENCOUNTER — Telehealth: Payer: Self-pay

## 2024-02-03 ENCOUNTER — Other Ambulatory Visit (HOSPITAL_COMMUNITY): Payer: Self-pay

## 2024-02-03 NOTE — Telephone Encounter (Signed)
 Pharmacy Patient Advocate Encounter   Received notification from CoverMyMeds that prior authorization for Emgality  120MG /ML auto-injectors (migraine) is required/requested.   Insurance verification completed.   The patient is insured through [facility] [place] .   Per test claim: PA required; PA submitted to above mentioned insurance via CoverMyMeds Key/confirmation #/EOC [id number] Status is pending

## 2024-02-04 ENCOUNTER — Other Ambulatory Visit: Payer: Self-pay | Admitting: Neurology

## 2024-02-04 MED ORDER — AIMOVIG 140 MG/ML ~~LOC~~ SOAJ: 140.0000 mg | SUBCUTANEOUS | 11 refills | Status: AC

## 2024-02-04 MED ORDER — PREDNISONE 10 MG PO TABS: ORAL_TABLET | ORAL | 0 refills | Status: DC

## 2024-02-04 NOTE — Telephone Encounter (Signed)
 Pharmacy Patient Advocate Encounter  Received notification from [facility] Medicaid that Prior Authorization for Emgality  120MG /ML auto-injectors (migraine) has been APPROVED from 02-03-2024 to 02-02-2025   PA #/Case ID/Reference #: [id number]

## 2024-03-08 ENCOUNTER — Ambulatory Visit: Payer: Medicaid Other | Admitting: Neurology

## 2024-03-31 ENCOUNTER — Encounter: Payer: Self-pay | Admitting: Nurse Practitioner

## 2024-03-31 ENCOUNTER — Ambulatory Visit: Payer: Self-pay | Admitting: Nurse Practitioner

## 2024-03-31 ENCOUNTER — Encounter: Payer: Self-pay | Admitting: Neurology

## 2024-03-31 VITALS — BP 126/82 | Temp 98.3°F | Resp 16 | Ht 64.0 in | Wt 202.4 lb

## 2024-03-31 DIAGNOSIS — E538 Deficiency of other specified B group vitamins: Secondary | ICD-10-CM

## 2024-03-31 DIAGNOSIS — Z8349 Family history of other endocrine, nutritional and metabolic diseases: Secondary | ICD-10-CM

## 2024-03-31 DIAGNOSIS — R5383 Other fatigue: Secondary | ICD-10-CM | POA: Diagnosis not present

## 2024-03-31 DIAGNOSIS — R6881 Early satiety: Secondary | ICD-10-CM

## 2024-03-31 DIAGNOSIS — R7301 Impaired fasting glucose: Secondary | ICD-10-CM

## 2024-03-31 DIAGNOSIS — M545 Low back pain, unspecified: Secondary | ICD-10-CM

## 2024-03-31 DIAGNOSIS — N946 Dysmenorrhea, unspecified: Secondary | ICD-10-CM | POA: Diagnosis not present

## 2024-03-31 DIAGNOSIS — E559 Vitamin D deficiency, unspecified: Secondary | ICD-10-CM

## 2024-03-31 DIAGNOSIS — R112 Nausea with vomiting, unspecified: Secondary | ICD-10-CM | POA: Diagnosis not present

## 2024-03-31 DIAGNOSIS — E782 Mixed hyperlipidemia: Secondary | ICD-10-CM

## 2024-03-31 DIAGNOSIS — G8929 Other chronic pain: Secondary | ICD-10-CM

## 2024-03-31 MED ORDER — METOCLOPRAMIDE HCL 5 MG PO TABS: 5.0000 mg | ORAL_TABLET | Freq: Four times a day (QID) | ORAL | 1 refills | Status: DC | PRN

## 2024-03-31 MED ORDER — ONDANSETRON 4 MG PO TBDP: 4.0000 mg | ORAL_TABLET | Freq: Three times a day (TID) | ORAL | 5 refills | Status: AC | PRN

## 2024-03-31 NOTE — Progress Notes (Signed)
 [facility] [address] [place], [place] [address]  Internal MEDICINE  Office Visit Note  Patient Name: Danielle Pollard  [id number]  [id number]  Date of Service: 03/31/2024   Complaints/HPI Pt is here for establishment of PCP. Chief Complaint  Patient presents with   New Patient (Initial Visit)    Menstrual cycle symptoms, lower back pain, had covid(2022) can't eat- eat once a day     HPI Danielle Pollard presents for a new patient visit to establish care.  Well-appearing 38 y.o. female with low back pain, persistent nausea and vomiting, early satiety, menstrual cramping s/p hysterectomy, and persistent weight gain  Significant FH: thyroid disease Work: [employer]  Home: lives at home alone  Diet: does not drink any soda and has not had any meat in 2 years. She eats healthy. But can only eat 1 meal a day due to persistent nausea and vomiting.  Exercise: works out at [employer] 3 times a week  Tobacco use: none  Alcohol use: none  Illicit drug use: none  Labs: due for routine labs  New or worsening pain: low back pain and cyclic menstrual cramping.  Weight gain -- Low back pain --constant, dull achy pain, but is a 10 out of 10 when it flares up. Denies any radiation to other area, denies sciatica.  Had a hysterectomy but is still getting symptoms of a menstrual cycle every month Had covid in 2022, nausea and vomiting x1 year, seen by GI but nothing was found. 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 Can only tolerate eating once a day. Migraines -- waiting to gt aimovig  approved. Was previously on emgality . Has tried the sumatriptan  nasal spray, oral rizatriptan , and ubrelvy . Has not tried nurtec or qulipta.   Current Medication: Outpatient Encounter Medications as of 03/31/2024  Medication Sig   albuterol  (PROVENTIL  HFA;VENTOLIN  HFA) 108 (90 Base) MCG/ACT inhaler Inhale 2 puffs into the lungs every 6 (six) hours as needed for wheezing or shortness of breath.    metoCLOPramide  (REGLAN ) 5 MG tablet Take 1 tablet (5 mg total) by mouth every 6 (six) hours as needed for nausea or vomiting.   ondansetron  (ZOFRAN -ODT) 4 MG disintegrating tablet Take 1 tablet (4 mg total) by mouth every 8 (eight) hours as needed for nausea or vomiting.   rizatriptan  (MAXALT ) 10 MG tablet TAKE 1 TABLET BY MOUTH. MAY REPEAT IN 2 HOURS IF NEEDED   Ubrogepant  (UBRELVY ) 100 MG TABS Take 1 tablet (100 mg total) by mouth as needed. May repeat after 2 hours.  Maximum 2 tablets in 24 hours.   Erenumab -aooe (AIMOVIG ) 140 MG/ML SOAJ Inject 140 mg into the skin every 28 (twenty-eight) days.   [DISCONTINUED] predniSONE  (DELTASONE ) 10 MG tablet Take 60mg  on day 1, then 50mg  on day 2, then 40mg  on day 3, then 30mg  on day 4, then 20mg  on day 5, then 10mg  on day 6. (Patient not taking: Reported on 03/31/2024)   [DISCONTINUED] SUMAtriptan  (IMITREX ) 20 MG/ACT nasal spray Place 1 spray (20 mg total) into the nose as needed for migraine or headache. May repeat in 2 hours if headache persists or recurs.  Maximum 2 sprays in 24 hours. (Patient not taking: Reported on 03/31/2024)   No facility-administered encounter medications on file as of 03/31/2024.    Surgical History: Past Surgical History:  Procedure Laterality Date   ABDOMINAL HYSTERECTOMY     BREAST BIOPSY Right 06/27/2021   stereo bx , asymmetryX clip-path pending   CESAREAN  SECTION     DIAGNOSTIC LAPAROSCOPY  2012   Dr. Rolm; lysis of adhesions   ESOPHAGOGASTRODUODENOSCOPY (EGD) WITH PROPOFOL  N/A 03/05/2020   Procedure: ESOPHAGOGASTRODUODENOSCOPY (EGD) WITH PROPOFOL ;  Surgeon: Unk Corinn Skiff, MD;  Location: [facility] ENDOSCOPY;  Service: Gastroenterology;  Laterality: N/A;   FOOT TENDON SURGERY     LAPAROSCOPIC HYSTERECTOMY Bilateral 01/05/2018   Procedure: HYSTERECTOMY TOTAL LAPAROSCOPIC, BILATERAL SALPINGECTOMY;  Surgeon: Arloa Lamar SQUIBB, MD;  Location: [facility] ORS;  Service: Gynecology;  Laterality: Bilateral;   LAPAROSCOPY N/A  01/05/2018   Procedure: LAPAROSCOPY DIAGNOSTIC;  Surgeon: Arloa Lamar SQUIBB, MD;  Location: [facility] ORS;  Service: Gynecology;  Laterality: N/A;    Medical History: Past Medical History:  Diagnosis Date   Anemia    Asthma    WELL CONTROLLED-LAST USED INHALER IN 2012   Migraines    MIGRAINES   Obesity, unspecified 04/18/2014   Sickle cell trait (HCC)     Family History: Family History  Problem Relation Age of Onset   Hypertension Maternal Grandmother    Hypertension Maternal Grandfather    Breast cancer Neg Hx     Social History   Socioeconomic History   Marital status: Single    Spouse name: Not on file   Number of children: Not on file   Years of education: Not on file   Highest education level: Not on file  Occupational History   Not on file  Tobacco Use   Smoking status: Never   Smokeless tobacco: Never  Vaping Use   Vaping status: Never Used  Substance and Sexual Activity   Alcohol use: No   Drug use: No   Sexual activity: Yes    Birth control/protection: Surgical    Comment: Hysterectomy  Other Topics Concern   Not on file  Social History Narrative   Right handed   No caffeine   One floor home   Currently employed   Lives with mom, son and nephew   Social Drivers of Corporate investment banker Strain: Low Risk  (12/05/2021)   Received from [facility]   Overall Financial Resource Strain (CARDIA)    Difficulty of Paying Living Expenses: Not hard at all  Food Insecurity: No Food Insecurity (12/05/2021)   Received from [facility]   Hunger Vital Sign    Within the past 12 months, you worried that your food would run out before you got the money to buy more.: Never true    Within the past 12 months, the food you bought just didn't last and you didn't have money to get more.: Never true  Transportation Needs: No Transportation Needs (12/05/2021)   Received from [facility]   PRAPARE - Transportation    Lack of Transportation (Medical): No     Lack of Transportation (Non-Medical): No  Physical Activity: Insufficiently Active (12/05/2021)   Received from [facility]   Exercise Vital Sign    On average, how many days per week do you engage in moderate to strenuous exercise (like a brisk walk)?: 3 days    On average, how many minutes do you engage in exercise at this level?: 30 min  Stress: No Stress Concern Present (12/05/2021)   Received from [facility] - Occupational Stress Questionnaire    Feeling of Stress : Only a little  Social Connections: Moderately Isolated (12/05/2021)   Received from [facility]   Social Connection and Isolation Panel    In  a typical week, how many times do you talk on the phone with family, friends, or neighbors?: More than three times a week    How often do you get together with friends or relatives?: More than three times a week    How often do you attend church or religious services?: More than 4 times per year    Do you belong to any clubs or organizations such as church groups, unions, fraternal or athletic groups, or school groups?: No    How often do you attend meetings of the clubs or organizations you belong to?: Never    Are you married, widowed, divorced, separated, never married, or living with a partner?: Never married  Intimate Partner Violence: Not At Risk (12/05/2021)   Received from [facility]   Humiliation, Afraid, Rape, and Kick questionnaire    Within the last year, have you been afraid of your partner or ex-partner?: No    Within the last year, have you been humiliated or emotionally abused in other ways by your partner or ex-partner?: No    Within the last year, have you been kicked, hit, slapped, or otherwise physically hurt by your partner or ex-partner?: No    Within the last year, have you been raped or forced to have any kind of sexual activity by your partner or ex-partner?: No     Review of Systems  Constitutional:   Positive for fatigue. Negative for chills and unexpected weight change.  HENT:  Negative for congestion, postnasal drip, rhinorrhea, sneezing and sore throat.   Eyes:  Negative for redness.  Respiratory: Negative.  Negative for cough, chest tightness, shortness of breath and wheezing.   Cardiovascular: Negative.  Negative for chest pain and palpitations.  Gastrointestinal:  Negative for abdominal pain, constipation, diarrhea, nausea and vomiting.  Genitourinary:  Positive for menstrual problem. Negative for dysuria and frequency.  Musculoskeletal:  Negative for arthralgias, back pain, joint swelling and neck pain.  Skin:  Negative for rash.  Neurological:  Positive for headaches. Negative for tremors and numbness.  Hematological:  Negative for adenopathy. Does not bruise/bleed easily.  Psychiatric/Behavioral:  Negative for behavioral problems (Depression), sleep disturbance and suicidal ideas. The patient is not nervous/anxious.     Vital Signs: BP 126/82   Temp 98.3 F (36.8 C)   Resp 16   Ht 5' 4 (1.626 m)   Wt 202 lb 6.4 oz (91.8 kg)   LMP 04/18/2016   BMI 34.74 kg/m    Physical Exam Vitals reviewed.  Constitutional:      General: She is not in acute distress.    Appearance: Normal appearance. She is obese. She is not ill-appearing.  HENT:     Head: Normocephalic and atraumatic.  Eyes:     Pupils: Pupils are equal, round, and reactive to light.  Cardiovascular:     Rate and Rhythm: Normal rate and regular rhythm.  Pulmonary:     Effort: Pulmonary effort is normal. No respiratory distress.  Skin:    General: Skin is warm and dry.     Capillary Refill: Capillary refill takes less than 2 seconds.  Neurological:     Mental Status: She is alert and oriented to person, place, and time.  Psychiatric:        Mood and Affect: Mood normal.        Behavior: Behavior normal.       Assessment/Plan: 1. Other fatigue (Primary) Routine and additional labs ordered for  further evaluation  - CBC  with Differential/Platelet - CMP14+EGFR - Lipid Profile - Vitamin D  (25 hydroxy) - B12 and Folate Panel - FSH/LH - Estradiol  - Progesterone  - Cortisol-am, blood - Hgb A1C w/o eAG - TSH + free T4 - DG Lumbar Spine Complete; Future  2. Intractable nausea and vomiting Gastric emptying study ordered. Take zofran  as needed and metoclopramide  as needed  - NM Gastric Emptying; Future - ondansetron  (ZOFRAN -ODT) 4 MG disintegrating tablet; Take 1 tablet (4 mg total) by mouth every 8 (eight) hours as needed for nausea or vomiting.  Dispense: 20 tablet; Refill: 5 - metoCLOPramide  (REGLAN ) 5 MG tablet; Take 1 tablet (5 mg total) by mouth every 6 (six) hours as needed for nausea or vomiting.  Dispense: 90 tablet; Refill: 1  3. Early satiety Gastric emptying study ordered. Take zofran  as needed and metoclopramide  as needed  - NM Gastric Emptying; Future - ondansetron  (ZOFRAN -ODT) 4 MG disintegrating tablet; Take 1 tablet (4 mg total) by mouth every 8 (eight) hours as needed for nausea or vomiting.  Dispense: 20 tablet; Refill: 5 - metoCLOPramide  (REGLAN ) 5 MG tablet; Take 1 tablet (5 mg total) by mouth every 6 (six) hours as needed for nausea or vomiting.  Dispense: 90 tablet; Refill: 1  4. Menstrual cramps Additional labs ordered  - FSH/LH - Estradiol  - Progesterone  - Cortisol-am, blood - TSH + free T4  5. Chronic midline low back pain without sciatica Xray of low back ordered  - DG Lumbar Spine Complete; Future  6. Mixed hyperlipidemia Routine labs ordered  - CBC with Differential/Platelet - CMP14+EGFR - Lipid Profile  7. B12 deficiency Routine labs ordered  - CBC with Differential/Platelet - CMP14+EGFR - Lipid Profile - Vitamin D  (25 hydroxy) - B12 and Folate Panel  8. Impaired fasting glucose Routine lab ordered  - Hgb A1C w/o eAG  9. Vitamin D  deficiency Routine lab ordered  - Vitamin D  (25 hydroxy)  10. Family history of thyroid  disease Routine lab ordered  - TSH + free T4    General Counseling: Danielle Pollard verbalizes understanding of the findings of todays visit and agrees with plan of treatment. I have discussed any further diagnostic evaluation that may be needed or ordered today. We also reviewed her medications today. she has been encouraged to call the office with any questions or concerns that should arise related to todays visit.    Orders Placed This Encounter  Procedures   DG Lumbar Spine Complete   NM Gastric Emptying   CBC with Differential/Platelet   CMP14+EGFR   Lipid Profile   Vitamin D  (25 hydroxy)   B12 and Folate Panel   FSH/LH   Estradiol    Progesterone    Cortisol-am, blood   Hgb A1C w/o eAG   TSH + free T4    Meds ordered this encounter  Medications   ondansetron  (ZOFRAN -ODT) 4 MG disintegrating tablet    Sig: Take 1 tablet (4 mg total) by mouth every 8 (eight) hours as needed for nausea or vomiting.    Dispense:  20 tablet    Refill:  5   metoCLOPramide  (REGLAN ) 5 MG tablet    Sig: Take 1 tablet (5 mg total) by mouth every 6 (six) hours as needed for nausea or vomiting.    Dispense:  90 tablet    Refill:  1    Fill new script today    Return for F/U, Review labs/test, Nitesh Pitstick PCP in 2-3 weeks. .  Time spent:30 Minutes Time spent with patient included reviewing progress notes, labs, imaging  studies, and discussing plan for follow up.   [place] Controlled Substance Database was reviewed by me for overdose risk score (ORS)   This patient was seen by Mardy Maxin, FNP-C in collaboration with Dr. Sigrid Bathe as a part of collaborative care agreement.   Jashanti Clinkscale R. Maxin, MSN, FNP-C Internal Medicine

## 2024-04-04 ENCOUNTER — Encounter: Payer: Self-pay | Admitting: Nurse Practitioner

## 2024-04-06 ENCOUNTER — Other Ambulatory Visit (HOSPITAL_COMMUNITY): Payer: Self-pay

## 2024-04-06 ENCOUNTER — Telehealth: Payer: Self-pay | Admitting: Pharmacy Technician

## 2024-04-06 LAB — CBC WITH DIFFERENTIAL/PLATELET
Basophils Absolute: 0 x10E3/uL (ref 0.0–0.2)
Basos: 1 %
EOS (ABSOLUTE): 0.2 x10E3/uL (ref 0.0–0.4)
Eos: 4 %
Hematocrit: 38 % (ref 34.0–46.6)
Hemoglobin: 12.7 g/dL (ref 11.1–15.9)
Immature Grans (Abs): 0 x10E3/uL (ref 0.0–0.1)
Immature Granulocytes: 0 %
Lymphocytes Absolute: 1.5 x10E3/uL (ref 0.7–3.1)
Lymphs: 41 %
MCH: 32.2 pg (ref 26.6–33.0)
MCHC: 33.4 g/dL (ref 31.5–35.7)
MCV: 96 fL (ref 79–97)
Monocytes Absolute: 0.3 x10E3/uL (ref 0.1–0.9)
Monocytes: 9 %
Neutrophils Absolute: 1.6 x10E3/uL (ref 1.4–7.0)
Neutrophils: 45 %
Platelets: 337 x10E3/uL (ref 150–450)
RBC: 3.94 x10E6/uL (ref 3.77–5.28)
RDW: 12.7 % (ref 11.7–15.4)
WBC: 3.7 x10E3/uL (ref 3.4–10.8)

## 2024-04-06 LAB — FSH/LH
FSH: 6.2 m[IU]/mL
LH: 13.1 m[IU]/mL

## 2024-04-06 LAB — LIPID PANEL
Chol/HDL Ratio: 4.3 ratio (ref 0.0–4.4)
Cholesterol, Total: 154 mg/dL (ref 100–199)
HDL: 36 mg/dL — ABNORMAL LOW (ref >39)
LDL Chol Calc (NIH): 101 mg/dL — ABNORMAL HIGH (ref 0–99)
Triglycerides: 92 mg/dL (ref 0–149)
VLDL Cholesterol Cal: 17 mg/dL (ref 5–40)

## 2024-04-06 LAB — CMP14+EGFR
ALT: 9 IU/L (ref 0–32)
AST: 14 IU/L (ref 0–40)
Albumin: 4 g/dL (ref 3.9–4.9)
Alkaline Phosphatase: 83 IU/L (ref 44–121)
BUN/Creatinine Ratio: 6 — ABNORMAL LOW (ref 9–23)
BUN: 5 mg/dL — ABNORMAL LOW (ref 6–20)
Bilirubin Total: 0.3 mg/dL (ref 0.0–1.2)
CO2: 23 mmol/L (ref 20–29)
Calcium: 9.3 mg/dL (ref 8.7–10.2)
Chloride: 104 mmol/L (ref 96–106)
Creatinine, Ser: 0.83 mg/dL (ref 0.57–1.00)
Globulin, Total: 2.8 g/dL (ref 1.5–4.5)
Glucose: 96 mg/dL (ref 70–99)
Potassium: 4.5 mmol/L (ref 3.5–5.2)
Sodium: 140 mmol/L (ref 134–144)
Total Protein: 6.8 g/dL (ref 6.0–8.5)
eGFR: 94 mL/min/1.73 (ref >59)

## 2024-04-06 LAB — HGB A1C W/O EAG: Hgb A1c MFr Bld: 5.1 % (ref 4.8–5.6)

## 2024-04-06 LAB — B12 AND FOLATE PANEL
Folate: 2.9 ng/mL — ABNORMAL LOW (ref >3.0)
Vitamin B-12: 217 pg/mL — ABNORMAL LOW (ref 232–1245)

## 2024-04-06 LAB — CORTISOL-AM, BLOOD: Cortisol - AM: 12.9 ug/dL (ref 6.2–19.4)

## 2024-04-06 LAB — ESTRADIOL: Estradiol: 56.7 pg/mL

## 2024-04-06 LAB — TSH+FREE T4
Free T4: 1.14 ng/dL (ref 0.82–1.77)
TSH: 1.22 u[IU]/mL (ref 0.450–4.500)

## 2024-04-06 LAB — PROGESTERONE: Progesterone: 0.2 ng/mL

## 2024-04-06 LAB — VITAMIN D 25 HYDROXY (VIT D DEFICIENCY, FRACTURES): Vit D, 25-Hydroxy: 15.5 ng/mL — ABNORMAL LOW (ref 30.0–100.0)

## 2024-04-06 NOTE — Telephone Encounter (Signed)
 Pharmacy Patient Advocate Encounter  Received notification from [facility] MEDICAID that Prior Authorization for AIMOVIG  140MG  has been DENIED.  Full denial letter will be uploaded to the media tab. See denial reason below.    PA #/Case ID/Reference #: [id number]

## 2024-04-06 NOTE — Telephone Encounter (Signed)
 I don't see where a PA request for Aimovig  was submitted in July. PA has been submitted, and telephone encounter has been created. Please see telephone encounter dated 9.10.25.

## 2024-04-06 NOTE — Telephone Encounter (Signed)
 Pharmacy Patient Advocate Encounter   Received notification from Patient Advice Request messages that prior authorization for AIMOVIG  140MG  is required/requested.   Insurance verification completed.   The patient is insured through [facility] MEDICAID .   Per test claim: PA required; PA submitted to above mentioned insurance via Latent Key/confirmation #/EOC [id number] Status is pending

## 2024-04-07 ENCOUNTER — Ambulatory Visit: Payer: Self-pay | Admitting: Nurse Practitioner

## 2024-04-07 NOTE — Progress Notes (Signed)
 Lab results reviewed by provider, will discuss lab results in detail with patient at her upcoming office visit

## 2024-04-08 ENCOUNTER — Other Ambulatory Visit (HOSPITAL_COMMUNITY): Payer: Self-pay

## 2024-04-08 ENCOUNTER — Ambulatory Visit
Admission: RE | Admit: 2024-04-08 | Discharge: 2024-04-08 | Disposition: A | Discharge status: Home / Self Care | Source: Ambulatory Visit | Attending: Nurse Practitioner | Admitting: Nurse Practitioner

## 2024-04-08 DIAGNOSIS — R5383 Other fatigue: Secondary | ICD-10-CM

## 2024-04-08 DIAGNOSIS — G8929 Other chronic pain: Secondary | ICD-10-CM

## 2024-04-08 NOTE — Telephone Encounter (Signed)
 Information has been sent to clinical pharmacist for appeals review. It may take 5-7 days to prepare the necessary documentation to request the appeal from the insurance.

## 2024-04-12 ENCOUNTER — Telehealth: Payer: Self-pay | Admitting: Pharmacist

## 2024-04-12 NOTE — Telephone Encounter (Signed)
 Appeal has been submitted for Aimovig . Will advise when response is received, please be advised that most companies may take 30 days to make a decision. Appeal letter and supporting documentation have been faxed to [phone] on 04/12/2024 @8 :28 am.  Thank you, Devere Pandy, PharmD Clinical Pharmacist  [place]  Direct Dial: [phone]

## 2024-04-14 ENCOUNTER — Ambulatory Visit: Admitting: Nurse Practitioner

## 2024-04-14 ENCOUNTER — Encounter: Payer: Self-pay | Admitting: Nurse Practitioner

## 2024-04-14 VITALS — BP 130/88 | HR 88 | Temp 98.1°F | Resp 16 | Ht 64.0 in | Wt 202.6 lb

## 2024-04-14 DIAGNOSIS — E538 Deficiency of other specified B group vitamins: Secondary | ICD-10-CM | POA: Diagnosis not present

## 2024-04-14 DIAGNOSIS — E559 Vitamin D deficiency, unspecified: Secondary | ICD-10-CM

## 2024-04-14 DIAGNOSIS — B372 Candidiasis of skin and nail: Secondary | ICD-10-CM | POA: Diagnosis not present

## 2024-04-14 DIAGNOSIS — Z23 Encounter for immunization: Secondary | ICD-10-CM | POA: Diagnosis not present

## 2024-04-14 MED ORDER — VITAMIN D (ERGOCALCIFEROL) 1.25 MG (50000 UNIT) PO CAPS
50000.0000 [IU] | ORAL_CAPSULE | ORAL | 1 refills | Status: AC
Start: 2024-04-14 — End: ?

## 2024-04-14 MED ORDER — FOLIC ACID 1 MG PO TABS: 1.0000 mg | ORAL_TABLET | Freq: Every day | ORAL | 1 refills | Status: AC

## 2024-04-14 MED ORDER — CYANOCOBALAMIN 1000 MCG/ML IJ SOLN
1000.0000 ug | Freq: Once | INTRAMUSCULAR | Status: AC
  Administered 2024-04-14: 1000 ug via INTRAMUSCULAR

## 2024-04-14 MED ORDER — CLOTRIMAZOLE-BETAMETHASONE 1-0.05 % EX CREA: 1.0000 | TOPICAL_CREAM | Freq: Two times a day (BID) | CUTANEOUS | 3 refills | Status: DC

## 2024-04-14 NOTE — Progress Notes (Signed)
 [facility] [address] [place], [place] [address]  Internal MEDICINE  Office Visit Note  Patient Name: Danielle Pollard  [id number]  [id number]  Date of Service: 04/14/2024  Chief Complaint  Patient presents with   Follow-up    HPI Jaylianna presents for a follow-up visit for lab results.  Low vitamin D  Low B12 Low folate level Rash under breasts -yeast Waiting for results for lumbar spine xray.     Current Medication: Outpatient Encounter Medications as of 04/14/2024  Medication Sig   clotrimazole -betamethasone  (LOTRISONE ) cream Apply 1 Application topically 2 (two) times daily. To rash under breasts until resolved. And then as needed if rash recurs.   folic acid  (FOLVITE ) 1 MG tablet Take 1 tablet (1 mg total) by mouth daily.   Vitamin D , Ergocalciferol , (DRISDOL ) 1.25 MG (50000 UNIT) CAPS capsule Take 1 capsule (50,000 Units total) by mouth every 7 (seven) days.   albuterol  (PROVENTIL  HFA;VENTOLIN  HFA) 108 (90 Base) MCG/ACT inhaler Inhale 2 puffs into the lungs every 6 (six) hours as needed for wheezing or shortness of breath.   Erenumab -aooe (AIMOVIG ) 140 MG/ML SOAJ Inject 140 mg into the skin every 28 (twenty-eight) days.   metoCLOPramide  (REGLAN ) 5 MG tablet Take 1 tablet (5 mg total) by mouth every 6 (six) hours as needed for nausea or vomiting.   ondansetron  (ZOFRAN -ODT) 4 MG disintegrating tablet Take 1 tablet (4 mg total) by mouth every 8 (eight) hours as needed for nausea or vomiting.   rizatriptan  (MAXALT ) 10 MG tablet TAKE 1 TABLET BY MOUTH. MAY REPEAT IN 2 HOURS IF NEEDED   Ubrogepant  (UBRELVY ) 100 MG TABS Take 1 tablet (100 mg total) by mouth as needed. May repeat after 2 hours.  Maximum 2 tablets in 24 hours.   [EXPIRED] cyanocobalamin  (VITAMIN B12) injection 1,000 mcg    No facility-administered encounter medications on file as of 04/14/2024.    Surgical History: Past Surgical History:  Procedure Laterality Date   ABDOMINAL HYSTERECTOMY      BREAST BIOPSY Right 06/27/2021   stereo bx , asymmetryX clip-path pending   CESAREAN SECTION     DIAGNOSTIC LAPAROSCOPY  2012   Dr. Rolm; lysis of adhesions   ESOPHAGOGASTRODUODENOSCOPY (EGD) WITH PROPOFOL  N/A 03/05/2020   Procedure: ESOPHAGOGASTRODUODENOSCOPY (EGD) WITH PROPOFOL ;  Surgeon: Unk Corinn Skiff, MD;  Location: [facility] ENDOSCOPY;  Service: Gastroenterology;  Laterality: N/A;   FOOT TENDON SURGERY     LAPAROSCOPIC HYSTERECTOMY Bilateral 01/05/2018   Procedure: HYSTERECTOMY TOTAL LAPAROSCOPIC, BILATERAL SALPINGECTOMY;  Surgeon: Arloa Lamar SQUIBB, MD;  Location: [facility] ORS;  Service: Gynecology;  Laterality: Bilateral;   LAPAROSCOPY N/A 01/05/2018   Procedure: LAPAROSCOPY DIAGNOSTIC;  Surgeon: Arloa Lamar SQUIBB, MD;  Location: [facility] ORS;  Service: Gynecology;  Laterality: N/A;    Medical History: Past Medical History:  Diagnosis Date   Anemia    Asthma    WELL CONTROLLED-LAST USED INHALER IN 2012   Migraines    MIGRAINES   Obesity, unspecified 04/18/2014   Sickle cell trait     Family History: Family History  Problem Relation Age of Onset   Hypertension Maternal Grandmother    Hypertension Maternal Grandfather    Breast cancer Neg Hx     Social History   Socioeconomic History   Marital status: Single    Spouse name: Not on file   Number of children: Not on file   Years of education: Not on file   Highest education level: Not on file  Occupational History   Not on file  Tobacco Use   Smoking status: Never   Smokeless tobacco: Never  Vaping Use   Vaping status: Never Used  Substance and Sexual Activity   Alcohol use: No   Drug use: No   Sexual activity: Yes    Birth control/protection: Surgical    Comment: Hysterectomy  Other Topics Concern   Not on file  Social History Narrative   Right handed   No caffeine   One floor home   Currently employed   Lives with mom, son and nephew   Social Drivers of Corporate Investment Banker Strain: Low Risk   (12/05/2021)   Received from [facility]   Overall Financial Resource Strain (CARDIA)    Difficulty of Paying Living Expenses: Not hard at all  Food Insecurity: No Food Insecurity (12/05/2021)   Received from [facility]   Hunger Vital Sign    Within the past 12 months, you worried that your food would run out before you got the money to buy more.: Never true    Within the past 12 months, the food you bought just didn't last and you didn't have money to get more.: Never true  Transportation Needs: No Transportation Needs (12/05/2021)   Received from [facility]   PRAPARE - Transportation    Lack of Transportation (Medical): No    Lack of Transportation (Non-Medical): No  Physical Activity: Insufficiently Active (12/05/2021)   Received from [facility]   Exercise Vital Sign    On average, how many days per week do you engage in moderate to strenuous exercise (like a brisk walk)?: 3 days    On average, how many minutes do you engage in exercise at this level?: 30 min  Stress: No Stress Concern Present (12/05/2021)   Received from [facility] of Occupational Health - Occupational Stress Questionnaire    Feeling of Stress : Only a little  Social Connections: Moderately Isolated (12/05/2021)   Received from [facility]   Social Connection and Isolation Panel    In a typical week, how many times do you talk on the phone with family, friends, or neighbors?: More than three times a week    How often do you get together with friends or relatives?: More than three times a week    How often do you attend church or religious services?: More than 4 times per year    Do you belong to any clubs or organizations such as church groups, unions, fraternal or athletic groups, or school groups?: No    How often do you attend meetings of the clubs or organizations you belong to?: Never    Are you married, widowed, divorced, separated, never married, or living with a  partner?: Never married  Intimate Partner Violence: Not At Risk (12/05/2021)   Received from [facility]   Humiliation, Afraid, Rape, and Kick questionnaire    Within the last year, have you been afraid of your partner or ex-partner?: No    Within the last year, have you been humiliated or emotionally abused in other ways by your partner or ex-partner?: No    Within the last year, have you been kicked, hit, slapped, or otherwise physically hurt by your partner or ex-partner?: No    Within the last year, have you been raped or forced to have any kind of sexual activity by your partner or ex-partner?: No      Review of Systems  Constitutional:  Positive for fatigue. Negative for chills and unexpected weight change.  HENT:  Negative for congestion, postnasal drip, rhinorrhea, sneezing and sore throat.   Eyes:  Negative for redness.  Respiratory: Negative.  Negative for cough, chest tightness, shortness of breath and wheezing.   Cardiovascular: Negative.  Negative for chest pain and palpitations.  Gastrointestinal:  Negative for abdominal pain, constipation, diarrhea, nausea and vomiting.  Genitourinary:  Positive for menstrual problem. Negative for dysuria and frequency.  Musculoskeletal:  Negative for arthralgias, back pain, joint swelling and neck pain.  Skin:  Positive for rash (red rash under breasts).  Neurological:  Positive for headaches. Negative for tremors and numbness.  Hematological:  Negative for adenopathy. Does not bruise/bleed easily.  Psychiatric/Behavioral:  Negative for behavioral problems (Depression), sleep disturbance and suicidal ideas. The patient is not nervous/anxious.     Vital Signs: BP 130/88   Pulse 88   Temp 98.1 F (36.7 C)   Resp 16   Ht 5' 4 (1.626 m)   Wt 202 lb 9.6 oz (91.9 kg)   LMP 04/18/2016   SpO2 97%   BMI 34.78 kg/m    Physical Exam Vitals reviewed.  Constitutional:      General: She is not in acute distress.    Appearance:  Normal appearance. She is obese. She is not ill-appearing.  HENT:     Head: Normocephalic and atraumatic.  Eyes:     Pupils: Pupils are equal, round, and reactive to light.  Cardiovascular:     Rate and Rhythm: Normal rate and regular rhythm.  Pulmonary:     Effort: Pulmonary effort is normal. No respiratory distress.  Skin:    General: Skin is warm and dry.     Capillary Refill: Capillary refill takes less than 2 seconds.     Comments: Yeast rash under breasts.  Neurological:     Mental Status: She is alert and oriented to person, place, and time.  Psychiatric:        Mood and Affect: Mood normal.        Behavior: Behavior normal.        Assessment/Plan: 1. Yeast dermatitis (Primary) Topical treatment prescribed, use as ordered. - clotrimazole -betamethasone  (LOTRISONE ) cream; Apply 1 Application topically 2 (two) times daily. To rash under breasts until resolved. And then as needed if rash recurs.  Dispense: 45 g; Refill: 3  2. Folate deficiency Take folice acid daily as prescribed.  - folic acid  (FOLVITE ) 1 MG tablet; Take 1 tablet (1 mg total) by mouth daily.  Dispense: 90 tablet; Refill: 1  3. B12 deficiency B12 injection administered in office today  - cyanocobalamin  (VITAMIN B12) injection 1,000 mcg  4. Vitamin D  deficiency Take weekly vitamin D  supplement as prescribed.  - Vitamin D , Ergocalciferol , (DRISDOL ) 1.25 MG (50000 UNIT) CAPS capsule; Take 1 capsule (50,000 Units total) by mouth every 7 (seven) days.  Dispense: 12 capsule; Refill: 1  5. Needs flu shot Flu vaccine administered today in office  - Influenza, MDCK, trivalent, PF(Flucelvax egg-free)   General Counseling: [place] verbalizes understanding of the findings of todays visit and agrees with plan of treatment. I have discussed any further diagnostic evaluation that may be needed or ordered today. We also reviewed her medications today. she has been encouraged to call the office with any questions or  concerns that should arise related to todays visit.    Orders Placed This Encounter  Procedures   Influenza, MDCK, trivalent, PF(Flucelvax egg-free)    Meds ordered this encounter  Medications   Vitamin D , Ergocalciferol , (DRISDOL ) 1.25 MG (50000 UNIT) CAPS capsule    Sig: Take 1 capsule (50,000 Units total) by mouth every 7 (seven) days.    Dispense:  12 capsule    Refill:  1    Fill new script.   folic acid  (FOLVITE ) 1 MG tablet    Sig: Take 1 tablet (1 mg total) by mouth daily.    Dispense:  90 tablet    Refill:  1    Fill new script today   cyanocobalamin  (VITAMIN B12) injection 1,000 mcg   clotrimazole -betamethasone  (LOTRISONE ) cream    Sig: Apply 1 Application topically 2 (two) times daily. To rash under breasts until resolved. And then as needed if rash recurs.    Dispense:  45 g    Refill:  3    Fill new script today    Return for weekly B12 x2 start next week, then monthly x5, f/u Nilson Tabora in late october.   Total time spent:30 Minutes Time spent includes review of chart, medications, test results, and follow up plan with the patient.   [place] Controlled Substance Database was reviewed by me.  This patient was seen by Mardy Maxin, FNP-C in collaboration with Dr. Sigrid Bathe as a part of collaborative care agreement.   Kharon Hixon R. Maxin, MSN, FNP-C Internal medicine

## 2024-04-15 ENCOUNTER — Other Ambulatory Visit (HOSPITAL_COMMUNITY): Payer: Self-pay

## 2024-04-20 NOTE — Telephone Encounter (Signed)
 Appeal for [employer]ov[employer]  has been approved by the insurance. Full letter has been uploaded to the media tab.    Thank you, Devere Pandy, PharmD Clinical Pharmacist  [place]  Direct Dial: [phone]

## 2024-04-21 ENCOUNTER — Ambulatory Visit (INDEPENDENT_AMBULATORY_CARE_PROVIDER_SITE_OTHER)

## 2024-04-21 DIAGNOSIS — E538 Deficiency of other specified B group vitamins: Secondary | ICD-10-CM

## 2024-04-21 MED ORDER — CYANOCOBALAMIN 1000 MCG/ML IJ SOLN
1000.0000 ug | Freq: Once | INTRAMUSCULAR | Status: AC
  Administered 2024-04-21: 1000 ug via INTRAMUSCULAR

## 2024-04-22 ENCOUNTER — Encounter
Admission: RE | Admit: 2024-04-22 | Discharge: 2024-04-22 | Disposition: A | Discharge status: Home / Self Care | Source: Ambulatory Visit | Attending: Nurse Practitioner | Admitting: Nurse Practitioner

## 2024-04-22 DIAGNOSIS — R112 Nausea with vomiting, unspecified: Secondary | ICD-10-CM | POA: Insufficient documentation

## 2024-04-22 DIAGNOSIS — R6881 Early satiety: Secondary | ICD-10-CM | POA: Diagnosis present

## 2024-04-22 MED ORDER — TECHNETIUM TC 99M SULFUR COLLOID
2.0700 | Freq: Once | INTRAVENOUS | Status: AC | PRN
Start: 2024-04-22 — End: 2024-04-22
  Administered 2024-04-22: 2.07 via ORAL

## 2024-04-28 ENCOUNTER — Ambulatory Visit (INDEPENDENT_AMBULATORY_CARE_PROVIDER_SITE_OTHER)

## 2024-04-28 DIAGNOSIS — E538 Deficiency of other specified B group vitamins: Secondary | ICD-10-CM

## 2024-04-28 MED ORDER — CYANOCOBALAMIN 1000 MCG/ML IJ SOLN
1000.0000 ug | Freq: Once | INTRAMUSCULAR | Status: AC
  Administered 2024-04-28: 1000 ug via INTRAMUSCULAR

## 2024-05-15 ENCOUNTER — Encounter: Payer: Self-pay | Admitting: Nurse Practitioner

## 2024-05-22 ENCOUNTER — Encounter: Payer: Self-pay | Admitting: Nurse Practitioner

## 2024-05-22 DIAGNOSIS — E559 Vitamin D deficiency, unspecified: Secondary | ICD-10-CM | POA: Insufficient documentation

## 2024-05-22 DIAGNOSIS — E538 Deficiency of other specified B group vitamins: Secondary | ICD-10-CM | POA: Insufficient documentation

## 2024-05-23 ENCOUNTER — Other Ambulatory Visit: Payer: Self-pay | Admitting: Family Medicine

## 2024-05-23 DIAGNOSIS — R109 Unspecified abdominal pain: Secondary | ICD-10-CM

## 2024-05-23 DIAGNOSIS — R1022 Pelvic and perineal pain left side: Secondary | ICD-10-CM

## 2024-05-26 ENCOUNTER — Encounter: Payer: Self-pay | Admitting: Nurse Practitioner

## 2024-05-26 ENCOUNTER — Ambulatory Visit: Admitting: Nurse Practitioner

## 2024-05-26 VITALS — BP 130/84 | HR 88 | Temp 97.4°F | Resp 16 | Ht 64.0 in | Wt 209.0 lb

## 2024-05-26 DIAGNOSIS — R112 Nausea with vomiting, unspecified: Secondary | ICD-10-CM

## 2024-05-26 DIAGNOSIS — R6881 Early satiety: Secondary | ICD-10-CM | POA: Diagnosis not present

## 2024-05-26 DIAGNOSIS — K219 Gastro-esophageal reflux disease without esophagitis: Secondary | ICD-10-CM | POA: Diagnosis not present

## 2024-05-26 DIAGNOSIS — M545 Low back pain, unspecified: Secondary | ICD-10-CM

## 2024-05-26 DIAGNOSIS — E538 Deficiency of other specified B group vitamins: Secondary | ICD-10-CM | POA: Diagnosis not present

## 2024-05-26 DIAGNOSIS — G8929 Other chronic pain: Secondary | ICD-10-CM

## 2024-05-26 DIAGNOSIS — E559 Vitamin D deficiency, unspecified: Secondary | ICD-10-CM

## 2024-05-26 MED ORDER — CYANOCOBALAMIN 1000 MCG/ML IJ SOLN
1000.0000 ug | Freq: Once | INTRAMUSCULAR | Status: AC
  Administered 2024-05-26: 1000 ug via INTRAMUSCULAR

## 2024-05-26 NOTE — Progress Notes (Signed)
 [facility] [address] [place], [place] [address]  Internal MEDICINE  Office Visit Note  Patient Name: Danielle Pollard  [id number]  [id number]  Date of Service: 05/26/2024  Chief Complaint  Patient presents with   Follow-up    HPI Danielle Pollard presents for a follow-up visit for lab results. Vitamin B12 deficiency -- due for B12 injection today  Vitamin D  deficiency -- taking weekly vitamin D  supplement Folate deficiency -- taking folic acid  1 mg daily  GERD, nausea and vomiting and early satiety -- gastric emptying imaging was normal.  Chronic low back pain -- xray of lumbar spine showed mild degenerative changes.     Current Medication: Outpatient Encounter Medications as of 05/26/2024  Medication Sig   Erenumab -aooe (AIMOVIG ) 140 MG/ML SOAJ Inject 140 mg into the skin every 28 (twenty-eight) days.   folic acid  (FOLVITE ) 1 MG tablet Take 1 tablet (1 mg total) by mouth daily.   metoCLOPramide  (REGLAN ) 5 MG tablet Take 1 tablet (5 mg total) by mouth every 6 (six) hours as needed for nausea or vomiting.   ondansetron  (ZOFRAN -ODT) 4 MG disintegrating tablet Take 1 tablet (4 mg total) by mouth every 8 (eight) hours as needed for nausea or vomiting.   rizatriptan  (MAXALT ) 10 MG tablet TAKE 1 TABLET BY MOUTH. MAY REPEAT IN 2 HOURS IF NEEDED   Ubrogepant  (UBRELVY ) 100 MG TABS Take 1 tablet (100 mg total) by mouth as needed. May repeat after 2 hours.  Maximum 2 tablets in 24 hours.   Vitamin D , Ergocalciferol , (DRISDOL ) 1.25 MG (50000 UNIT) CAPS capsule Take 1 capsule (50,000 Units total) by mouth every 7 (seven) days.   [DISCONTINUED] albuterol  (PROVENTIL  HFA;VENTOLIN  HFA) 108 (90 Base) MCG/ACT inhaler Inhale 2 puffs into the lungs every 6 (six) hours as needed for wheezing or shortness of breath. (Patient not taking: Reported on 05/26/2024)   [DISCONTINUED] clotrimazole -betamethasone  (LOTRISONE ) cream Apply 1 Application topically 2 (two) times daily. To rash under breasts  until resolved. And then as needed if rash recurs. (Patient not taking: Reported on 05/26/2024)   Facility-Administered Encounter Medications as of 05/26/2024  Medication   cyanocobalamin  (VITAMIN B12) injection 1,000 mcg    Surgical History: Past Surgical History:  Procedure Laterality Date   ABDOMINAL HYSTERECTOMY     BREAST BIOPSY Right 06/27/2021   stereo bx , asymmetryX clip-path pending   CESAREAN SECTION     DIAGNOSTIC LAPAROSCOPY  2012   Dr. Rolm; lysis of adhesions   ESOPHAGOGASTRODUODENOSCOPY (EGD) WITH PROPOFOL  N/A 03/05/2020   Procedure: ESOPHAGOGASTRODUODENOSCOPY (EGD) WITH PROPOFOL ;  Surgeon: Unk Corinn Skiff, MD;  Location: [facility] ENDOSCOPY;  Service: Gastroenterology;  Laterality: N/A;   FOOT TENDON SURGERY     LAPAROSCOPIC HYSTERECTOMY Bilateral 01/05/2018   Procedure: HYSTERECTOMY TOTAL LAPAROSCOPIC, BILATERAL SALPINGECTOMY;  Surgeon: Arloa Lamar SQUIBB, MD;  Location: [facility] ORS;  Service: Gynecology;  Laterality: Bilateral;   LAPAROSCOPY N/A 01/05/2018   Procedure: LAPAROSCOPY DIAGNOSTIC;  Surgeon: Arloa Lamar SQUIBB, MD;  Location: [facility] ORS;  Service: Gynecology;  Laterality: N/A;    Medical History: Past Medical History:  Diagnosis Date   Anemia    Asthma    WELL CONTROLLED-LAST USED INHALER IN 2012   Migraines    MIGRAINES   Obesity, unspecified 04/18/2014   Sickle cell trait     Family History: Family History  Problem Relation Age of Onset   Hypertension Maternal Grandmother    Hypertension Maternal Grandfather    Breast cancer Neg Hx     Social History  Socioeconomic History   Marital status: Single    Spouse name: Not on file   Number of children: Not on file   Years of education: Not on file   Highest education level: Not on file  Occupational History   Not on file  Tobacco Use   Smoking status: Never   Smokeless tobacco: Never  Vaping Use   Vaping status: Never Used  Substance and Sexual Activity   Alcohol use: No   Drug use:  No   Sexual activity: Yes    Birth control/protection: Surgical    Comment: Hysterectomy  Other Topics Concern   Not on file  Social History Narrative   Right handed   No caffeine   One floor home   Currently employed   Lives with mom, son and nephew   Social Drivers of Corporate Investment Banker Strain: Low Risk  (12/05/2021)   Received from [facility]   Overall Financial Resource Strain (CARDIA)    Difficulty of Paying Living Expenses: Not hard at all  Food Insecurity: No Food Insecurity (12/05/2021)   Received from [facility]   Hunger Vital Sign    Within the past 12 months, you worried that your food would run out before you got the money to buy more.: Never true    Within the past 12 months, the food you bought just didn't last and you didn't have money to get more.: Never true  Transportation Needs: No Transportation Needs (12/05/2021)   Received from [facility]   PRAPARE - Transportation    Lack of Transportation (Medical): No    Lack of Transportation (Non-Medical): No  Physical Activity: Insufficiently Active (12/05/2021)   Received from [facility]   Exercise Vital Sign    On average, how many days per week do you engage in moderate to strenuous exercise (like a brisk walk)?: 3 days    On average, how many minutes do you engage in exercise at this level?: 30 min  Stress: No Stress Concern Present (12/05/2021)   Received from [facility] of Occupational Health - Occupational Stress Questionnaire    Feeling of Stress : Only a little  Social Connections: Moderately Isolated (12/05/2021)   Received from [facility]   Social Connection and Isolation Panel    In a typical week, how many times do you talk on the phone with family, friends, or neighbors?: More than three times a week    How often do you get together with friends or relatives?: More than three times a week    How often do you attend church or religious services?:  More than 4 times per year    Do you belong to any clubs or organizations such as church groups, unions, fraternal or athletic groups, or school groups?: No    How often do you attend meetings of the clubs or organizations you belong to?: Never    Are you married, widowed, divorced, separated, never married, or living with a partner?: Never married  Intimate Partner Violence: Not At Risk (12/05/2021)   Received from [facility]   Humiliation, Afraid, Rape, and Kick questionnaire    Within the last year, have you been afraid of your partner or ex-partner?: No    Within the last year, have you been humiliated or emotionally abused in other ways by your partner or ex-partner?: No    Within the last year, have you been kicked, hit,  slapped, or otherwise physically hurt by your partner or ex-partner?: No    Within the last year, have you been raped or forced to have any kind of sexual activity by your partner or ex-partner?: No      Review of Systems  Constitutional:  Positive for fatigue. Negative for chills and unexpected weight change.  HENT:  Negative for congestion, postnasal drip, rhinorrhea, sneezing and sore throat.   Eyes:  Negative for redness.  Respiratory: Negative.  Negative for cough, chest tightness, shortness of breath and wheezing.   Cardiovascular: Negative.  Negative for chest pain and palpitations.  Gastrointestinal:  Negative for abdominal pain, constipation, diarrhea, nausea and vomiting.  Genitourinary:  Positive for menstrual problem. Negative for dysuria and frequency.  Musculoskeletal:  Negative for arthralgias, back pain, joint swelling and neck pain.  Skin:  Negative for rash.  Neurological:  Positive for headaches. Negative for tremors and numbness.  Hematological:  Negative for adenopathy. Does not bruise/bleed easily.  Psychiatric/Behavioral:  Negative for behavioral problems (Depression), sleep disturbance and suicidal ideas. The patient is not  nervous/anxious.     Vital Signs: BP 130/84   Pulse 88   Temp (!) 97.4 F (36.3 C)   Resp 16   Ht 5' 4 (1.626 m)   Wt 209 lb (94.8 kg)   LMP 04/18/2016   SpO2 98%   BMI 35.87 kg/m    Physical Exam Vitals reviewed.  Constitutional:      General: She is not in acute distress.    Appearance: Normal appearance. She is obese. She is not ill-appearing.  HENT:     Head: Normocephalic and atraumatic.  Eyes:     Pupils: Pupils are equal, round, and reactive to light.  Cardiovascular:     Rate and Rhythm: Normal rate and regular rhythm.  Pulmonary:     Effort: Pulmonary effort is normal. No respiratory distress.  Skin:    General: Skin is warm and dry.     Capillary Refill: Capillary refill takes less than 2 seconds.  Neurological:     Mental Status: She is alert and oriented to person, place, and time.  Psychiatric:        Mood and Affect: Mood normal.        Behavior: Behavior normal.        Assessment/Plan: 1. Intractable nausea and vomiting (Primary) Referred to GI for further evaluation  - Ambulatory referral to Gastroenterology  2. Early satiety Referred to GI for further evaluation  - Ambulatory referral to Gastroenterology  3. Gastroesophageal reflux disease without esophagitis Referred to GI for further evaluation  - Ambulatory referral to Gastroenterology  4. Chronic midline low back pain without sciatica Continue OTC ibuprofen  or tylenol  as needed.   5. B12 deficiency B12 injection administered today in office  - cyanocobalamin  (VITAMIN B12) injection 1,000 mcg  6. Folate deficiency Continue folic acid  as prescribed.   7. Vitamin D  deficiency Continue weekly vitamin D  supplement as prescribed.    General Counseling: [place] verbalizes understanding of the findings of todays visit and agrees with plan of treatment. I have discussed any further diagnostic evaluation that may be needed or ordered today. We also reviewed her medications today. she  has been encouraged to call the office with any questions or concerns that should arise related to todays visit.    Orders Placed This Encounter  Procedures   Ambulatory referral to Gastroenterology    Meds ordered this encounter  Medications   cyanocobalamin  (VITAMIN B12) injection 1,000  mcg    Return in about 6 months (around 11/24/2024) for F/U, Tasheema Perrone PCP and also need monthly B12 inj nurse visit x4 after inj today .   Total time spent:30 Minutes Time spent includes review of chart, medications, test results, and follow up plan with the patient.   [place] Controlled Substance Database was reviewed by me.  This patient was seen by Mardy Maxin, FNP-C in collaboration with Dr. Sigrid Bathe as a part of collaborative care agreement.   Obrien Huskins R. Maxin, MSN, FNP-C Internal medicine

## 2024-05-27 ENCOUNTER — Telehealth: Payer: Self-pay | Admitting: Nurse Practitioner

## 2024-05-27 NOTE — Telephone Encounter (Signed)
 Awaiting 05/26/24 office notes for GI referral-Toni

## 2024-06-01 ENCOUNTER — Encounter: Payer: Self-pay | Admitting: Nurse Practitioner

## 2024-06-01 DIAGNOSIS — R112 Nausea with vomiting, unspecified: Secondary | ICD-10-CM | POA: Insufficient documentation

## 2024-06-01 DIAGNOSIS — R6881 Early satiety: Secondary | ICD-10-CM | POA: Insufficient documentation

## 2024-06-13 ENCOUNTER — Encounter: Payer: Self-pay | Admitting: Internal Medicine

## 2024-06-13 ENCOUNTER — Ambulatory Visit: Admitting: Internal Medicine

## 2024-06-13 VITALS — BP 110/83 | HR 70 | Temp 98.0°F | Resp 16 | Ht 64.0 in | Wt 203.6 lb

## 2024-06-13 DIAGNOSIS — R1112 Projectile vomiting: Secondary | ICD-10-CM | POA: Diagnosis not present

## 2024-06-13 DIAGNOSIS — E569 Vitamin deficiency, unspecified: Secondary | ICD-10-CM

## 2024-06-13 DIAGNOSIS — G43E11 Chronic migraine with aura, intractable, with status migrainosus: Secondary | ICD-10-CM | POA: Diagnosis not present

## 2024-06-13 DIAGNOSIS — K5901 Slow transit constipation: Secondary | ICD-10-CM

## 2024-06-13 MED ORDER — LINACLOTIDE 72 MCG PO CAPS: 72.0000 ug | ORAL_CAPSULE | Freq: Every day | ORAL | 3 refills | Status: AC

## 2024-06-13 NOTE — Progress Notes (Signed)
 [facility] [address] [place], [place] [address]  Internal MEDICINE  Office Visit Note  Patient Name: Danielle Pollard  [id number]  [id number]  Date of Service: 06/13/2024  Chief Complaint  Patient presents with   Follow-up    HPI Pt is seen for multiple medical problem Seen by neurology for migraine headache, seems to be under control on current combination Intractable nausea since Covid, has been on Reglan  She did have EGD, and gastric emptying studying done, which is reported to be normal She denies any problem with sleeping Has been watching her diet, complains of constipation and going to the bathroom 3 times a week    Current Medication: Outpatient Encounter Medications as of 06/13/2024  Medication Sig   Erenumab -aooe (AIMOVIG ) 140 MG/ML SOAJ Inject 140 mg into the skin every 28 (twenty-eight) days.   folic acid  (FOLVITE ) 1 MG tablet Take 1 tablet (1 mg total) by mouth daily.   linaclotide (LINZESS) 72 MCG capsule Take 1 capsule (72 mcg total) by mouth daily before breakfast.   ondansetron  (ZOFRAN -ODT) 4 MG disintegrating tablet Take 1 tablet (4 mg total) by mouth every 8 (eight) hours as needed for nausea or vomiting.   rizatriptan  (MAXALT ) 10 MG tablet TAKE 1 TABLET BY MOUTH. MAY REPEAT IN 2 HOURS IF NEEDED   Ubrogepant  (UBRELVY ) 100 MG TABS Take 1 tablet (100 mg total) by mouth as needed. May repeat after 2 hours.  Maximum 2 tablets in 24 hours.   Vitamin D , Ergocalciferol , (DRISDOL ) 1.25 MG (50000 UNIT) CAPS capsule Take 1 capsule (50,000 Units total) by mouth every 7 (seven) days.   [DISCONTINUED] metoCLOPramide  (REGLAN ) 5 MG tablet Take 1 tablet (5 mg total) by mouth every 6 (six) hours as needed for nausea or vomiting.   No facility-administered encounter medications on file as of 06/13/2024.    Surgical History: Past Surgical History:  Procedure Laterality Date   ABDOMINAL HYSTERECTOMY     BREAST BIOPSY Right 06/27/2021   stereo bx ,  asymmetryX clip-path pending   CESAREAN SECTION     DIAGNOSTIC LAPAROSCOPY  2012   Dr. Rolm; lysis of adhesions   ESOPHAGOGASTRODUODENOSCOPY (EGD) WITH PROPOFOL  N/A 03/05/2020   Procedure: ESOPHAGOGASTRODUODENOSCOPY (EGD) WITH PROPOFOL ;  Surgeon: Unk Corinn Skiff, MD;  Location[facility] ENDOSCOPY;  Service: Gastroenterology;  Laterality: N/A;   FOOT TENDON SURGERY     LAPAROSCOPIC HYSTERECTOMY Bilateral 01/05/2018   Procedure: HYSTERECTOMY TOTAL LAPAROSCOPIC, BILATERAL SALPINGECTOMY;  Surgeon: Arloa Lamar SQUIBB, MD;  Location: [facility] ORS;  Service: Gynecology;  Laterality: Bilateral;   LAPAROSCOPY N/A 01/05/2018   Procedure: LAPAROSCOPY DIAGNOSTIC;  Surgeon: Arloa Lamar SQUIBB, MD;  Location: [facility] ORS;  Service: Gynecology;  Laterality: N/A;    Medical History: Past Medical History:  Diagnosis Date   Anemia    Asthma    WELL CONTROLLED-LAST USED INHALER IN 2012   Migraines    MIGRAINES   Obesity, unspecified 04/18/2014   Sickle cell trait     Family History: Family History  Problem Relation Age of Onset   Hypertension Maternal Grandmother    Hypertension Maternal Grandfather    Breast cancer Neg Hx     Social History   Socioeconomic History   Marital status: Single    Spouse name: Not on file   Number of children: Not on file   Years of education: Not on file   Highest education level: Not on file  Occupational History   Not on file  Tobacco Use   Smoking status: Never  Smokeless tobacco: Never  Vaping Use   Vaping status: Never Used  Substance and Sexual Activity   Alcohol use: No   Drug use: No   Sexual activity: Yes    Birth control/protection: Surgical    Comment: Hysterectomy  Other Topics Concern   Not on file  Social History Narrative   Right handed   No caffeine   One floor home   Currently employed   Lives with mom, son and nephew   Social Drivers of Corporate Investment Banker Strain: Low Risk (12/05/2021)   Received from [facility]    Overall Financial Resource Strain (CARDIA)    Difficulty of Paying Living Expenses: Not hard at all  Food Insecurity: No Food Insecurity (12/05/2021)   Received from [facility]   Hunger Vital Sign    Within the past 12 months, you worried that your food would run out before you got the money to buy more.: Never true    Within the past 12 months, the food you bought just didn't last and you didn't have money to get more.: Never true  Transportation Needs: No Transportation Needs (12/05/2021)   Received from [facility]   PRAPARE - Transportation    Lack of Transportation (Medical): No    Lack of Transportation (Non-Medical): No  Physical Activity: Insufficiently Active (12/05/2021)   Received [facility]   Exercise Vital Sign    On average, how many days per week do you engage in moderate to strenuous exercise (like a brisk walk)?: 3 days    On average, how many minutes do you engage in exercise at this level?: 30 min  Stress: No Stress Concern Present (12/05/2021)   Received from [facility] - Occupational Stress Questionnaire    Feeling of Stress : Only a little  Social Connections: Moderately Isolated (12/05/2021)   Received from [facility]   Social Connection and Isolation Panel    In a typical week, how many times do you talk on the phone with family, friends, or neighbors?: More than three times a week    How often do you get together with friends or relatives?: More than three times a week    How often do you attend church or religious services?: More than 4 times per year    Do you belong to any clubs or organizations such as church groups, unions, fraternal or athletic groups, or school groups?: No    How often do you attend meetings of the clubs or organizations you belong to?: Never    Are you married, widowed, divorced, separated, never married, or living with a partner?: Never married  Intimate Partner  Violence: Not At Risk (12/05/2021)   Received from [facility]   Humiliation, Afraid, Rape, and Kick questionnaire    Within the last year, have you been afraid of your partner or ex-partner?: No    Within the last year, have you been humiliated or emotionally abused in other ways by your partner or ex-partner?: No    Within the last year, have you been kicked, hit, slapped, or otherwise physically hurt by your partner or ex-partner?: No    Within the last year, have you been raped or forced to have any kind of sexual activity by your partner or ex-partner?: No      Review of Systems  Constitutional:  Negative for chills, fatigue and unexpected weight change.  HENT:  Positive for postnasal drip. Negative for congestion, rhinorrhea, sneezing and sore throat.   Eyes:  Negative for redness.  Respiratory:  Negative for cough, chest tightness and shortness of breath.   Cardiovascular:  Negative for chest pain and palpitations.  Gastrointestinal:  Positive for vomiting. Negative for abdominal pain, constipation, diarrhea and nausea.  Genitourinary:  Negative for dysuria and frequency.  Musculoskeletal:  Negative for arthralgias, back pain, joint swelling and neck pain.  Skin:  Negative for rash.  Neurological: Negative.  Negative for tremors and numbness.  Hematological:  Negative for adenopathy. Does not bruise/bleed easily.  Psychiatric/Behavioral:  Negative for behavioral problems (Depression), sleep disturbance and suicidal ideas. The patient is not nervous/anxious.     Vital Signs: BP 110/83   Pulse 70   Temp 98 F (36.7 C)   Resp 16   Ht 5' 4 (1.626 m)   Wt 203 lb 9.6 oz (92.4 kg)   LMP 04/18/2016   SpO2 96%   BMI 34.95 kg/m    Physical Exam Constitutional:      Appearance: Normal appearance.  HENT:     Head: Normocephalic and atraumatic.     Nose: Nose normal.     Mouth/Throat:     Mouth: Mucous membranes are moist.     Pharynx: No posterior oropharyngeal  erythema.  Eyes:     Extraocular Movements: Extraocular movements intact.     Pupils: Pupils are equal, round, and reactive to light.  Cardiovascular:     Pulses: Normal pulses.     Heart sounds: Normal heart sounds.  Pulmonary:     Effort: Pulmonary effort is normal.     Breath sounds: Normal breath sounds.  Abdominal:     Tenderness: There is abdominal tenderness.  Neurological:     General: No focal deficit present.     Mental Status: She is alert.  Psychiatric:        Mood and Affect: Mood normal.        Behavior: Behavior normal.        Assessment/Plan: 1. Projectile vomiting with nausea (Primary) Patient has been seen by GI EGD and gastric emptying test normal, I do not see an ultrasound of the gallbladder and liver, patient might need a HIDA scan to establish dysmotility syndrome - US  Abdomen Complete; Future  2. Intractable chronic migraine with aura with status migrainosus Patient will continue to see neurology, instructed to only take Zofran  Reglan  is discontinued today  3. Multiple vitamin deficiency Patient has a follow-up B12 and folic acid  level, will check other vitamin level on next visit  4. Slow transit constipation Started on Linzess for constipation - linaclotide (LINZESS) 72 MCG capsule; Take 1 capsule (72 mcg total) by mouth daily before breakfast.  Dispense: 30 capsule; Refill: 3   General Counseling: Mitchelle verbalizes understanding of the findings of todays visit and agrees with plan of treatment. I have discussed any further diagnostic evaluation that may be needed or ordered today. We also reviewed her medications today. she has been encouraged to call the office with any questions or concerns that should arise related to todays visit.    Orders Placed This Encounter  Procedures   US  Abdomen Complete    Meds ordered this encounter  Medications   linaclotide (LINZESS) 72 MCG capsule    Sig: Take 1 capsule (72 mcg total) by mouth daily before  breakfast.    Dispense:  30 capsule    Refill:  3    Total time spent:30 Minutes  Time spent includes review of chart, medications, test results, and follow up plan with the patient.   [place] Controlled Substance Database was reviewed by me.   Dr Emelly Wurtz M Nekesha Font Internal medicine

## 2024-06-15 ENCOUNTER — Telehealth: Payer: Self-pay | Admitting: Nurse Practitioner

## 2024-06-15 NOTE — Telephone Encounter (Signed)
 Gastroenterology referral sent via [facility]. Notified patient. Gave patient telephone # [phone]

## 2024-06-20 ENCOUNTER — Ambulatory Visit
Admission: RE | Admit: 2024-06-20 | Discharge: 2024-06-20 | Disposition: A | Discharge status: Home / Self Care | Source: Ambulatory Visit | Attending: Internal Medicine | Admitting: Internal Medicine

## 2024-06-20 DIAGNOSIS — R1112 Projectile vomiting: Secondary | ICD-10-CM | POA: Insufficient documentation

## 2024-06-27 ENCOUNTER — Ambulatory Visit (INDEPENDENT_AMBULATORY_CARE_PROVIDER_SITE_OTHER)

## 2024-06-27 DIAGNOSIS — E538 Deficiency of other specified B group vitamins: Secondary | ICD-10-CM

## 2024-06-27 MED ORDER — CYANOCOBALAMIN 1000 MCG/ML IJ SOLN
1000.0000 ug | Freq: Once | INTRAMUSCULAR | Status: AC
  Administered 2024-06-27: 1000 ug via INTRAMUSCULAR

## 2024-06-29 ENCOUNTER — Telehealth: Payer: Self-pay | Admitting: Nurse Practitioner

## 2024-06-29 NOTE — Telephone Encounter (Signed)
 GI appointment 09/06/2024 with [facility]

## 2024-07-07 ENCOUNTER — Telehealth: Payer: Self-pay

## 2024-07-11 ENCOUNTER — Other Ambulatory Visit: Payer: Self-pay | Admitting: Nurse Practitioner

## 2024-07-11 ENCOUNTER — Telehealth: Payer: Self-pay | Admitting: Nurse Practitioner

## 2024-07-11 DIAGNOSIS — R932 Abnormal findings on diagnostic imaging of liver and biliary tract: Secondary | ICD-10-CM

## 2024-07-11 DIAGNOSIS — K769 Liver disease, unspecified: Secondary | ICD-10-CM

## 2024-07-11 NOTE — Telephone Encounter (Signed)
 Mri order

## 2024-07-11 NOTE — Telephone Encounter (Signed)
 Notified patient of MRI appointment date, arrival time, location and npo 4 hours prior-Danielle Pollard

## 2024-07-14 ENCOUNTER — Ambulatory Visit: Admission: RE | Admit: 2024-07-14 | Discharge: 2024-07-14 | Attending: Nurse Practitioner

## 2024-07-14 DIAGNOSIS — R932 Abnormal findings on diagnostic imaging of liver and biliary tract: Secondary | ICD-10-CM | POA: Diagnosis present

## 2024-07-14 DIAGNOSIS — K769 Liver disease, unspecified: Secondary | ICD-10-CM | POA: Insufficient documentation

## 2024-07-14 MED ORDER — GADOBUTROL 1 MMOL/ML IV SOLN
9.0000 mL | Freq: Once | INTRAVENOUS | Status: AC | PRN
  Administered 2024-07-14: 10:00:00 9 mL via INTRAVENOUS

## 2024-07-16 ENCOUNTER — Other Ambulatory Visit: Payer: Self-pay | Admitting: Neurology

## 2024-08-01 ENCOUNTER — Ambulatory Visit

## 2024-08-01 DIAGNOSIS — E538 Deficiency of other specified B group vitamins: Secondary | ICD-10-CM | POA: Diagnosis not present

## 2024-08-01 MED ORDER — CYANOCOBALAMIN 1000 MCG/ML IJ SOLN
1000.0000 ug | Freq: Once | INTRAMUSCULAR | Status: AC
  Administered 2024-08-01: 1000 ug via INTRAMUSCULAR

## 2024-08-05 ENCOUNTER — Encounter: Payer: Self-pay | Admitting: Nurse Practitioner

## 2024-08-05 ENCOUNTER — Ambulatory Visit: Admitting: Nurse Practitioner

## 2024-08-05 VITALS — BP 130/86 | Temp 96.9°F | Resp 16 | Ht 64.0 in | Wt 208.0 lb

## 2024-08-05 DIAGNOSIS — Z6835 Body mass index (BMI) 35.0-35.9, adult: Secondary | ICD-10-CM | POA: Diagnosis not present

## 2024-08-05 DIAGNOSIS — B9689 Other specified bacterial agents as the cause of diseases classified elsewhere: Secondary | ICD-10-CM | POA: Insufficient documentation

## 2024-08-05 DIAGNOSIS — R61 Generalized hyperhidrosis: Secondary | ICD-10-CM | POA: Diagnosis not present

## 2024-08-05 DIAGNOSIS — D1803 Hemangioma of intra-abdominal structures: Secondary | ICD-10-CM | POA: Diagnosis not present

## 2024-08-05 DIAGNOSIS — E66812 Obesity, class 2: Secondary | ICD-10-CM

## 2024-08-05 DIAGNOSIS — N760 Acute vaginitis: Secondary | ICD-10-CM

## 2024-08-05 MED ORDER — SOFPIRONIUM BROMIDE 12.45 % EX GEL: 1.0000 | Freq: Every day | CUTANEOUS | 5 refills | Status: AC

## 2024-08-05 MED ORDER — WEGOVY 0.5 MG/0.5ML ~~LOC~~ SOAJ: 0.5000 mg | SUBCUTANEOUS | 5 refills | Status: AC

## 2024-08-05 NOTE — Progress Notes (Signed)
 [facility] [address] [place], [place] [address]  Internal MEDICINE  Office Visit Note  Patient Name: Danielle Pollard  [id number]  [id number]  Date of Service: 08/05/2024  Chief Complaint  Patient presents with   Follow-up    Review MRI    HPI Danielle Pollard presents for a follow-up visit for liver MRI results, weight loss, bacterial vaginosis and excessive sweating.  MRI of liver -- the 2 lesions seen on the liver ultrasound correspond with hemangiomas and no further follow up is required. Hepatomegaly -- mild enlargement of the liver. Weight loss -- can go on wegovy  now that it is on medicaid formulary again.  Low folate level Low B12 level Low vitamin D  level Recurrent episodes of bacterial vaginosis Excessive sweating under breasts -- has tried certain dri and multiple other prescription strength antiperspirant.    Current Medication: Outpatient Encounter Medications as of 08/05/2024  Medication Sig   semaglutide -weight management (WEGOVY ) 0.5 MG/0.5ML SOAJ SQ injection Inject 0.5 mg into the skin once a week.   Sofpironium  Bromide 12.45 % GEL Apply 1 Application topically daily. At bedtime to the skin under the breast for excessive sweating.   Erenumab -aooe (AIMOVIG ) 140 MG/ML SOAJ Inject 140 mg into the skin every 28 (twenty-eight) days.   folic acid  (FOLVITE ) 1 MG tablet Take 1 tablet (1 mg total) by mouth daily.   linaclotide  (LINZESS ) 72 MCG capsule Take 1 capsule (72 mcg total) by mouth daily before breakfast.   ondansetron  (ZOFRAN -ODT) 4 MG disintegrating tablet Take 1 tablet (4 mg total) by mouth every 8 (eight) hours as needed for nausea or vomiting.   rizatriptan  (MAXALT ) 10 MG tablet TAKE 1 TABLET BY MOUTH. MAY REPEAT IN 2 HOURS IF NEEDED   Ubrogepant  (UBRELVY ) 100 MG TABS Take 1 tablet (100 mg total) by mouth as needed. May repeat after 2 hours.  Maximum 2 tablets in 24 hours.   Vitamin D , Ergocalciferol , (DRISDOL ) 1.25 MG (50000 UNIT) CAPS capsule Take  1 capsule (50,000 Units total) by mouth every 7 (seven) days.   No facility-administered encounter medications on file as of 08/05/2024.    Surgical History: Past Surgical History:  Procedure Laterality Date   ABDOMINAL HYSTERECTOMY     BREAST BIOPSY Right 06/27/2021   stereo bx , asymmetryX clip-path pending   CESAREAN SECTION     DIAGNOSTIC LAPAROSCOPY  2012   Dr. Rolm; lysis of adhesions   ESOPHAGOGASTRODUODENOSCOPY (EGD) WITH PROPOFOL  N/A 03/05/2020   Procedure: ESOPHAGOGASTRODUODENOSCOPY (EGD) WITH PROPOFOL ;  Surgeon: Unk Corinn Skiff, MD;  Location: [facility] ENDOSCOPY;  Service: Gastroenterology;  Laterality: N/A;   FOOT TENDON SURGERY     LAPAROSCOPIC HYSTERECTOMY Bilateral 01/05/2018   Procedure: HYSTERECTOMY TOTAL LAPAROSCOPIC, BILATERAL SALPINGECTOMY;  Surgeon: Arloa Lamar SQUIBB, MD;  Location: [facility] ORS;  Service: Gynecology;  Laterality: Bilateral;   LAPAROSCOPY N/A 01/05/2018   Procedure: LAPAROSCOPY DIAGNOSTIC;  Surgeon: Arloa Lamar SQUIBB, MD;  Location: [facility] ORS;  Service: Gynecology;  Laterality: N/A;    Medical History: Past Medical History:  Diagnosis Date   Anemia    Asthma    WELL CONTROLLED-LAST USED INHALER IN 2012   Migraines    MIGRAINES   Obesity, unspecified 04/18/2014   Sickle cell trait     Family History: Family History  Problem Relation Age of Onset   Hypertension Maternal Grandmother    Hypertension Maternal Grandfather    Breast cancer Neg Hx     Social History   Socioeconomic History   Marital status: Single  Spouse name: Not on file   Number of children: Not on file   Years of education: Not on file   Highest education level: Not on file  Occupational History   Not on file  Tobacco Use   Smoking status: Never   Smokeless tobacco: Never  Vaping Use   Vaping status: Never Used  Substance and Sexual Activity   Alcohol use: No   Drug use: No   Sexual activity: Yes    Birth control/protection: Surgical    Comment:  Hysterectomy  Other Topics Concern   Not on file  Social History Narrative   Right handed   No caffeine   One floor home   Currently employed   Lives with mom, son and nephew   Social Drivers of Health   Tobacco Use: Low Risk (08/05/2024)   Patient History    Smoking Tobacco Use: Never    Smokeless Tobacco Use: Never    Passive Exposure: Not on file  Financial Resource Strain: Low Risk (12/05/2021)   Received from [facility]   Overall Financial Resource Strain (CARDIA)    Difficulty of Paying Living Expenses: Not hard at all  Food Insecurity: No Food Insecurity (12/05/2021)   Received from [facility]   Epic    Within the past 12 months, you worried that your food would run out before you got the money to buy more.: Never true    Within the past 12 months, the food you bought just didn't last and you didn't have money to get more.: Never true  Transportation Needs: No Transportation Needs (12/05/2021)   Received from [facility]   PRAPARE - Transportation    Lack of Transportation (Medical): No    Lack of Transportation (Non-Medical): No  Physical Activity: Insufficiently Active (12/05/2021)   Received from [facility]   Exercise Vital Sign    On average, how many days per week do you engage in moderate to strenuous exercise (like a brisk walk)?: 3 days    On average, how many minutes do you engage in exercise at this level?: 30 min  Stress: No Stress Concern Present (12/05/2021)   Received from [facility] - Occupational Stress Questionnaire    Feeling of Stress : Only a little  Social Connections: Moderately Isolated (12/05/2021)   Received from [facility]   Social Connection and Isolation Panel    In a typical week, how many times do you talk on the phone with family, friends, or neighbors?: More than three times a week    How often do you get together with friends or relatives?: More than three times a week     How often do you attend church or religious services?: More than 4 times per year    Do you belong to any clubs or organizations such as church groups, unions, fraternal or athletic groups, or school groups?: No    How often do you attend meetings of the clubs or organizations you belong to?: Never    Are you married, widowed, divorced, separated, never married, or living with a partner?: Never married  Intimate Partner Violence: Not At Risk (12/05/2021)   Received from [facility]   Epic    Within the last year, have you been afraid of your partner or ex-partner?: No    Within the last year, have you been humiliated or emotionally abused in other ways by your partner or  ex-partner?: No    Within the last year, have you been kicked, hit, slapped, or otherwise physically hurt by your partner or ex-partner?: No    Within the last year, have you been raped or forced to have any kind of sexual activity by your partner or ex-partner?: No  Depression (PHQ2-9): Low Risk (06/13/2024)   Depression (PHQ2-9)    PHQ-2 Score: 0  Alcohol Screen: Not on file  Housing: Not on file  Utilities: Not on file  Health Literacy: Low Risk (12/05/2021)   Received from [facility]    How often do you need to have someone help you when you read instructions, pamphlets, or other written material from your doctor or pharmacy?: Never      Review of Systems  Constitutional:  Positive for fatigue. Negative for chills and unexpected weight change.  HENT:  Negative for congestion, postnasal drip, rhinorrhea, sneezing and sore throat.   Eyes:  Negative for redness.  Respiratory: Negative.  Negative for cough, chest tightness, shortness of breath and wheezing.   Cardiovascular: Negative.  Negative for chest pain and palpitations.  Gastrointestinal:  Negative for abdominal pain, constipation, diarrhea, nausea and vomiting.  Genitourinary:  Positive for menstrual problem. Negative for dysuria  and frequency.  Musculoskeletal:  Negative for arthralgias, back pain, joint swelling and neck pain.  Skin:  Negative for rash.  Neurological:  Positive for headaches. Negative for tremors and numbness.  Hematological:  Negative for adenopathy. Does not bruise/bleed easily.  Psychiatric/Behavioral:  Negative for behavioral problems (Depression), sleep disturbance and suicidal ideas. The patient is not nervous/anxious.     Vital Signs: BP 130/86   Temp (!) 96.9 F (36.1 C)   Resp 16   Ht 5' 4 (1.626 m)   Wt 208 lb (94.3 kg)   LMP 04/18/2016   BMI 35.70 kg/m    Physical Exam Vitals reviewed.  Constitutional:      General: She is not in acute distress.    Appearance: Normal appearance. She is obese. She is not ill-appearing.  HENT:     Head: Normocephalic and atraumatic.  Eyes:     Pupils: Pupils are equal, round, and reactive to light.  Cardiovascular:     Rate and Rhythm: Normal rate and regular rhythm.  Pulmonary:     Effort: Pulmonary effort is normal. No respiratory distress.  Skin:    General: Skin is warm and dry.     Capillary Refill: Capillary refill takes less than 2 seconds.  Neurological:     Mental Status: She is alert and oriented to person, place, and time.  Psychiatric:        Mood and Affect: Mood normal.        Behavior: Behavior normal.        Assessment/Plan: 1. Hyperhidrosis (Primary) Topical medication prescribed for excessive sweating under breasts.  - Sofpironium  Bromide 12.45 % GEL; Apply 1 Application topically daily. At bedtime to the skin under the breast for excessive sweating.  Dispense: 40.2 mL; Refill: 5  2. Bacterial vaginosis Nuswab vaginal swab sent to lab - NuSwab Vaginitis Plus (VG+)  3. Hepatic hemangioma No further intervention or follow up at this time.   4. Class 2 severe obesity due to excess calories with serious comorbidity and body mass index (BMI) of 35.0 to 35.9 in adult Wegovy  prescribed for patient. 4 week  sample given to patient for now. Will send prior auth request - semaglutide -weight management (WEGOVY ) 0.5 MG/0.5ML SOAJ SQ  injection; Inject 0.5 mg into the skin once a week.  Dispense: 2 mL; Refill: 5   General Counseling: Nishka verbalizes understanding of the findings of todays visit and agrees with plan of treatment. I have discussed any further diagnostic evaluation that may be needed or ordered today. We also reviewed her medications today. she has been encouraged to call the office with any questions or concerns that should arise related to todays visit.    Orders Placed This Encounter  Procedures   NuSwab Vaginitis Plus (VG+)    Meds ordered this encounter  Medications   semaglutide -weight management (WEGOVY ) 0.5 MG/0.5ML SOAJ SQ injection    Sig: Inject 0.5 mg into the skin once a week.    Dispense:  2 mL    Refill:  5    Dx code E66.01   Sofpironium  Bromide 12.45 % GEL    Sig: Apply 1 Application topically daily. At bedtime to the skin under the breast for excessive sweating.    Dispense:  40.2 mL    Refill:  5    Fill new script today    Return in about 1 month (around 09/05/2024) for F/U, eval new med, Ryett Hamman PCP.   Total time spent:30 Minutes Time spent includes review of chart, medications, test results, and follow up plan with the patient.   [place] Controlled Substance Database was reviewed by me.  This patient was seen by Mardy Maxin, FNP-C in collaboration with Dr. Sigrid Bathe as a part of collaborative care agreement.   Loveta Dellis R. Maxin, MSN, FNP-C Internal medicine

## 2024-08-10 ENCOUNTER — Telehealth: Payer: Self-pay

## 2024-08-10 NOTE — Telephone Encounter (Signed)
 Patient notified that the Wegovy  was approved.

## 2024-08-11 ENCOUNTER — Ambulatory Visit: Payer: Self-pay | Admitting: Nurse Practitioner

## 2024-08-11 LAB — NUSWAB VAGINITIS PLUS (VG+)
Chlamydia trachomatis, NAA: NEGATIVE
Neisseria gonorrhoeae, NAA: NEGATIVE
Trich vag by NAA: NEGATIVE

## 2024-08-11 NOTE — Progress Notes (Signed)
 "  Virtual Visit via Video Note:   Consent was obtained for video visit:  Yes.   Answered questions that patient had about telehealth interaction:  Yes.   I discussed the limitations, risks, security and privacy concerns of performing an evaluation and management service by telemedicine. I also discussed with the patient that there may be a patient responsible charge related to this service. The patient expressed understanding and agreed to proceed.  Pt location: Home Physician Location: office Name of referring provider:  Center, [facility] I connected with Danielle Pollard at patients initiation/request on 08/15/2024 at  9:50 AM EST by video enabled telemedicine application and verified that I am speaking with the correct person using two identifiers. Pt MRN:  [id number] Pt DOB:  [birth year] Video Participants:  Danielle Pollard  Assessment/Plan:   Migraine without aura, without status migrainosus, not intractable - aggravated presumably from weather (frequent rain/storms)   Migraine prevention: Aimovig  140mg   Migraine rescue: Ubrelvy  100mg  and Zofran  4mg  Limit use of pain relievers to no more than 9 days out of the month to prevent risk of rebound or medication-overuse headache.  Keep headache diary  Exercise, hydration, caffeine cessation, sleep hygiene, monitor for and avoid triggers  Follow up 6 months.  Subjective:  Danielle Pollard is a 39 year old woman with migraines, asthma and sickle cell trait who follows up for migraines.   UPDATE:.  Due to uncontrolled headaches, Emgality  was changed to Aimovig .  She is doing well.   Intensity:  severe Duration:  2 hours with Ubrelvy , even if she wakes up with migraine; rizatriptan  ineffective. Frequency:  1 or 2 in the past 6 months.     Current NSAIDS:  ibuprofen  Current analgesics:  acetaminophen  Current triptans:  rizatriptan  10mg  Current ergotamine:  none Current anti-emetic:  none Current muscle  relaxants:  tizanidine Current anti-anxiolytic:  none Current sleep aide:  none Current Antihypertensive medications:  none Current Antidepressant medications:  Cymbalta 30mg  daily Current Anticonvulsant medications:  none Current anti-CGRP:  Aimovig  140mg , Ubrelvy  100mg  Current Vitamins/Herbal/Supplements:  none Current Antihistamines/Decongestants:  none Other therapy:  none Hormone/birth control:  none   Caffeine:  No coffee.  No recent soda. Diet:  Hydrates.  Does not skip meals. Exercise:  routine Depression:  no; Anxiety:  no Other pain:  no Sleep hygiene:  Good.   HISTORY:  Onset:  [employer] school Location:  Unilateral (either side) or bifrontal Quality:  Pressure, squeezing, throbbing, pounding Initial Intensity:  10/10.  She denies new headache, thunderclap headache Aura:  no Premonitory Phase:  no Postdrome:  Fatigue or linger dull headache for 1 to 2 days Associated symptoms:  Photophobia, phonophobia, nausea, vomiting, blurred vision, dizzy when severe.  She denies associated unilateral numbness or weakness. Initial Duration:  2-3 days up to a week Initial Frequency:  Frequent until hysterectomy last year and since then once a month (from 2-3 days up to a week) Frequency of abortive medication: usually 3 days a month Triggers:  Unknown Relieving factors:  Sleep in dark and quiet room, laying on a cold floor Activity:  aggravates   Headaches have been worked up with 2 head CTs.  CT Head from 12/17/11 reportedly was normal.  CT head from 06/07/15 was personally reviewed and was also unremarkable.      Past NSAIDS:  naproxen, Mobic  Past analgesics:  Excedrin Past abortive triptans:  Sumatriptan  100mg , sumatriptan  20mg  NS (aggravated nausea) Past abortive ergotamine:  none Past muscle relaxants:  none Past  anti-emetic:  Reglan , Zofran , Phenergan  Past antihypertensive medications:  none Past antidepressant medications:  nortriptyline Past anticonvulsant medications:   Topiramate, gabapentin  (for nerve pain) Past anti-CGRP:  Emgality  Past vitamins/Herbal/Supplements:  none Past antihistamines/decongestants:  none Other past therapies: Depo-Prov              Family history of headache:  Maternal aunt  Past Medical History: Past Medical History:  Diagnosis Date   Anemia    Asthma    WELL CONTROLLED-LAST USED INHALER IN 2012   Migraines    MIGRAINES   Obesity, unspecified 04/18/2014   Sickle cell trait     Medications: Outpatient Encounter Medications as of 08/15/2024  Medication Sig   Erenumab -aooe (AIMOVIG ) 140 MG/ML SOAJ Inject 140 mg into the skin every 28 (twenty-eight) days.   folic acid  (FOLVITE ) 1 MG tablet Take 1 tablet (1 mg total) by mouth daily.   linaclotide  (LINZESS ) 72 MCG capsule Take 1 capsule (72 mcg total) by mouth daily before breakfast.   ondansetron  (ZOFRAN -ODT) 4 MG disintegrating tablet Take 1 tablet (4 mg total) by mouth every 8 (eight) hours as needed for nausea or vomiting.   rizatriptan  (MAXALT ) 10 MG tablet TAKE 1 TABLET BY MOUTH. MAY REPEAT IN 2 HOURS IF NEEDED (Patient taking differently: Take 10 mg by mouth as needed for migraine. TAKE 1 TABLET BY MOUTH. MAY REPEAT IN 2 HOURS IF NEEDED)   semaglutide -weight management (WEGOVY ) 0.5 MG/0.5ML SOAJ SQ injection Inject 0.5 mg into the skin once a week.   Sofpironium  Bromide 12.45 % GEL Apply 1 Application topically daily. At bedtime to the skin under the breast for excessive sweating.   Ubrogepant  (UBRELVY ) 100 MG TABS Take 1 tablet (100 mg total) by mouth as needed. May repeat after 2 hours.  Maximum 2 tablets in 24 hours.   Vitamin D , Ergocalciferol , (DRISDOL ) 1.25 MG (50000 UNIT) CAPS capsule Take 1 capsule (50,000 Units total) by mouth every 7 (seven) days.   No facility-administered encounter medications on file as of 08/15/2024.    Allergies: Allergies[1]  Family History: Family History  Problem Relation Age of Onset   Hypertension Maternal Grandmother     Hypertension Maternal Grandfather    Breast cancer Neg Hx     Observations/Objective:   No acute distress.  Alert and oriented.  Speech fluent and not dysarthric.  Language intact.  Eyes orthophoric on primary gaze.  Face symmetric.   Follow up Instructions:      -I discussed the assessment and treatment plan with the patient. The patient was provided an opportunity to ask questions and all were answered. The patient agreed with the plan and demonstrated an understanding of the instructions.   The patient was advised to call back or seek an in-person evaluation if the symptoms worsen or if the condition fails to improve as anticipated.   Juliene Lamar Dunnings, DO CC: Mardy Maxin, NP         [1] No Known Allergies  "

## 2024-08-11 NOTE — Progress Notes (Signed)
 Vaginal swab is negative

## 2024-08-11 NOTE — Telephone Encounter (Signed)
-----   Message from Mardy Maxin, NP sent at 08/11/2024  9:52 AM EST ----- Vaginal swab is negative

## 2024-08-11 NOTE — Telephone Encounter (Signed)
 Patient notified.

## 2024-08-15 ENCOUNTER — Telehealth: Admitting: Neurology

## 2024-08-15 ENCOUNTER — Encounter: Payer: Self-pay | Admitting: Neurology

## 2024-08-15 DIAGNOSIS — G43009 Migraine without aura, not intractable, without status migrainosus: Secondary | ICD-10-CM | POA: Diagnosis not present

## 2024-08-15 MED ORDER — UBRELVY 100 MG PO TABS: 1.0000 | ORAL_TABLET | ORAL | 5 refills | Status: AC | PRN

## 2024-08-23 ENCOUNTER — Telehealth: Payer: Self-pay

## 2024-08-23 NOTE — Telephone Encounter (Signed)
 PA needed for Aimovig.

## 2024-08-26 ENCOUNTER — Other Ambulatory Visit (HOSPITAL_COMMUNITY): Payer: Self-pay

## 2024-08-26 ENCOUNTER — Telehealth: Payer: Self-pay | Admitting: Pharmacy Technician

## 2024-08-26 NOTE — Telephone Encounter (Signed)
 PA has been submitted, and telephone encounter has been created. Please see telephone encounter dated 1.30.26.

## 2024-08-26 NOTE — Telephone Encounter (Signed)
 Pharmacy Patient Advocate Encounter   Received notification from Pt Calls Messages that prior authorization for AIMOVIG  140MG  is required/requested.   Insurance verification completed.   The patient is insured through [facility] MEDICAID.   Per test claim: PA required; PA submitted to above mentioned insurance via Latent Key/confirmation #/EOC [id number] Status is pending

## 2024-08-29 ENCOUNTER — Other Ambulatory Visit (HOSPITAL_COMMUNITY): Payer: Self-pay

## 2024-08-29 NOTE — Telephone Encounter (Signed)
 Pharmacy Patient Advocate Encounter  Received notification from [facility] MEDICAID that Prior Authorization for AIMOVIG  140MG  has been APPROVED from 1.30.26 to 1.30.27. Ran test claim, Copay is $4. This test claim was processed through [facility]- copay amounts may vary at other pharmacies due to pharmacy/plan contracts, or as the patient moves through the different stages of their insurance plan.   PA #/Case ID/Reference #: [id number]

## 2024-09-01 ENCOUNTER — Ambulatory Visit

## 2024-09-07 ENCOUNTER — Ambulatory Visit: Admitting: Nurse Practitioner

## 2024-11-24 ENCOUNTER — Ambulatory Visit: Admitting: Nurse Practitioner

## 2025-02-22 ENCOUNTER — Ambulatory Visit: Payer: Self-pay | Admitting: Neurology
